# Patient Record
Sex: Female | Born: 1950
Health system: Southern US, Community
[De-identification: ages and names within clinical notes are randomized; demographics above are authoritative.]

## PROBLEM LIST (undated history)

## (undated) DIAGNOSIS — J45909 Unspecified asthma, uncomplicated: Secondary | ICD-10-CM

## (undated) DIAGNOSIS — D869 Sarcoidosis, unspecified: Secondary | ICD-10-CM

## (undated) DIAGNOSIS — T7840XA Allergy, unspecified, initial encounter: Secondary | ICD-10-CM

## (undated) DIAGNOSIS — F419 Anxiety disorder, unspecified: Secondary | ICD-10-CM

## (undated) DIAGNOSIS — J309 Allergic rhinitis, unspecified: Secondary | ICD-10-CM

## (undated) DIAGNOSIS — K219 Gastro-esophageal reflux disease without esophagitis: Secondary | ICD-10-CM

## (undated) DIAGNOSIS — I10 Essential (primary) hypertension: Secondary | ICD-10-CM

## (undated) HISTORY — DX: Unspecified asthma, uncomplicated: J45.909

## (undated) HISTORY — DX: Essential (primary) hypertension: I10

## (undated) HISTORY — PX: DEBRIDEMENT TOE: SUR397

## (undated) HISTORY — PX: TONSILLECTOMY: SUR1361

## (undated) HISTORY — DX: Gastro-esophageal reflux disease without esophagitis: K21.9

## (undated) HISTORY — PX: EXTERNAL FIXATION OF FINGER: SHX6745

## (undated) HISTORY — DX: Allergy, unspecified, initial encounter: T78.40XA

## (undated) HISTORY — DX: Sarcoidosis, unspecified: D86.9

## (undated) HISTORY — DX: Anxiety disorder, unspecified: F41.9

## (undated) HISTORY — DX: Allergic rhinitis, unspecified: J30.9

---

## 1998-09-06 ENCOUNTER — Emergency Department (HOSPITAL_COMMUNITY): Admission: EM | Admit: 1998-09-06 | Discharge: 1998-09-07 | Payer: Self-pay | Admitting: Emergency Medicine

## 1998-09-15 ENCOUNTER — Other Ambulatory Visit: Admission: RE | Admit: 1998-09-15 | Discharge: 1998-09-15 | Payer: Self-pay | Admitting: *Deleted

## 1999-09-14 ENCOUNTER — Other Ambulatory Visit: Admission: RE | Admit: 1999-09-14 | Discharge: 1999-09-14 | Payer: Self-pay | Admitting: *Deleted

## 2000-09-12 ENCOUNTER — Other Ambulatory Visit: Admission: RE | Admit: 2000-09-12 | Discharge: 2000-09-12 | Payer: Self-pay | Admitting: *Deleted

## 2001-09-11 ENCOUNTER — Other Ambulatory Visit: Admission: RE | Admit: 2001-09-11 | Discharge: 2001-09-11 | Payer: Self-pay | Admitting: *Deleted

## 2002-05-06 ENCOUNTER — Encounter: Payer: Self-pay | Admitting: Family Medicine

## 2002-05-06 ENCOUNTER — Encounter: Admission: RE | Admit: 2002-05-06 | Discharge: 2002-05-06 | Payer: Self-pay | Admitting: Family Medicine

## 2002-08-21 ENCOUNTER — Other Ambulatory Visit: Admission: RE | Admit: 2002-08-21 | Discharge: 2002-08-21 | Payer: Self-pay | Admitting: *Deleted

## 2003-08-22 ENCOUNTER — Other Ambulatory Visit: Admission: RE | Admit: 2003-08-22 | Discharge: 2003-08-22 | Payer: Self-pay | Admitting: *Deleted

## 2003-11-25 ENCOUNTER — Ambulatory Visit (HOSPITAL_COMMUNITY): Admission: RE | Admit: 2003-11-25 | Discharge: 2003-11-25 | Payer: Self-pay | Admitting: Internal Medicine

## 2003-11-25 IMAGING — CT CT PARANASAL SINUSES LIMITED
1 series · 16 of 20 positions shown, 20 images · non-contrast
Comparison: none

CLINICAL DATA: Sinusitis.  Facial pain.  Post nasal drip, coughing, dizziness.  
CT MAXILLOFACIAL LIMITED WITHOUT CONTRAST
2.5 mm collimated images were obtained with the patient in the coronal plane.  Bilateral air-fluid levels are seen in the maxillary sinuses consistent with acute sinusitis.  There is considerable fluid accumulation in the ethmoid air cells as well.  Sphenoid sinus is essentially clear.  The frontal sinus is hypoplastic but does not contain significant fluid accumulation.  
IMPRESSION
1.  Acute bilateral maxillary sinusitis.
2.  Moderate ethmoid sinus disease is noted as well.

[Series 3: recon 2: limited sinus prone · axial · 0.33mm/px · z∈[+137,+224]mm · 16 of 20 slices shown, 20 images]
[im 2/20  brain]
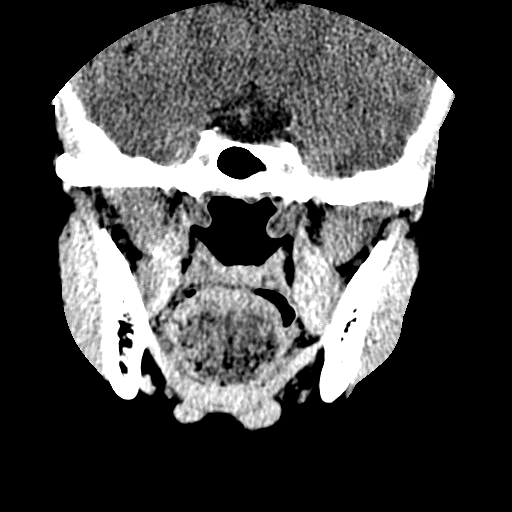
[im 2/20  bone]
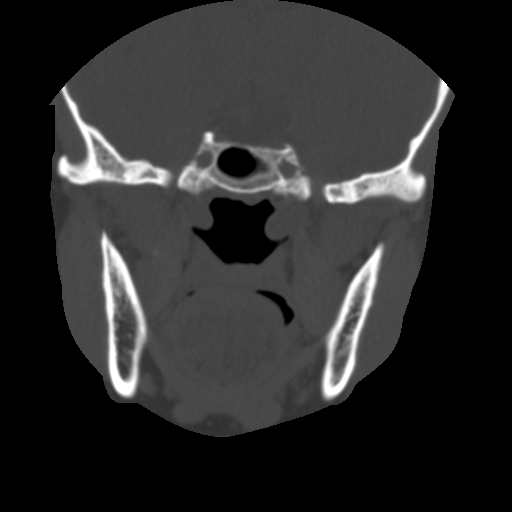
[im 3/20  bone]
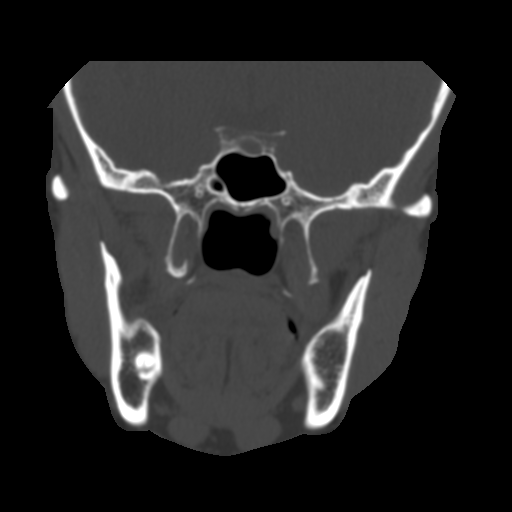
[im 4/20  bone]
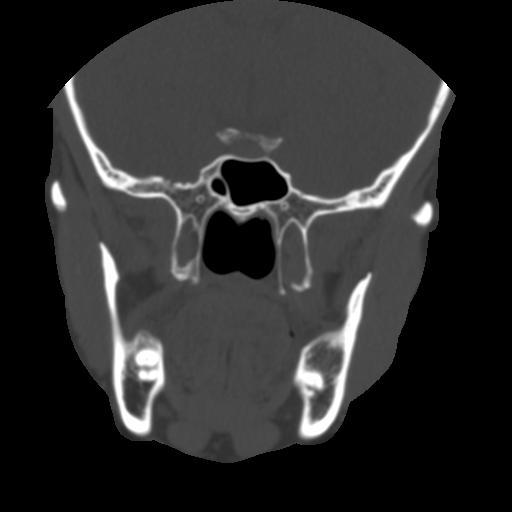
[im 5/20  bone]
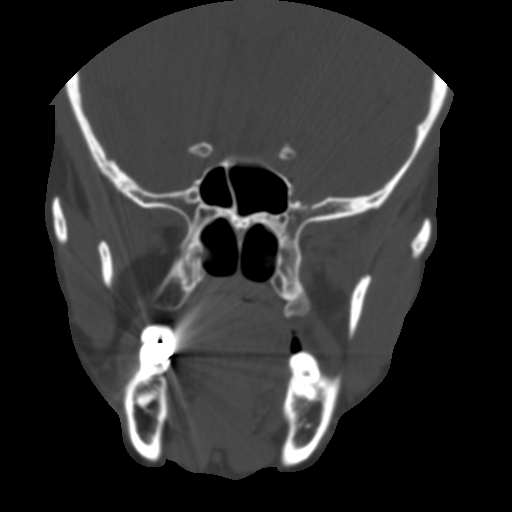
[im 7/20  brain]
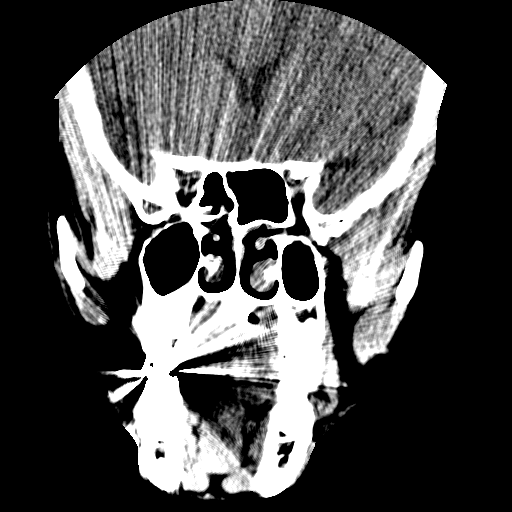
[im 7/20  bone]
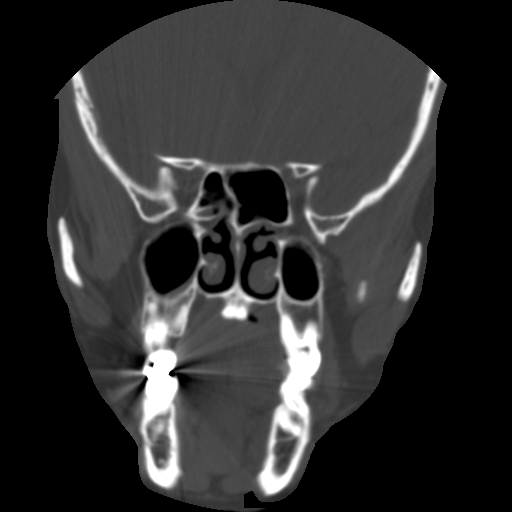
[im 8/20  bone]
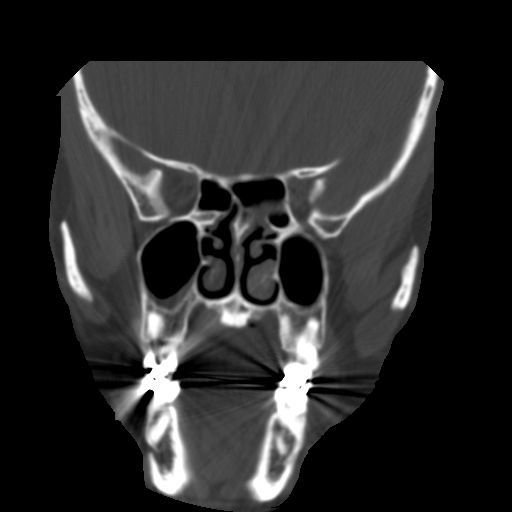
[im 9/20  bone]
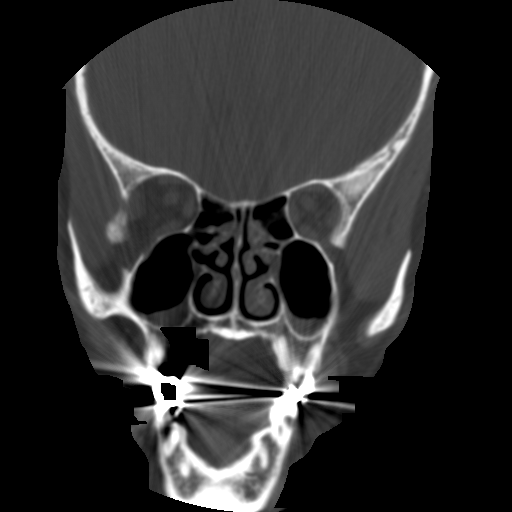
[im 10/20  bone]
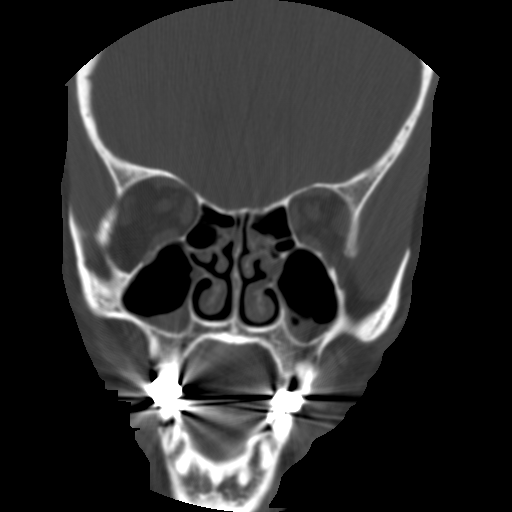
[im 11/20  brain]
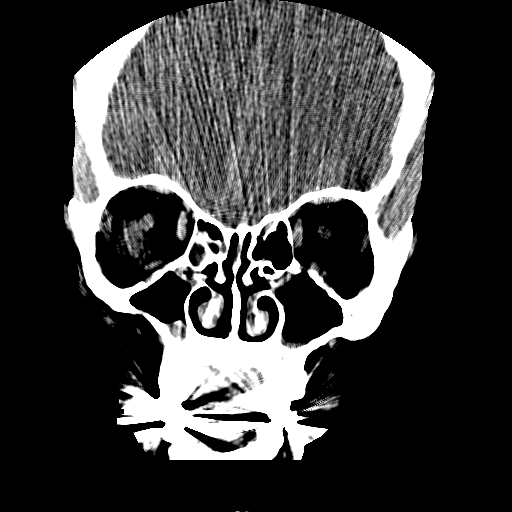
[im 11/20  bone]
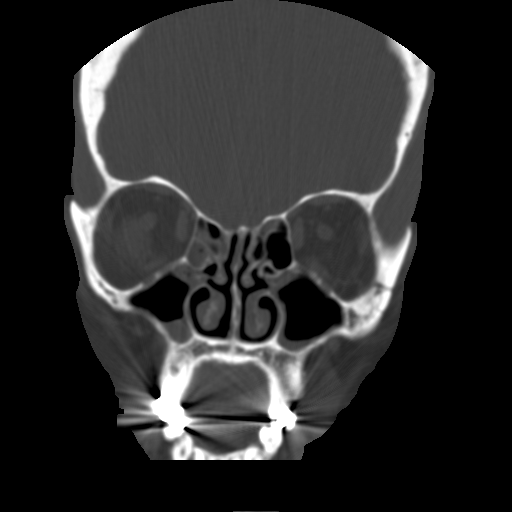
[im 12/20  bone]
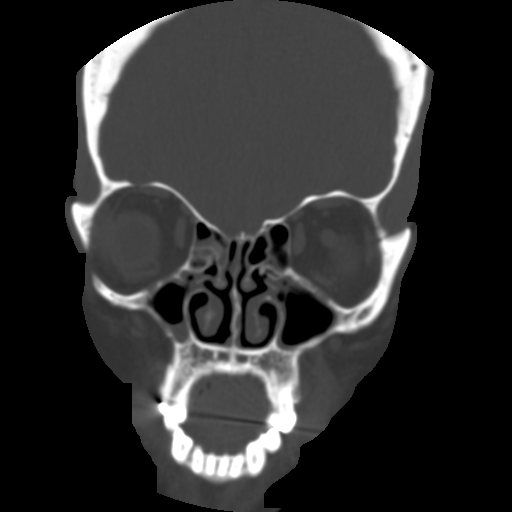
[im 13/20  bone]
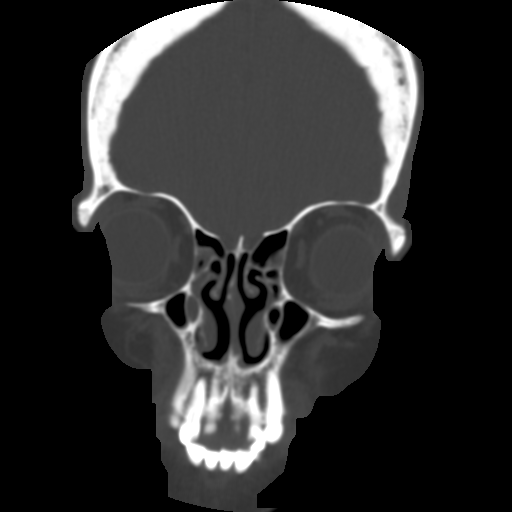
[im 14/20  bone]
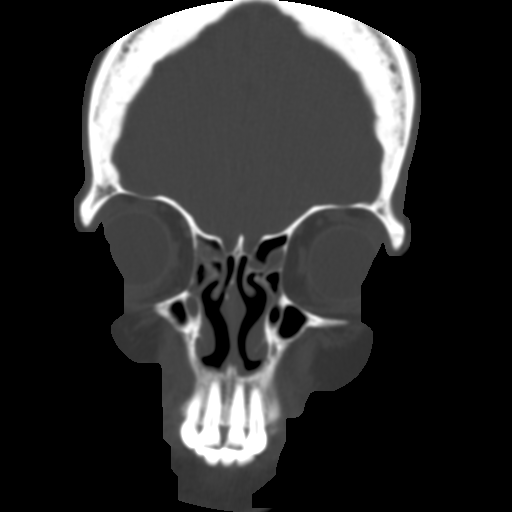
[im 16/20  brain]
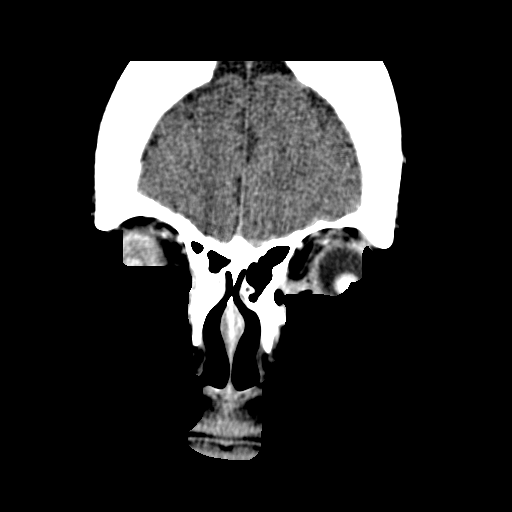
[im 16/20  bone]
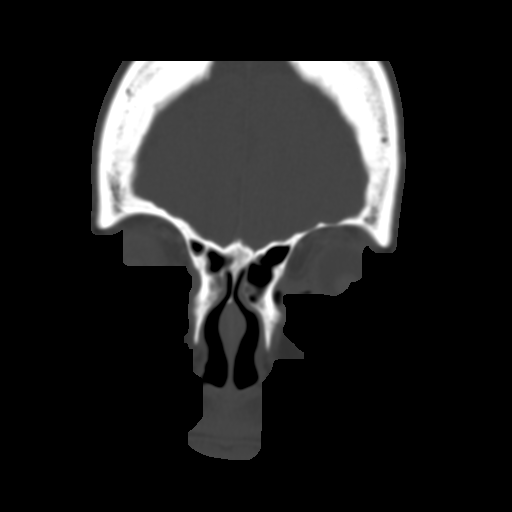
[im 17/20  bone]
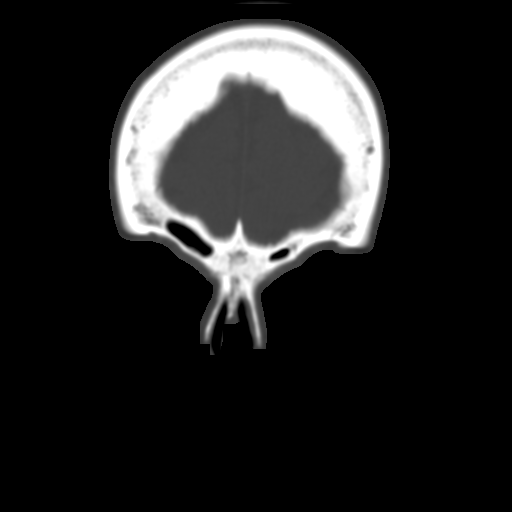
[im 18/20  bone]
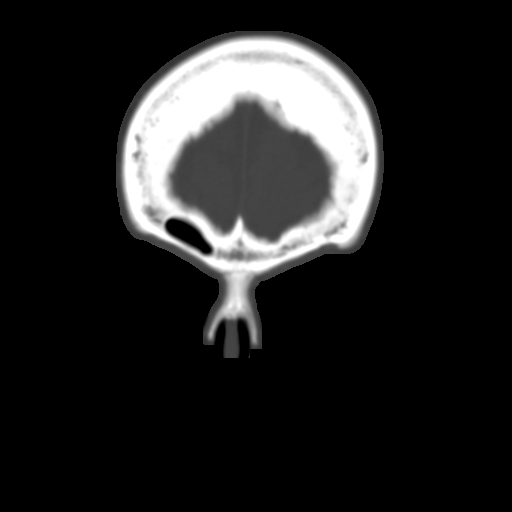
[im 19/20  bone]
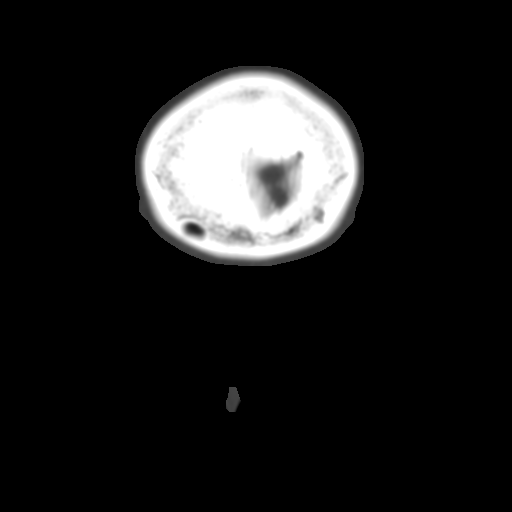

[16 of 20 positions shown; findings below may reference images not displayed]

## 2004-02-13 ENCOUNTER — Ambulatory Visit (HOSPITAL_COMMUNITY): Admission: RE | Admit: 2004-02-13 | Discharge: 2004-02-13 | Payer: Self-pay | Admitting: Internal Medicine

## 2004-03-27 ENCOUNTER — Ambulatory Visit: Payer: Self-pay | Admitting: Internal Medicine

## 2004-04-10 ENCOUNTER — Ambulatory Visit: Payer: Self-pay | Admitting: Internal Medicine

## 2004-07-09 ENCOUNTER — Ambulatory Visit: Payer: Self-pay | Admitting: Internal Medicine

## 2004-08-27 ENCOUNTER — Other Ambulatory Visit: Admission: RE | Admit: 2004-08-27 | Discharge: 2004-08-27 | Payer: Self-pay | Admitting: *Deleted

## 2004-09-21 ENCOUNTER — Encounter: Admission: RE | Admit: 2004-09-21 | Discharge: 2004-12-20 | Payer: Self-pay | Admitting: Neurology

## 2004-10-09 ENCOUNTER — Ambulatory Visit: Payer: Self-pay | Admitting: Internal Medicine

## 2005-01-28 ENCOUNTER — Ambulatory Visit: Payer: Self-pay | Admitting: Internal Medicine

## 2005-02-03 ENCOUNTER — Ambulatory Visit: Payer: Self-pay | Admitting: Internal Medicine

## 2005-09-07 ENCOUNTER — Other Ambulatory Visit: Admission: RE | Admit: 2005-09-07 | Discharge: 2005-09-07 | Payer: Self-pay | Admitting: *Deleted

## 2006-07-18 ENCOUNTER — Ambulatory Visit: Payer: Self-pay | Admitting: Gastroenterology

## 2006-07-28 ENCOUNTER — Ambulatory Visit: Payer: Self-pay | Admitting: Gastroenterology

## 2006-08-25 ENCOUNTER — Other Ambulatory Visit: Admission: RE | Admit: 2006-08-25 | Discharge: 2006-08-25 | Payer: Self-pay | Admitting: *Deleted

## 2007-08-28 ENCOUNTER — Other Ambulatory Visit: Admission: RE | Admit: 2007-08-28 | Discharge: 2007-08-28 | Payer: Self-pay | Admitting: Gynecology

## 2008-06-28 ENCOUNTER — Encounter: Admission: RE | Admit: 2008-06-28 | Discharge: 2008-06-28 | Payer: Self-pay | Admitting: Family Medicine

## 2008-06-28 IMAGING — CR DG FINGER THUMB 2+V*R*
3 series · 3 of 3 positions shown · non-contrast
Comparison: None

CLINICAL DATA: Pain, no acute injury

RIGHT THUMB 2+V

[x finger pa right]
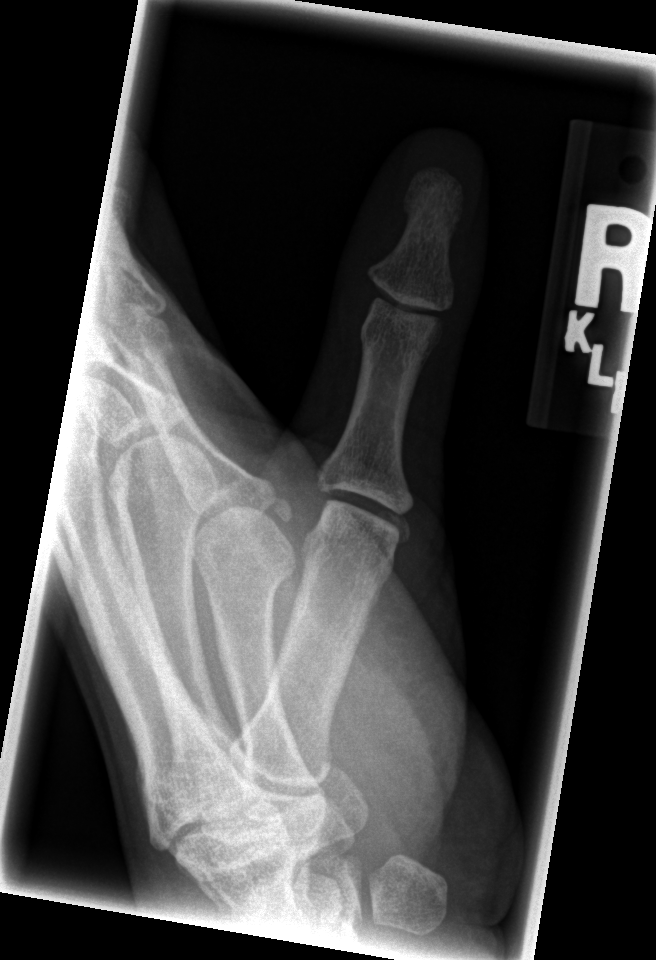

[x finger obl. right]
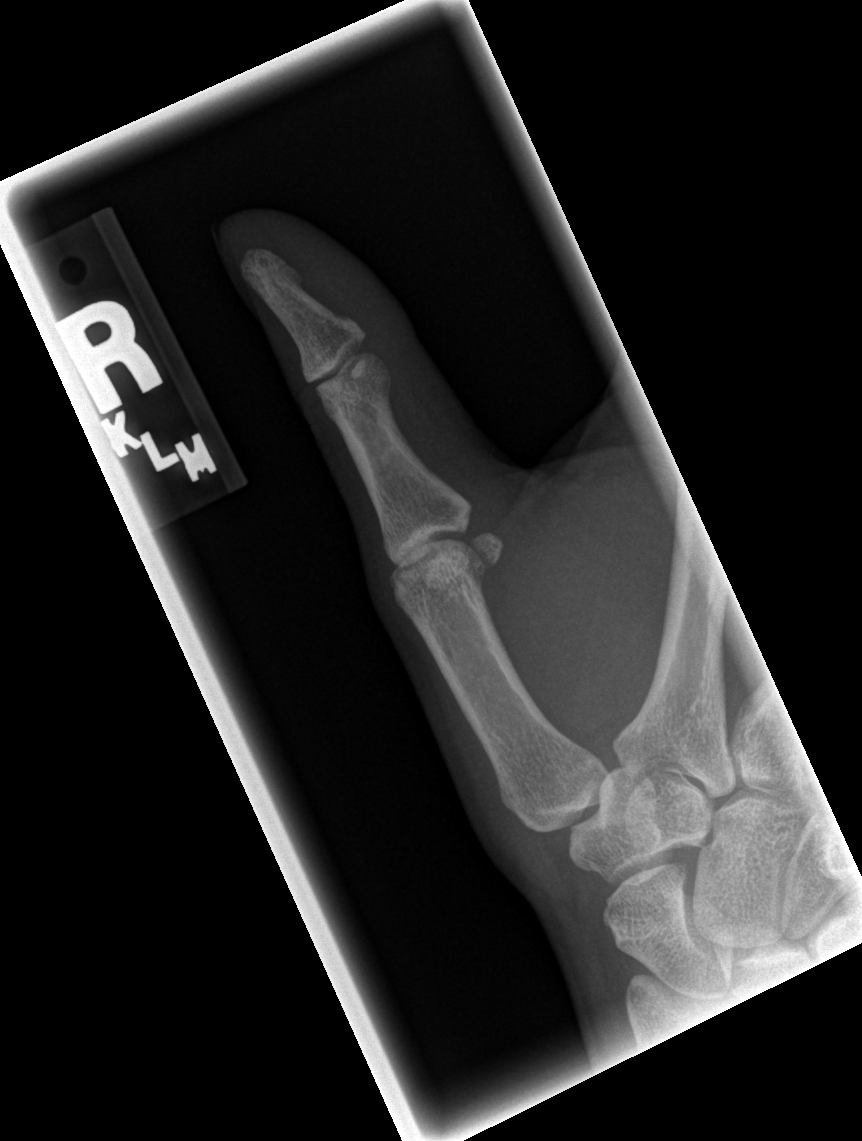

[x finger lateral right]
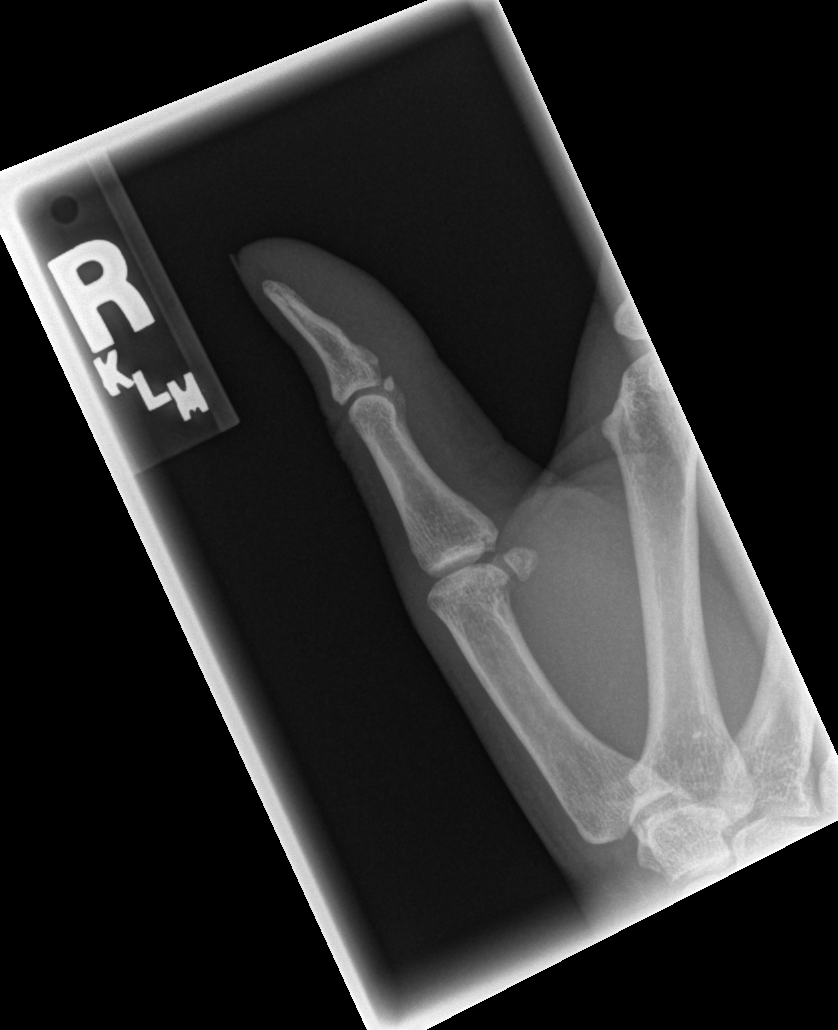

[3 of 3 positions shown; findings below may reference images not displayed]

FINDINGS: Joint spaces are normal.  No erosion is seen.  No
fracture is noted.  Alignment is normal.
IMPRESSION: Negative right thumb.

## 2008-08-08 ENCOUNTER — Ambulatory Visit (HOSPITAL_BASED_OUTPATIENT_CLINIC_OR_DEPARTMENT_OTHER): Admission: RE | Admit: 2008-08-08 | Discharge: 2008-08-08 | Payer: Self-pay | Admitting: Orthopedic Surgery

## 2009-01-24 ENCOUNTER — Ambulatory Visit (HOSPITAL_BASED_OUTPATIENT_CLINIC_OR_DEPARTMENT_OTHER): Admission: RE | Admit: 2009-01-24 | Discharge: 2009-01-24 | Payer: Self-pay | Admitting: Orthopedic Surgery

## 2009-04-29 ENCOUNTER — Ambulatory Visit: Payer: Self-pay | Admitting: Internal Medicine

## 2009-04-29 DIAGNOSIS — J209 Acute bronchitis, unspecified: Secondary | ICD-10-CM | POA: Insufficient documentation

## 2009-04-29 DIAGNOSIS — J310 Chronic rhinitis: Secondary | ICD-10-CM | POA: Insufficient documentation

## 2009-04-29 HISTORY — DX: Acute bronchitis, unspecified: J20.9

## 2009-04-29 IMAGING — CR DG CHEST 2V
2 series · 2 of 2 positions shown · non-contrast
Comparison: Digitized study dated [DATE]

CLINICAL DATA: Cough, shortness of breath, chest pain.  History of
hypertension, asthma.

CHEST - 2 VIEW

[view not recorded (1 of 2)]
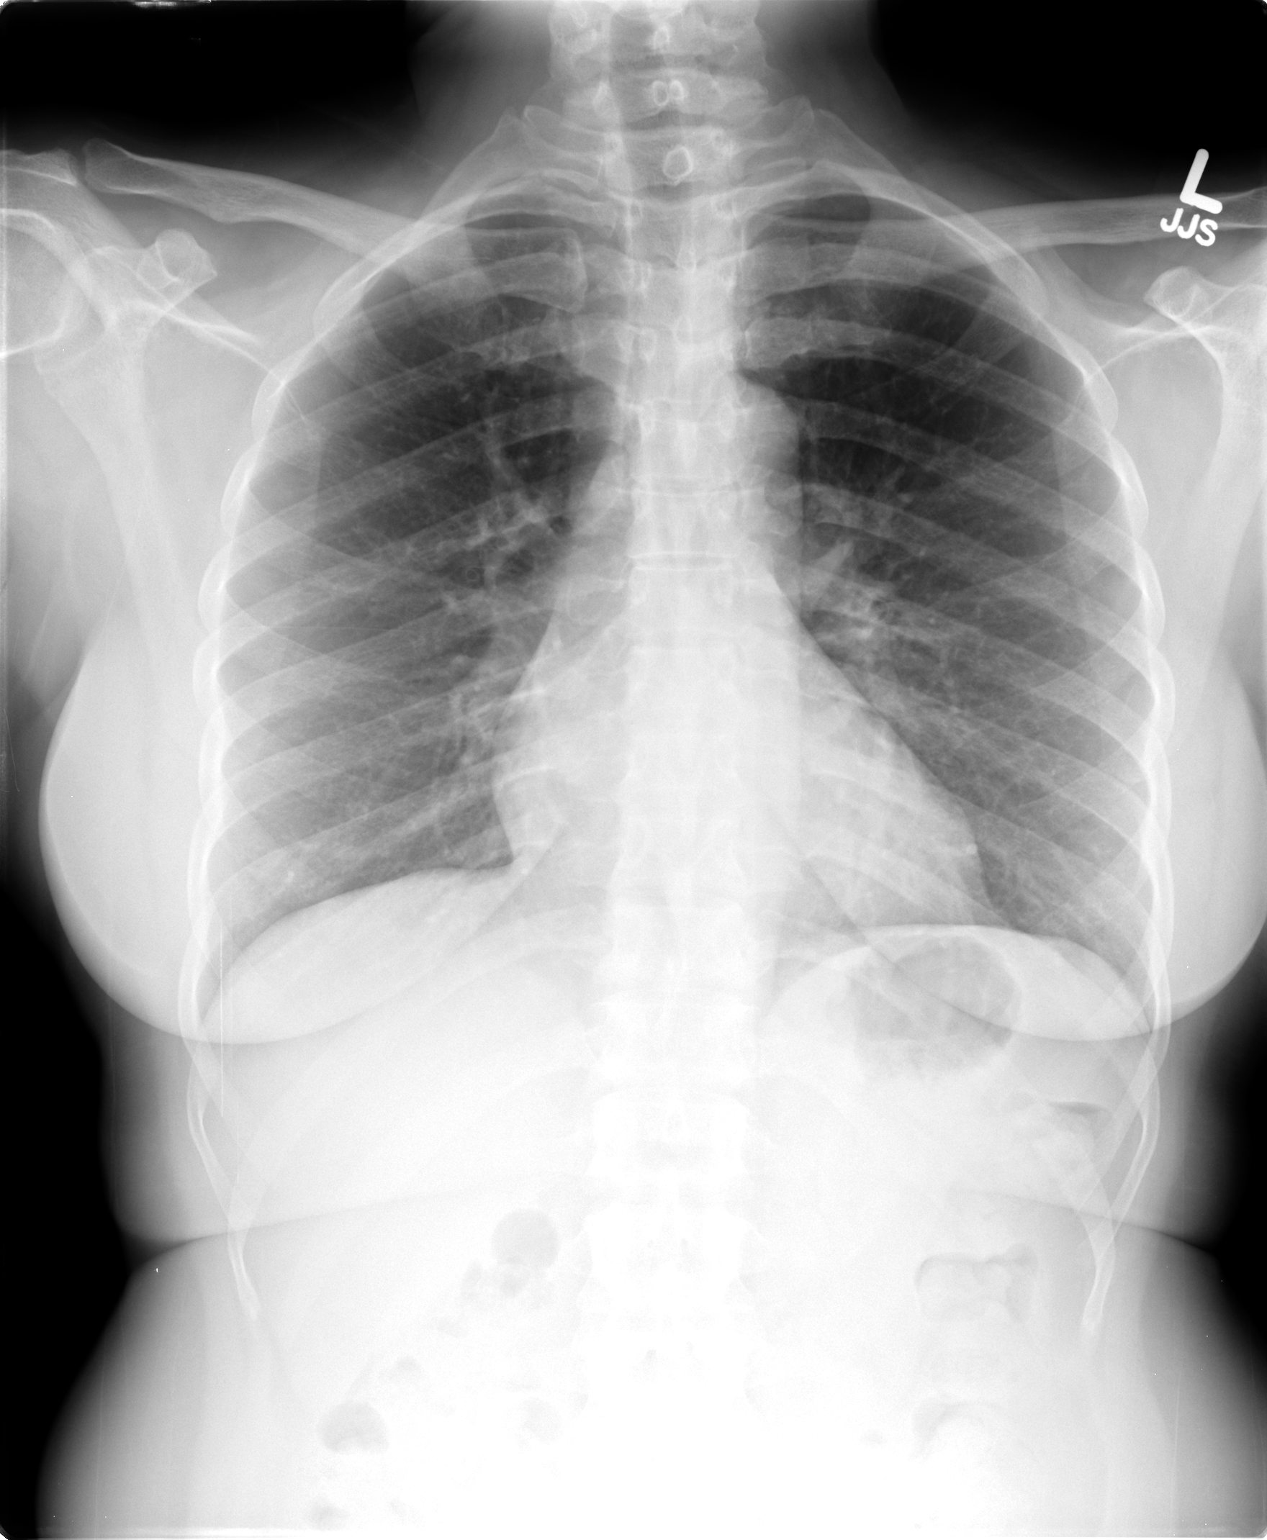

[view not recorded (2 of 2)]
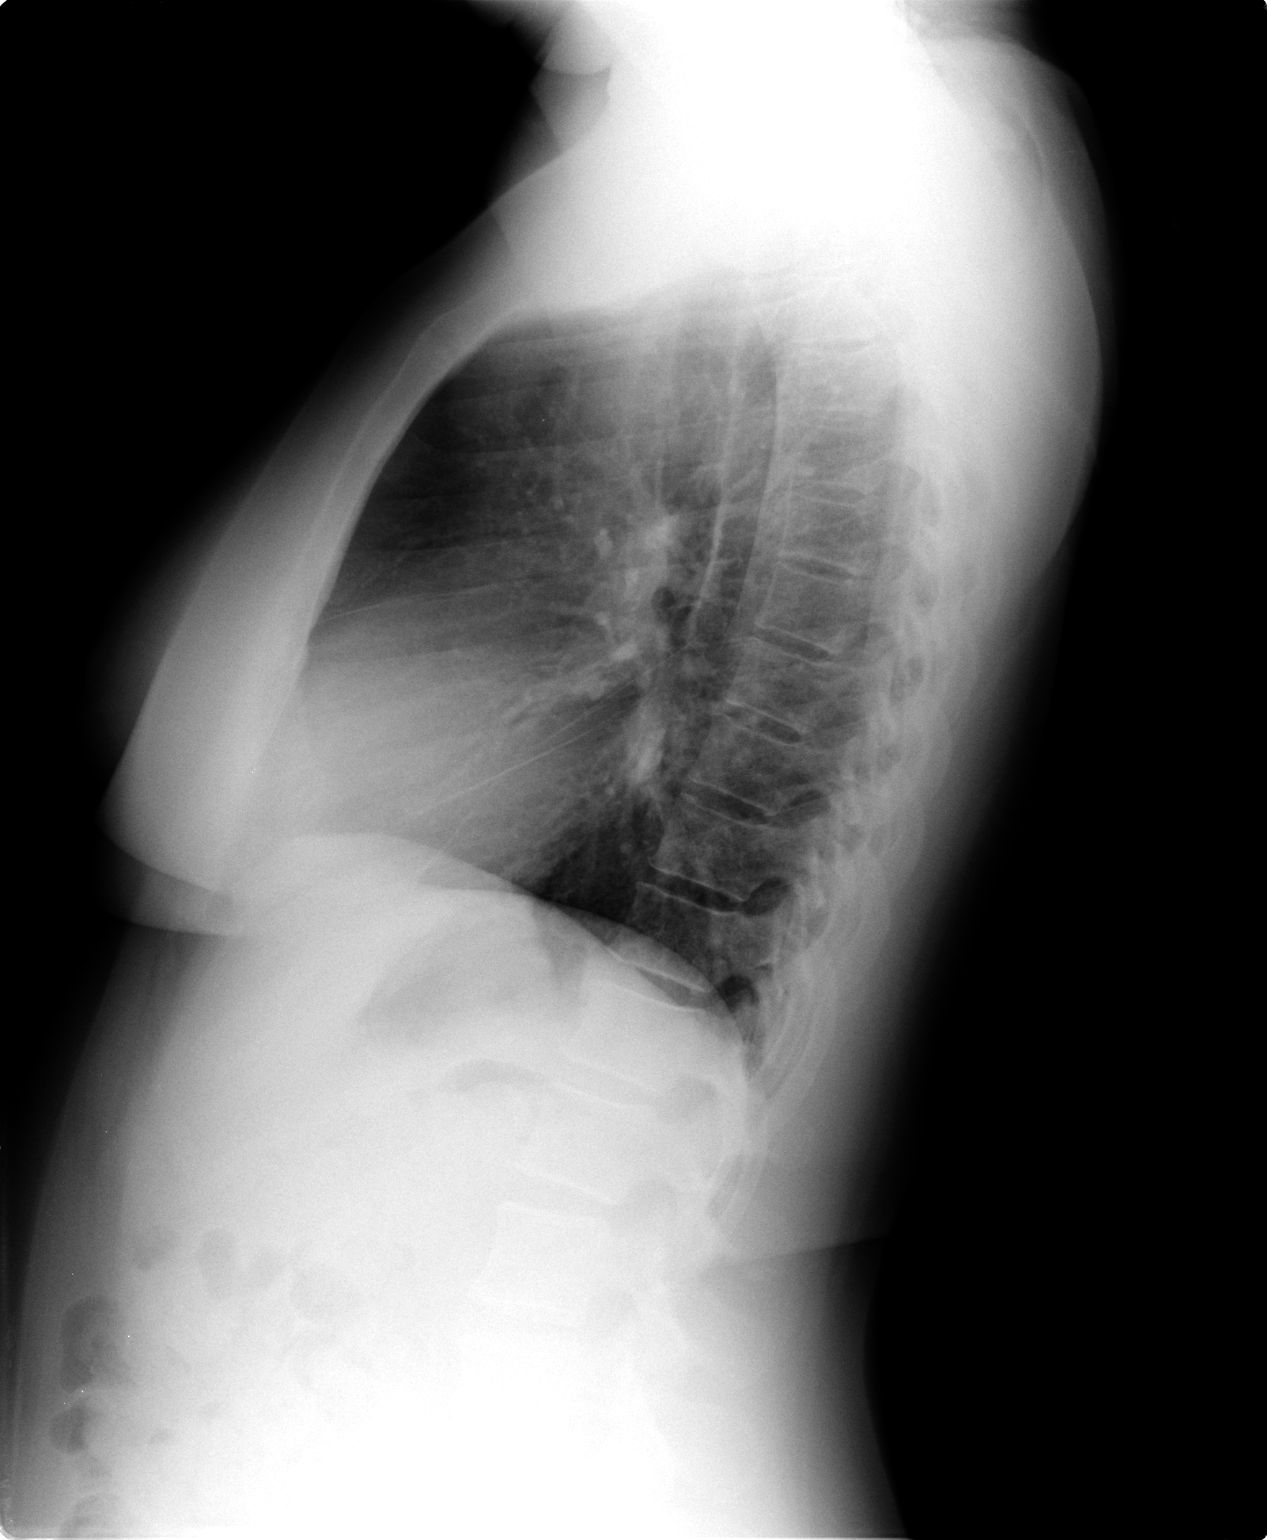

[2 of 2 positions shown; findings below may reference images not displayed]

FINDINGS: Heart size is normal.  There are no focal consolidations
or pleural effusions.  No evidence for pulmonary edema.  Visualized
bony structures have a normal appearance.
IMPRESSION: Normal chest.

## 2009-05-05 ENCOUNTER — Telehealth (INDEPENDENT_AMBULATORY_CARE_PROVIDER_SITE_OTHER): Payer: Self-pay | Admitting: *Deleted

## 2009-05-05 DIAGNOSIS — J309 Allergic rhinitis, unspecified: Secondary | ICD-10-CM | POA: Insufficient documentation

## 2009-05-06 LAB — CONVERTED CEMR LAB: Angiotensin 1 Converting Enzyme: 56 units/L (ref 9–67)

## 2009-05-07 ENCOUNTER — Telehealth (INDEPENDENT_AMBULATORY_CARE_PROVIDER_SITE_OTHER): Payer: Self-pay | Admitting: *Deleted

## 2009-05-20 ENCOUNTER — Ambulatory Visit: Payer: Self-pay | Admitting: Vascular Surgery

## 2009-05-20 ENCOUNTER — Ambulatory Visit (HOSPITAL_COMMUNITY): Admission: RE | Admit: 2009-05-20 | Discharge: 2009-05-20 | Payer: Self-pay | Admitting: Sports Medicine

## 2009-05-20 ENCOUNTER — Encounter (INDEPENDENT_AMBULATORY_CARE_PROVIDER_SITE_OTHER): Payer: Self-pay | Admitting: Sports Medicine

## 2009-05-22 ENCOUNTER — Ambulatory Visit: Payer: Self-pay | Admitting: Internal Medicine

## 2009-06-19 ENCOUNTER — Ambulatory Visit: Payer: Self-pay | Admitting: Internal Medicine

## 2009-07-18 ENCOUNTER — Ambulatory Visit: Payer: Self-pay | Admitting: Internal Medicine

## 2009-07-18 DIAGNOSIS — J45909 Unspecified asthma, uncomplicated: Secondary | ICD-10-CM

## 2009-08-12 ENCOUNTER — Ambulatory Visit: Payer: Self-pay | Admitting: Internal Medicine

## 2009-08-12 IMAGING — CT CT PARANASAL SINUSES LIMITED
1 series · 16 of 24 positions shown, 20 images · non-contrast
Comparison: [DATE].

CLINICAL DATA: 58-year-old female with frontal headache, productive
cough, postnasal drip.

CT PARANASAL SINUS LIMITED WITHOUT CONTRAST
TECHNIQUE: Multidetector CT images of the paranasal sinuses were
obtained in a single plane without contrast.

[Series 3: ltd sinus 3.0 h30s · axial · 0.20mm/px · z∈[+1286,+1384]mm · 16 of 24 slices shown, 20 images]
[im 2/24  brain]
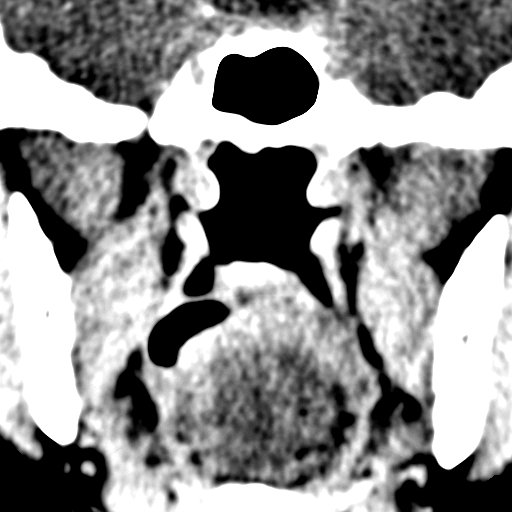
[im 2/24  bone]
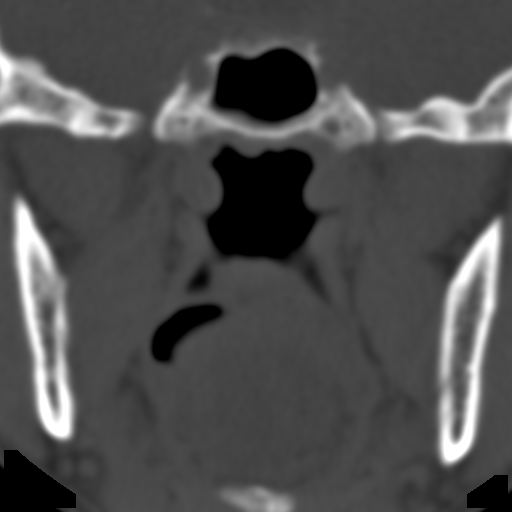
[im 4/24  bone]
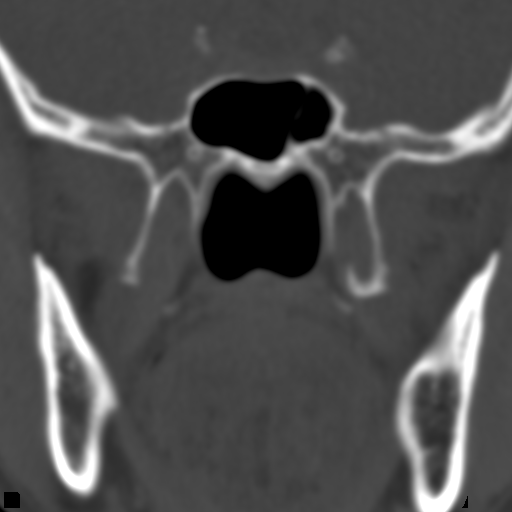
[im 5/24  bone]
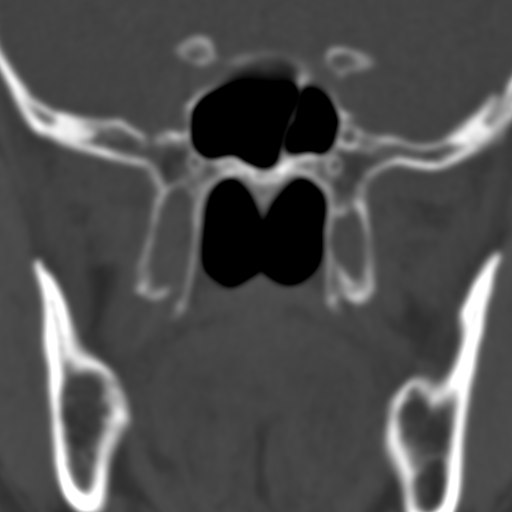
[im 6/24  bone]
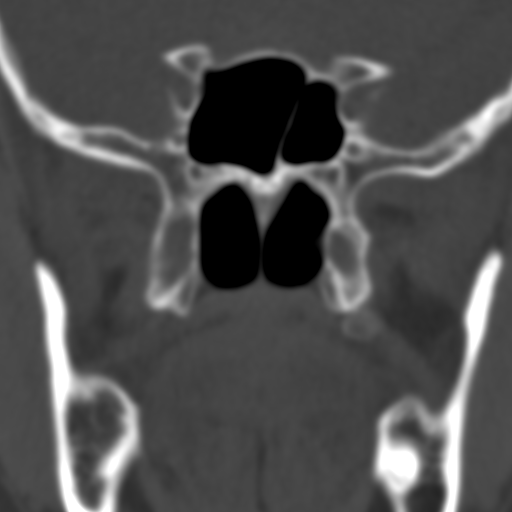
[im 8/24  brain]
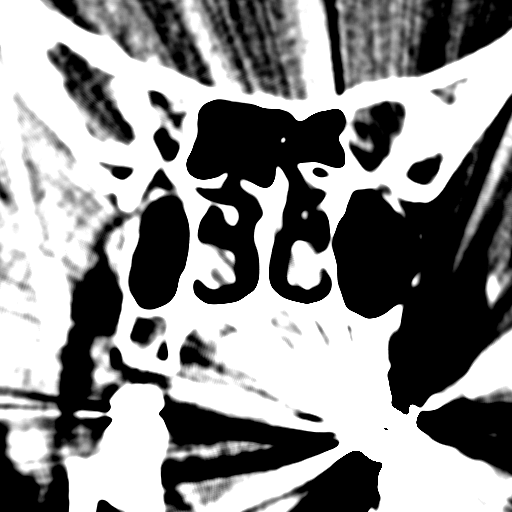
[im 8/24  bone]
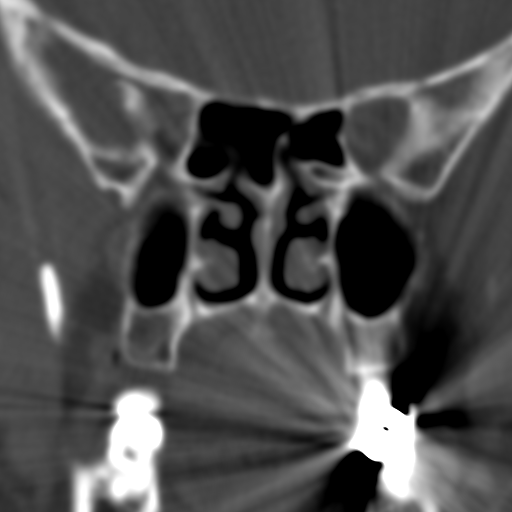
[im 9/24  bone]
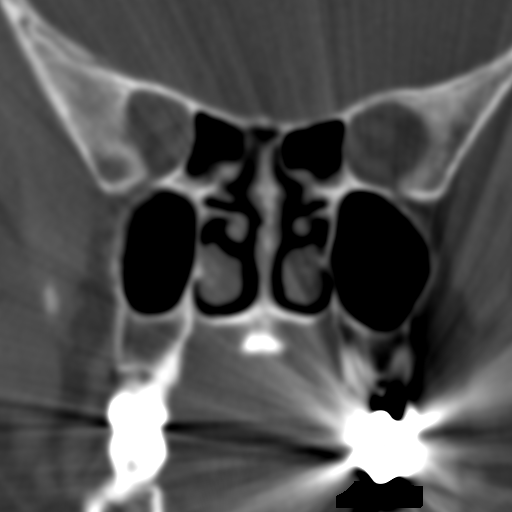
[im 10/24  bone]
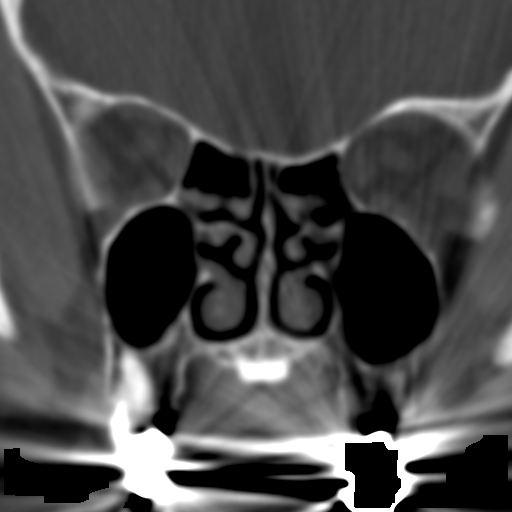
[im 12/24  bone]
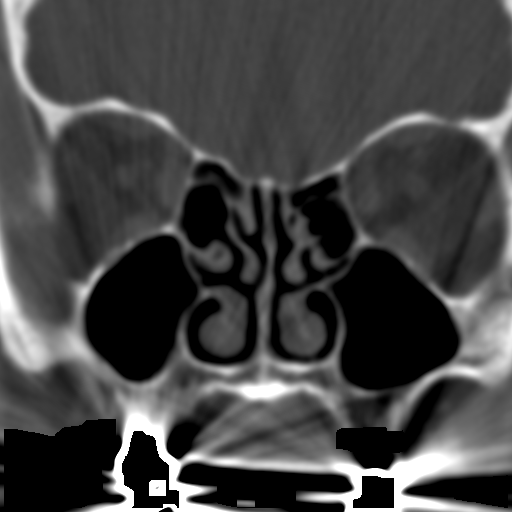
[im 13/24  brain]
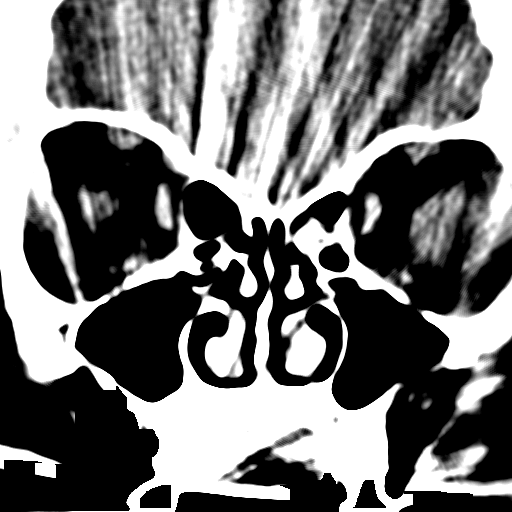
[im 13/24  bone]
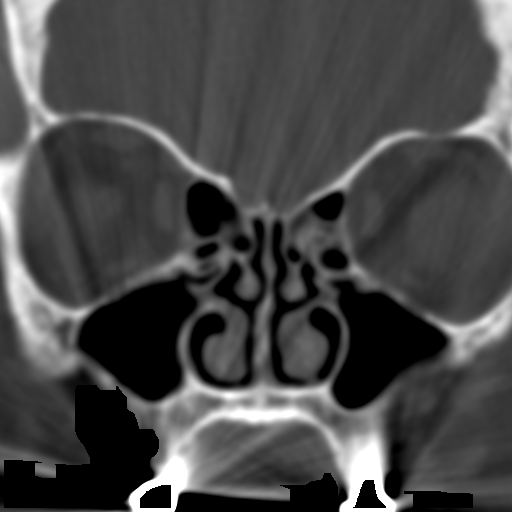
[im 15/24  bone]
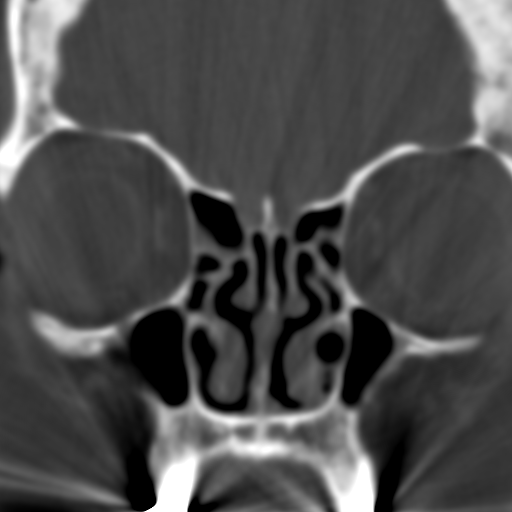
[im 16/24  bone]
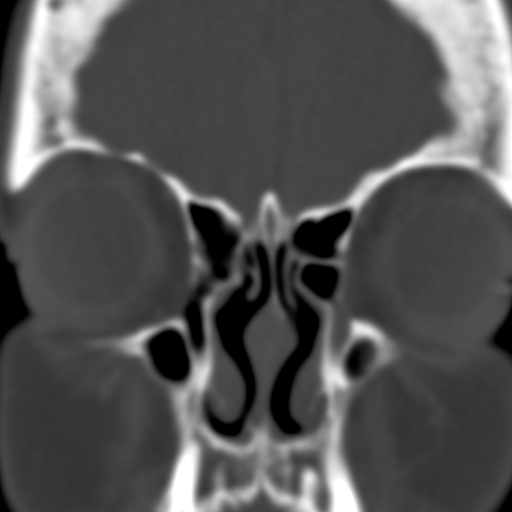
[im 17/24  bone]
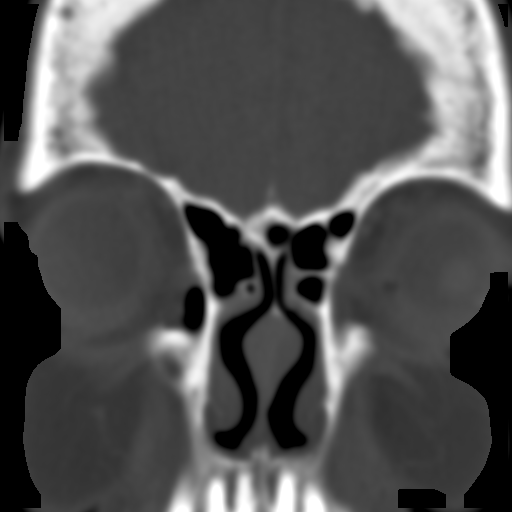
[im 19/24  brain]
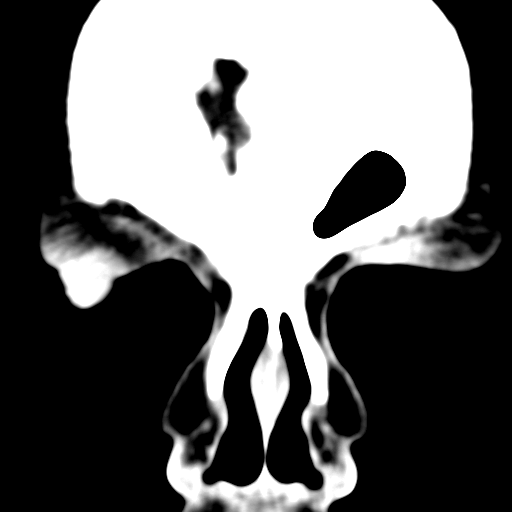
[im 19/24  bone]
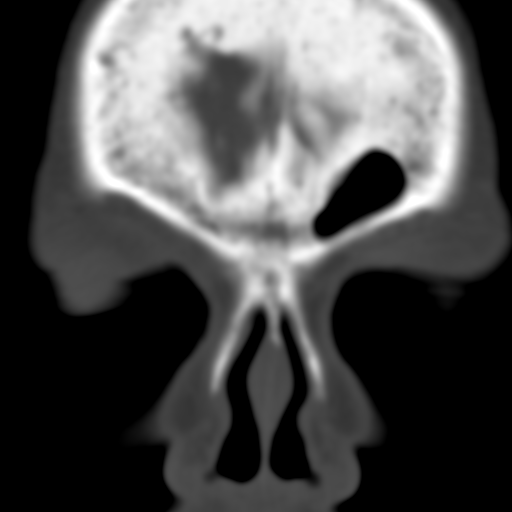
[im 20/24  bone]
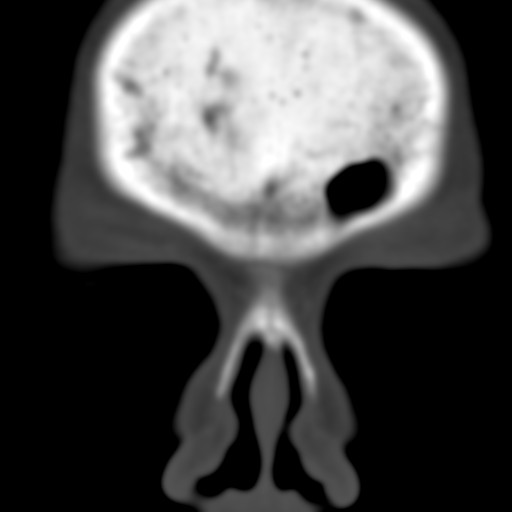
[im 21/24  bone]
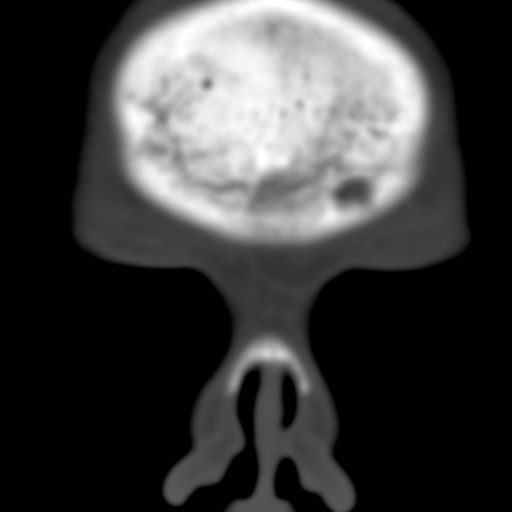
[im 23/24  bone]
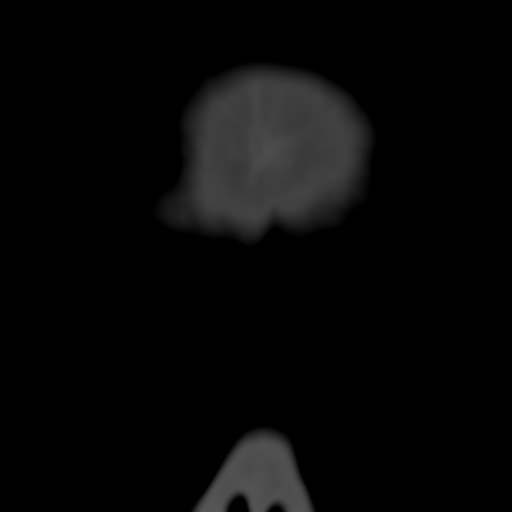

[16 of 24 positions shown; findings below may reference images not displayed]

FINDINGS: Grossly negative visualized noncontrast brain parenchyma.
Orbit and face soft tissues are within normal limits.

The paranasal sinuses are clear.  Ostiomeatal complexes are patent.
Visualized nasal cavity and nasopharynx are within normal limits.
No acute osseous abnormality identified.
IMPRESSION: Normal paranasal sinuses.

## 2009-08-14 ENCOUNTER — Telehealth: Payer: Self-pay | Admitting: Internal Medicine

## 2009-08-15 ENCOUNTER — Telehealth: Payer: Self-pay | Admitting: Internal Medicine

## 2009-08-15 LAB — CONVERTED CEMR LAB
Basophils Absolute: 0 10*3/uL (ref 0.0–0.1)
Basophils Relative: 0.4 % (ref 0.0–3.0)
Eosinophils Absolute: 0.3 10*3/uL (ref 0.0–0.7)
Eosinophils Relative: 4.8 % (ref 0.0–5.0)
HCT: 37.9 % (ref 36.0–46.0)
Hemoglobin: 13.2 g/dL (ref 12.0–15.0)
Lymphocytes Relative: 44.7 % (ref 12.0–46.0)
Lymphs Abs: 3.1 10*3/uL (ref 0.7–4.0)
MCV: 90.1 fL (ref 78.0–100.0)
Monocytes Absolute: 0.5 10*3/uL (ref 0.1–1.0)
Neutro Abs: 3 10*3/uL (ref 1.4–7.7)
Neutrophils Relative %: 43.3 % (ref 43.0–77.0)
RBC: 4.21 M/uL (ref 3.87–5.11)
WBC: 6.8 10*3/uL (ref 4.5–10.5)

## 2009-09-09 ENCOUNTER — Ambulatory Visit: Payer: Self-pay | Admitting: Internal Medicine

## 2009-09-18 ENCOUNTER — Telehealth (INDEPENDENT_AMBULATORY_CARE_PROVIDER_SITE_OTHER): Payer: Self-pay | Admitting: *Deleted

## 2009-09-23 ENCOUNTER — Ambulatory Visit: Payer: Self-pay | Admitting: Internal Medicine

## 2009-10-09 ENCOUNTER — Ambulatory Visit: Payer: Self-pay | Admitting: Internal Medicine

## 2009-10-09 IMAGING — CR DG CHEST 2V
2 series · 2 of 2 positions shown · non-contrast
Comparison: [DATE]

CLINICAL DATA: Cough.

CHEST - 2 VIEW

[view not recorded (1 of 2)]
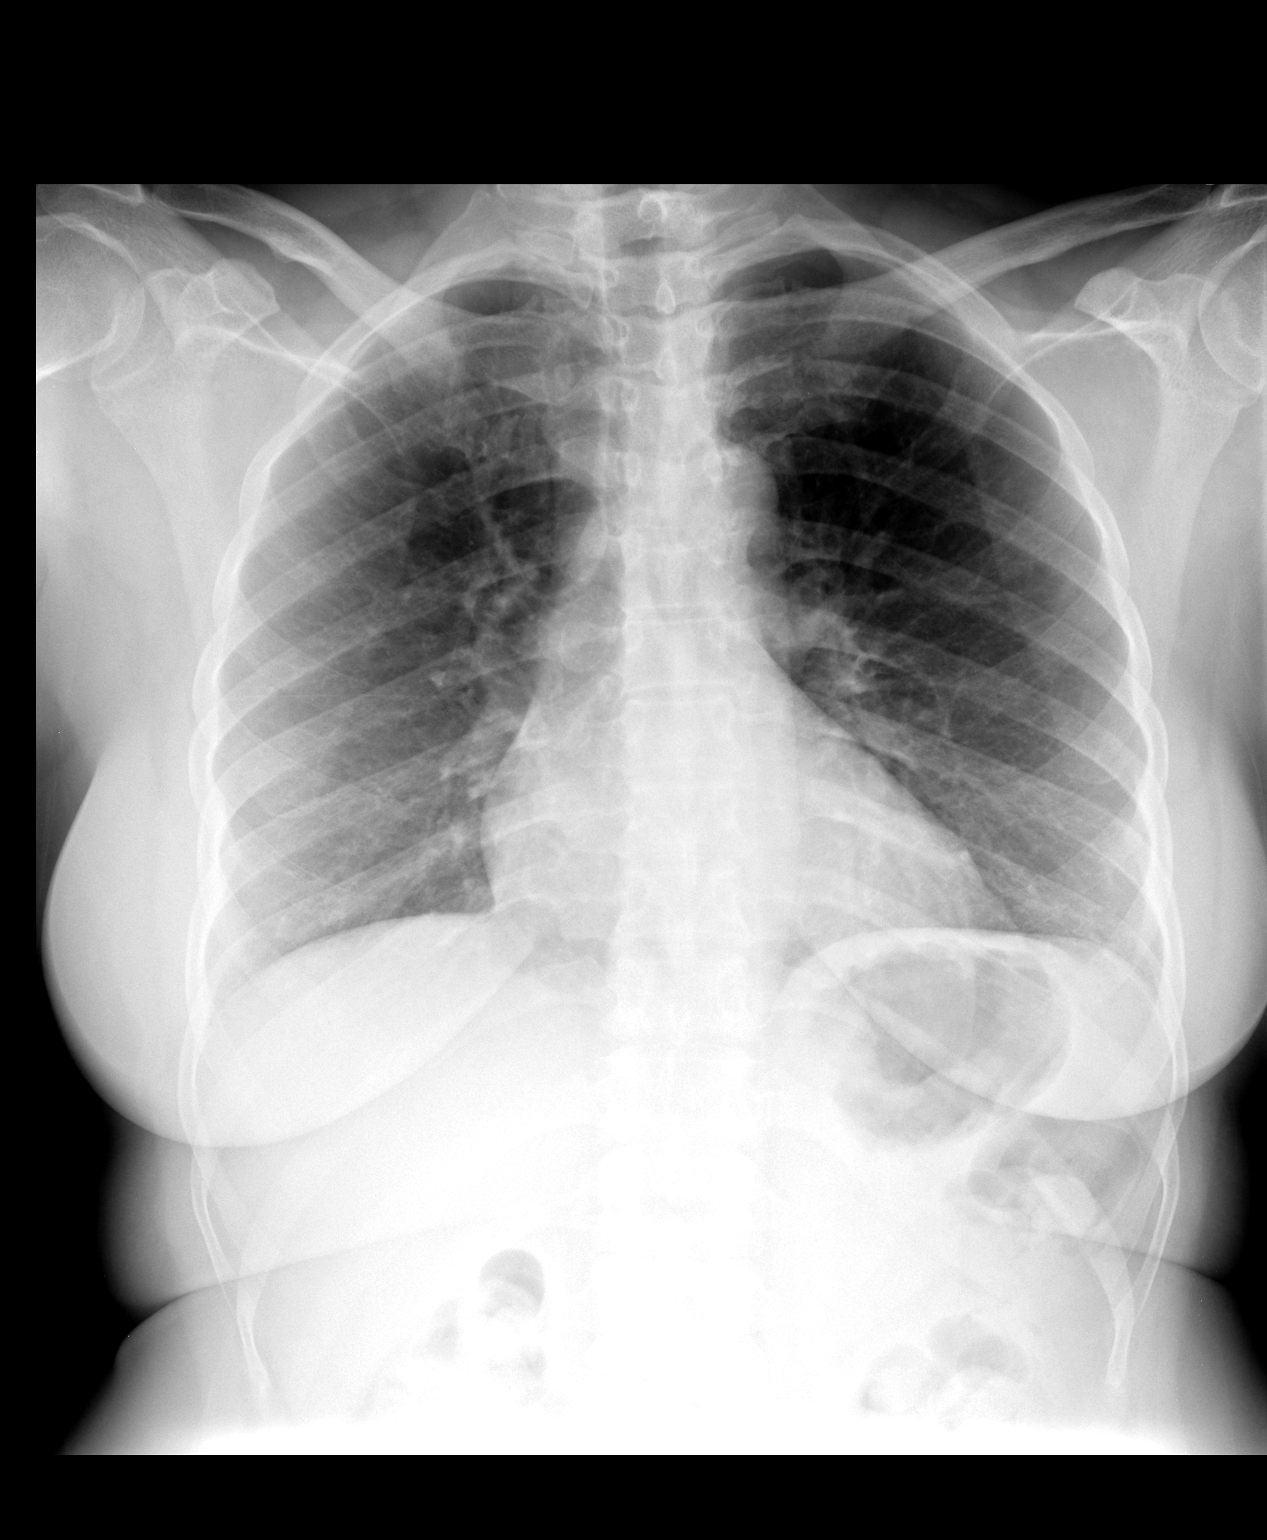

[view not recorded (2 of 2)]
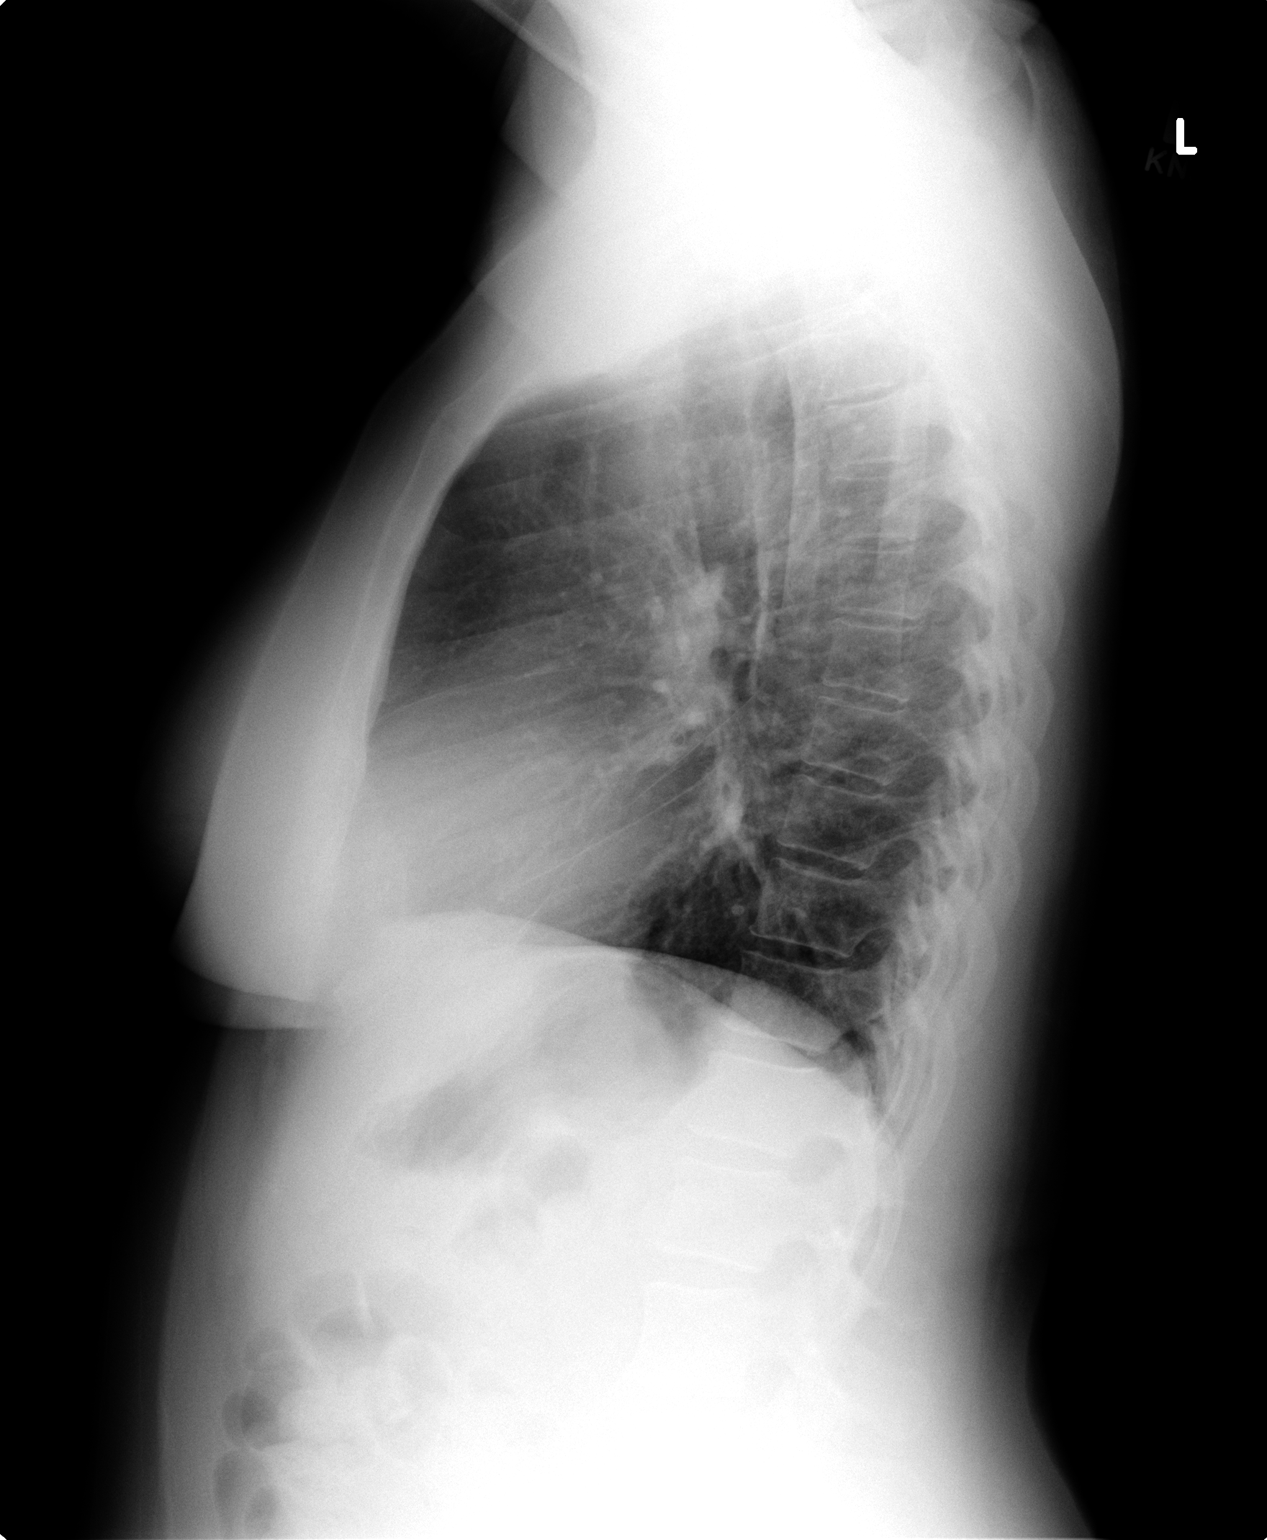

[2 of 2 positions shown; findings below may reference images not displayed]

FINDINGS: Trachea is midline.  Heart size stable.  Lungs are clear.
No pleural fluid.
IMPRESSION: No acute findings.

## 2009-10-10 DIAGNOSIS — R05 Cough: Secondary | ICD-10-CM

## 2009-10-30 ENCOUNTER — Ambulatory Visit: Payer: Self-pay | Admitting: Internal Medicine

## 2009-12-22 ENCOUNTER — Ambulatory Visit: Payer: Self-pay | Admitting: Internal Medicine

## 2009-12-25 ENCOUNTER — Telehealth: Payer: Self-pay | Admitting: Internal Medicine

## 2010-04-13 ENCOUNTER — Ambulatory Visit
Admission: RE | Admit: 2010-04-13 | Discharge: 2010-04-13 | Payer: Self-pay | Source: Home / Self Care | Attending: Internal Medicine | Admitting: Internal Medicine

## 2010-04-14 NOTE — Assessment & Plan Note (Signed)
Summary: COUGH/ SINUS INFECTION///KP   Primary Provider/Referring Provider:  Jeanmarie Hubert  CC:  Acute Visit.  Pt c/o prod cough with clear mucus and HA and SOB when coughing.  Pt states these symptoms have been going on since January but cough has gotten worse x 2months.  Pt also c/o " pains in chest and ears" x 2-3 wks.  .  History of Present Illness: History of Present Illness: April 29, 2009- Rhinitis, bronchitis, GERD 60 yoF never smoker, last seen 2006. Comes now to re-establish. Had flu vax. For 31/2 months has had cough and postnasal drip that started with a cold. PA at the ENT office took her off fexofenadine and gave Nasonex which hurts when sprayed in her nose. Had sinus Xray "not my sinuses".Frontal headache, sorethroat, no fever. Variable cough, sometimes with wheeze or deep, with clear mucus. Cough is keeping her from going out. She has a long hx of recurrent sinus and cough. Her eye doctor told her she has sarcoid in her eyes.  Hx pansinusiis on CT 2005. Lifelong recurrent rhinosinusitis and bronchitis. Skin test positive, was on allergy vaccine around 1999.  May 22, 2009- Rhinits, bronchitis, GERD Cefdinir gave diarrhea for 2 weeks. She is better but still feels something in throat and notes some cough. Denies fever, nodes, sore throat, nodes, wheeze or discolored sputum. Throat sensation started with the rest of her bronchitis about 3 months ago. Nasonex "hurts". Neti pot helps some.  June 19, 2009- Rhinitis, bronchitis, GERD,  Sarcoid/eye Cough is better- still some wheeze but not bad. She can tell that pollen bothers her if outside. Loratadine has helped her nose. Since finsihing cefdinir nothing seems infected.. Occasional frontal headache.  Jul 18, 2009- Rhinitis, bronchitis, GERD Over past 3 weeks has had gradually worsening cough, bitemporal headache, tussive anterior chest pain, clear mucus from nose and chest. Denies fever, sore throat, nodes, rash, purulence, blood  or ankle edema.     Current Medications (verified): 1)  Toprol Xl 50 Mg Xr24h-Tab (Metoprolol Succinate) .... Take 1 1/2 By Mouth Once Daily 2)  Aspirin 81 Mg Tbec (Aspirin) .... Take 1 By Mouth Once Daily 3)  Multivitamins  Tabs (Multiple Vitamin) .... Take 1 By Mouth Once Daily 4)  Fish Oil 1200 Mg Caps (Omega-3 Fatty Acids) .... Take 1 By Mouth Once Daily 5)  Loratadine 10 Mg Tabs (Loratadine) .... Take 1 By Mouth Once Daily 6)  Proair Hfa 108 (90 Base) Mcg/act Aers (Albuterol Sulfate) .... 2 Puffs Four Times A Day As Needed 7)  Astepro 0.15 % Soln (Azelastine Hcl) .Marland Kitchen.. 1-2 Puffs Each Nostril Up To Twice Daily As Needed 8)  Glucosamine Chondroit-Collagen  Caps (Glucos-Chondroit-Collag-Hyal) .... Once Daily 9)  Fexofenadine Hcl 180 Mg Tabs (Fexofenadine Hcl) .... Once Daily  Allergies (verified): 1)  ! * Cefdinir 2)  ! Sulfa  Past History:  Past Medical History: Last updated: 04/29/2009 Allergic Rhinitis Rhinosinusitis bronchitis  Past Surgical History: Last updated: 04/29/2009 None  Family History: Last updated: 04/29/2009 Father- unknown health status Mother- alive, asthma/ bronchitis, HTN, CVA Daughter- allergies  Social History: Last updated: 04/29/2009 Patient never smoked.  Retired Runner, broadcasting/film/video  Risk Factors: Smoking Status: never (04/29/2009)  Review of Systems      See HPI       The patient complains of chest pain, dyspnea on exertion, prolonged cough, and headaches.  The patient denies anorexia, fever, weight loss, weight gain, vision loss, decreased hearing, hoarseness, syncope, peripheral edema, hemoptysis, abdominal pain, and severe  indigestion/heartburn.    Vital Signs:  Patient profile:   60 year old female Height:      62 inches Weight:      140 pounds BMI:     25.70 O2 Sat:      97 % on Room air Pulse rate:   69 / minute BP sitting:   132 / 86  (right arm) Cuff size:   regular  Vitals Entered By: Gweneth Dimitri RN (Jul 18, 2009 10:30 AM)  O2  Flow:  Room air CC: Acute Visit.  Pt c/o prod cough with clear mucus, HA and SOB when coughing.  Pt states these symptoms have been going on since January but cough has gotten worse x 2months.  Pt also c/o " pains in chest and ears" x 2-3 wks.   Comments Medications reviewed with patient Daytime contact number verified with patient. Gweneth Dimitri RN  Jul 18, 2009 10:30 AM    Physical Exam  Additional Exam:  General: A/Ox3; pleasant and cooperative, NAD, SKIN: no rash, lesions NODES: no lymphadenopathy HEENT: [place]/AT, EOM- WNL, Conjuctivae- clear, PERRLA, TM-WNL, Nose- clear, Throat- clear and wnl, Mallampati  II, not red and no pnd NECK: Supple w/ fair ROM, JVD- none, normal carotid impulses w/o bruits Thyroid- normal to palpation CHEST: hard cough, some wheeze HEART: RRR, no m/g/r heard ABDOMEN: Soft and nl;  NUU:VOZD, nl pulses, no edema  NEURO: Grossly intact to observation      Impression & Recommendations:  Problem # 1:  ASTHMA (ICD-493.90) This is asthma or asthmatic bronchitis. I can't tell if the underlying problem may be viral or allergy, in which case it will get better. She asks about Alvesco, which is working well for her sister with similar problem. We will give neb and depo for acute control with weekend coming, and add Alvescoas a steroid maintenance inhaler.  Problem # 2:  ALLERGIC RHINITIS (ICD-477.9)  Exacerbation of rhintis along with the asthma. She would like to try Zyrtec and we discussed antihistamines and decongestants. The following medications were removed from the medication list:    Fexofenadine Hcl 180 Mg Tabs (Fexofenadine hcl) ..... Once daily Her updated medication list for this problem includes:    Zyrtec Allergy 10 Mg Tbdp (Cetirizine hcl) .Marland Kitchen... 1 daily as needed antihistamine    Astepro 0.15 % Soln (Azelastine hcl) .Marland Kitchen... 1-2 puffs each nostril up to twice daily as needed  Medications Added to Medication List This Visit: 1)  Zyrtec Allergy 10 Mg  Tbdp (Cetirizine hcl) .Marland Kitchen.. 1 daily as needed antihistamine 2)  Glucosamine Chondroit-collagen Caps (Glucos-chondroit-collag-hyal) .... Once daily 3)  Fexofenadine Hcl 180 Mg Tabs (Fexofenadine hcl) .... Once daily 4)  Alvesco 80 Mcg/act Aers (Ciclesonide) .... 2 puffs and rinse mouth, twice daily 5)  Promethazine-codeine 6.25-10 Mg/20ml Syrp (Promethazine-codeine) .Marland Kitchen.. 1 teaspoon four times a day as needed cough  Other Orders: Est. Patient Level III (66440) Depo- Medrol 80mg  (J1040) Admin of Therapeutic Inj  intramuscular or subcutaneous (34742) Nebulizer Tx (59563)  Patient Instructions: 1)  Keep appointment in June unless needed sooner. Call as needed 2)  neb xop 1.25 3)  depo 80 4)  sample/script Alvesco 80: 2 puffs and rinse mouth, twice every day. 5)  Script for cough syrup Prescriptions: PROMETHAZINE-CODEINE 6.25-10 MG/5ML SYRP (PROMETHAZINE-CODEINE) 1 teaspoon four times a day as needed cough  #200 ml x 0   Entered by:   Gweneth Dimitri RN   Authorized by:   Waymon Budge MD   Signed  by:   Gweneth Dimitri RN on 07/18/2009   Method used:   Print then Give to Patient   RxID:   [id number] PROMETHAZINE-CODEINE 6.25-10 MG/5ML SYRP (PROMETHAZINE-CODEINE) 1 teaspoon four times a day as needed cough  #200 ml x 0   Entered and Authorized by:   Waymon Budge MD   Signed by:   Waymon Budge MD on 07/18/2009   Method used:   Print then Give to Patient   RxID:   [id number] ALVESCO 80 MCG/ACT AERS (CICLESONIDE) 2 puffs and rinse mouth, twice daily  #1 x prn   Entered and Authorized by:   Waymon Budge MD   Signed by:   Waymon Budge MD on 07/18/2009   Method used:   Print then Give to Patient   RxID:   [id number]  1st promethazine codeine rx did not print.  Gweneth Dimitri RN  Jul 18, 2009 11:16 AM   Immunization History:  Influenza Immunization History:    Influenza:  historical (01/13/2009)    Medication Administration  Injection # 1:    Medication:  Depo- Medrol 80mg     Diagnosis: ASTHMA (ICD-493.90)    Route: IM    Site: LUOQ gluteus    Exp Date: 01/2012    Lot #: [id number]    Mfr: [employer]    Patient tolerated injection without complications    Given by: Gweneth Dimitri RN (Jul 18, 2009 11:18 AM)  Orders Added: 1)  Est. Patient Level III [01027] 2)  Depo- Medrol 80mg  [J1040] 3)  Admin of Therapeutic Inj  intramuscular or subcutaneous [96372] 4)  Nebulizer Tx [25366]

## 2010-04-14 NOTE — Assessment & Plan Note (Signed)
Summary: rov 1 month ///kp   Primary Provider/Referring Provider:  Jeanmarie Hubert  CC:  1 month followup ptc/o cough with clear production and some sob.  History of Present Illness: June 19, 2009- Rhinitis, bronchitis, GERD,  Sarcoid/eye Cough is better- still some wheeze but not bad. She can tell that pollen bothers her if outside. Loratadine has helped her nose. Since finsihing cefdinir nothing seems infected.. Occasional frontal headache.  Jul 18, 2009- Rhinitis, bronchitis, GERD Over past 3 weeks has had gradually worsening cough, bitemporal headache, tussive anterior chest pain, clear mucus from nose and chest. Denies fever, sore throat, nodes, rash, purulence, blood or ankle edema. Has not really felt well in 2-3 months, but not able to pin down.  Aug 12, 2009- Rhinitis, bronchitis, GERD, Sarcoid/ eye Still cough all spring, clear mucus.Cough til retch, but denies GI upset. Tussive soreness substernal. some wheeze. Alvesco and Proair help some. Rhinitis with postnasal drip, frontal headache. No fever or glands.Denies heartburn or reflux, purulrnt or blood. Cough makes her depressed, confining.  September 09, 2009- Rhinitis, bronchitis, GERD, Sarcoid/ eye Eye/ sarcoid ok with no new problems- for f/u next yearwith her opthalm. Still feels cough. hoarsness, soreness in anterior chest. No good days. Alvesco used two times a day and tusionex helps ome. Astepro helps dry some. Proair helps some. menopausal. Reviewed recent labs again- all normal. Occasionally wakes at night cough/ choke. Sleep walking if takes med late. No sleeping pill. Not aware of heart burn. Denies irritant exposures. Moss outside house. Has moisture barrier under house- no longer mold seen.      Asthma History    Initial Asthma Severity Rating:    Age range: 12+ years    Symptoms: daily    Nighttime Awakenings: 0-2/month    Interferes w/ normal activity: no limitations    SABA use (not for EIB): >2 days/week but not  >1X/day    Asthma Severity Assessment: Moderate Persistent   Preventive Screening-Counseling & Management  Alcohol-Tobacco     Smoking Status: never  Current Medications (verified): 1)  Toprol Xl 50 Mg Xr24h-Tab (Metoprolol Succinate) .... Take 1 1/2 By Mouth Once Daily 2)  Aspirin 81 Mg Tbec (Aspirin) .... Take 1 By Mouth Once Daily 3)  Multivitamins  Tabs (Multiple Vitamin) .... Take 1 By Mouth Once Daily 4)  Fish Oil 1200 Mg Caps (Omega-3 Fatty Acids) .... Take 1 By Mouth Once Daily 5)  Zyrtec Allergy 10 Mg Tbdp (Cetirizine Hcl) .Marland Kitchen.. 1 Daily As Needed Antihistamine 6)  Proair Hfa 108 (90 Base) Mcg/act Aers (Albuterol Sulfate) .... 2 Puffs Four Times A Day As Needed 7)  Astepro 0.15 % Soln (Azelastine Hcl) .Marland Kitchen.. 1-2 Puffs Each Nostril Up To Twice Daily As Needed 8)  Glucosamine Chondroit-Collagen  Caps (Glucos-Chondroit-Collag-Hyal) .... Once Daily 9)  Alvesco 80 Mcg/act Aers (Ciclesonide) .... 2 Puffs and Rinse Mouth, Twice Daily 10)  Tussionex Pennkinetic Er 8-10 Mg/85ml Lqcr (Chlorpheniramine-Hydrocodone) .Marland Kitchen.. 1 Teaspoon Two Times A Day As Needed Cough  Allergies: 1)  ! * Cefdinir 2)  ! Sulfa  Past History:  Past Medical History: Last updated: 08/12/2009 Allergic Rhinitis Rhinosinusitis bronchitis Occular sarcoid  Past Surgical History: Last updated: 04/29/2009 None  Family History: Last updated: 04/29/2009 Father- unknown health status Mother- alive, asthma/ bronchitis, HTN, CVA Daughter- allergies  Social History: Last updated: 04/29/2009 Patient never smoked.  Retired Runner, broadcasting/film/video  Risk Factors: Smoking Status: never (09/09/2009)  Review of Systems      See HPI  The patient complains of shortness of breath with activity, non-productive cough, chest pain, and nasal congestion/difficulty breathing through nose.  The patient denies shortness of breath at rest, productive cough, coughing up blood, irregular heartbeats, acid heartburn, indigestion, loss of  appetite, weight change, abdominal pain, difficulty swallowing, sore throat, tooth/dental problems, headaches, and sneezing.    Vital Signs:  Patient profile:   60 year old female Height:      62 inches Weight:      142 pounds BMI:     26.07 O2 Sat:      98 % on Room air Temp:     97.6 degrees F oral Pulse rate:   75 / minute BP sitting:   110 / 70  Vitals Entered By: alida rogers cma  O2 Flow:  Room air CC: 1 month followup ptc/o cough with clear production, some sob   Physical Exam  Additional Exam:  General: A/Ox3; pleasant and cooperative, NAD, SKIN: no rash, lesions NODES: no lymphadenopathy HEENT: [place]/AT, EOM- WNL, Conjuctivae- clear, PERRLA, TM-WNL, Nose- clear, Throat- cobblestoned, Mallampati  II,  no visible drainage. Hoarse, no stridor NECK: Supple w/ fair ROM, JVD- none, normal carotid impulses w/o bruits Thyroid- normal to palpation CHEST:No cough or wheeze, dullness or rub. HEART: RRR, no m/g/r heard ABDOMEN: Soft and nl;  SAY:TKZS, nl pulses, no edema  NEURO: Grossly intact to observation      Impression & Recommendations:  Problem # 1:  ? of GERD (ICD-530.81)  Not demonstrated objectively, but her cobblestoning, normal labs and persistent symptoms suggest ongoing acid reflux disease, perhaps during sleep. We will try a two times a day acid blocker for a month to see what that does.  Her updated medication list for this problem includes:    Omeprazole 20 Mg Cpdr (Omeprazole) .Marland Kitchen... 1 two times a day before meals  Problem # 2:  ASTHMA (ICD-493.90) Bronchospasm is controlled with present meds  Problem # 3:  RHINITIS (ICD-472.0)  continues Zyrtec. Pending allergy skin test. She will be off antihjistamine for that as discussed. If aggressive anti GERD therapy relieves her symptoms, skin testing will be cancelled.  Medications Added to Medication List This Visit: 1)  Omeprazole 20 Mg Cpdr (Omeprazole) .Marland Kitchen.. 1 two times a day before meals  Other  Orders: Est. Patient Level IV (01093)  Patient Instructions: 1)  Keep appointment for allergy skin testing off antihistamaines as planned 2)  Try acid blocker- otc omeprazole 20 mg twice every day before meals

## 2010-04-14 NOTE — Progress Notes (Signed)
Summary: results   Phone Note Call from Patient Call back at Home Phone [phone]   Caller: Patient Call For: Altariq Goodall Reason for Call: Talk to Nurse Summary of Call: pt wants results of ct and labs Initial call taken by: Eugene Gavia,  August 14, 2009 4:16 PM  Follow-up for Phone Call        CBC and CT Sinus have been appended, but still waiting for allergy profile to be signed.  Please advise, thanks! Follow-up by: Vernie Murders,  August 14, 2009 4:19 PM  Additional Follow-up for Phone Call Additional follow up Details #1::        RETURNING CALL .Marland KitchenChantel Bowne  August 15, 2009 12:28 PM    Additional Follow-up for Phone Call Additional follow up Details #2::    OK to report labs and CT sinus as appended Follow-up by: Waymon Budge MD,  August 15, 2009 12:56 PM  Additional Follow-up for Phone Call Additional follow up Details #3:: Details for Additional Follow-up Action Taken: pt advised of results per appends. she was transferred to front o schedule skin test. Carron Curie CMA  August 15, 2009 1:44 PM

## 2010-04-14 NOTE — Assessment & Plan Note (Signed)
Summary: rov 1 month///kp   Primary Provider/Referring Provider:  Jeanmarie Hubert  CC:  Follow up visit-still cough"whistle" sounds and some wheezing at times. .  History of Present Illness: History of Present Illness: April 29, 2009- Rhinitis, bronchitis, GERD 47 yoF never smoker, last seen 2006. Comes now to re-establish. Had flu vax. For 31/2 months has had cough and postnasal drip that started with a cold. PA at the ENT office took her off fexofenadine and gave Nasonex which hurts when sprayed in her nose. Had sinus Xray "not my sinuses".Frontal headache, sorethroat, no fever. Variable cough, sometimes with wheeze or deep, with clear mucus. Cough is keeping her from going out. She has a long hx of recurrent sinus and cough. Her eye doctor told her she has sarcoid in her eyes.  Hx pansinusiis on CT 2005. Lifelong recurrent rhinosinusitis and bronchitis. Skin test positive, was on allergy vaccine around 1999.  May 22, 2009- Rhinits, bronchitis, GERD Cefdinir gave diarrhea for 2 weeks. She is better but still feels something in throat and notes some cough. Denies fever, nodes, sore throat, nodes, wheeze or discolored sputum. Throat sensation started with the rest of her bronchitis about 3 months ago. Nasonex "hurts". Neti pot helps some.  June 19, 2009- Rhinitis, bronchitis, GERD,  Sarcoid/eye Cough is better- still some wheeze but not bad. She can tell that pollen bothers her if outside. Loratadine has helped her nose. Since finsihing cefdinir nothing seems infected.. Occasional frontal headache.   Current Medications (verified): 1)  Toprol Xl 50 Mg Xr24h-Tab (Metoprolol Succinate) .... Take 1 1/2 By Mouth Once Daily 2)  Aspirin 81 Mg Tbec (Aspirin) .... Take 1 By Mouth Once Daily 3)  Multivitamins  Tabs (Multiple Vitamin) .... Take 1 By Mouth Once Daily 4)  Fish Oil 1200 Mg Caps (Omega-3 Fatty Acids) .... Take 1 By Mouth Once Daily 5)  Loratadine 10 Mg Tabs (Loratadine) .... Take 1 By  Mouth Once Daily  Allergies: 1)  ! * Cefdinir 2)  ! Sulfa  Past History:  Past Medical History: Last updated: 04/29/2009 Allergic Rhinitis Rhinosinusitis bronchitis  Past Surgical History: Last updated: 04/29/2009 None  Family History: Last updated: 04/29/2009 Father- unknown health status Mother- alive, asthma/ bronchitis, HTN, CVA Daughter- allergies  Social History: Last updated: 04/29/2009 Patient never smoked.  Retired Runner, broadcasting/film/video  Risk Factors: Smoking Status: never (04/29/2009)  Review of Systems      See HPI       The patient complains of prolonged cough and headaches.  The patient denies anorexia, fever, weight loss, weight gain, vision loss, decreased hearing, hoarseness, chest pain, syncope, dyspnea on exertion, peripheral edema, hemoptysis, abdominal pain, and severe indigestion/heartburn.    Vital Signs:  Patient profile:   60 year old female Height:      62 inches Weight:      140 pounds BMI:     25.70 O2 Sat:      100 % on Room air Pulse rate:   64 / minute BP sitting:   116 / 70  (left arm) Cuff size:   regular  Vitals Entered By: Reynaldo Minium CMA (June 19, 2009 9:52 AM)  O2 Flow:  Room air  Physical Exam  Additional Exam:  General: A/Ox3; pleasant and cooperative, NAD, SKIN: no rash, lesions NODES: no lymphadenopathy HEENT: [place]/AT, EOM- WNL, Conjuctivae- clear, PERRLA, TM-WNL, Nose- clear, Throat- clear and wnl, Mallampati  II, not red and no pnd NECK: Supple w/ fair ROM, JVD- none, normal carotid impulses  w/o bruits Thyroid- normal to palpation CHEST: Clear to P&A, no cough here, few faint crackles but no wheeze, unlabored HEART: RRR, no m/g/r heard ABDOMEN: Soft and nl;  WGN:FAOZ, nl pulses, no edema  NEURO: Grossly intact to observation      Impression & Recommendations:  Problem # 1:  ALLERGIC RHINITIS (ICD-477.9) Seasonal rhinitis. We will refill her Astepro which seems to be helping some. Her updated medication list for this  problem includes:    Loratadine 10 Mg Tabs (Loratadine) .Marland Kitchen... Take 1 by mouth once daily    Astepro 0.15 % Soln (Azelastine hcl) .Marland Kitchen... 1-2 puffs each nostril up to twice daily as needed  Problem # 2:  BRONCHITIS, ACUTE (ICD-466.0)  Asthmatic bronchitis. We have given Proair to try. Her updated medication list for this problem includes:    Proair Hfa 108 (90 Base) Mcg/act Aers (Albuterol sulfate) .Marland Kitchen... 2 puffs four times a day as needed  Medications Added to Medication List This Visit: 1)  Loratadine 10 Mg Tabs (Loratadine) .... Take 1 by mouth once daily 2)  Proair Hfa 108 (90 Base) Mcg/act Aers (Albuterol sulfate) .... 2 puffs four times a day as needed 3)  Astepro 0.15 % Soln (Azelastine hcl) .Marland Kitchen.. 1-2 puffs each nostril up to twice daily as needed  Other Orders: Est. Patient Level III (30865)  Patient Instructions: 1)  Please schedule a follow-up appointment in 2 months. 2)  Script refill for Astepro nasal spray 3)  Sample/ script for Proair rescue inhaler 4)  Ok to use antihistamine like loratadine as needed Prescriptions: ASTEPRO 0.15 % SOLN (AZELASTINE HCL) 1-2 puffs each nostril up to twice daily as needed  #1 x prn   Entered and Authorized by:   Waymon Budge MD   Signed by:   Waymon Budge MD on 06/19/2009   Method used:   Print then Give to Patient   RxID:   [id number] PROAIR HFA 108 (90 BASE) MCG/ACT AERS (ALBUTEROL SULFATE) 2 puffs four times a day as needed  #1 x prn   Entered and Authorized by:   Waymon Budge MD   Signed by:   Waymon Budge MD on 06/19/2009   Method used:   Print then Give to Patient   RxID:   [id number]

## 2010-04-14 NOTE — Assessment & Plan Note (Signed)
Summary: Acute NP office visit - asthma   Primary Provider/Referring Provider:  Jeanmarie Hubert  CC:  increased SOB, wheezing, prod cough with clear mucus, tightness in chest, and HA x42months - states she also notices that when she takes her antihistamine her "laryngitis gets worse".  History of Present Illness: Jul 18, 2009- Rhinitis, bronchitis, GERD Over past 3 weeks has had gradually worsening cough, bitemporal headache, tussive anterior chest pain, clear mucus from nose and chest. Denies fever, sore throat, nodes, rash, purulence, blood or ankle edema. Has not really felt well in 2-3 months, but not able to pin down.  Aug 12, 2009- Rhinitis, bronchitis, GERD, Sarcoid/ eye Still cough all spring, clear mucus.Cough til retch, but denies GI upset. Tussive soreness substernal. some wheeze. Alvesco and Proair help some. Rhinitis with postnasal drip, frontal headache. No fever or glands.Denies heartburn or reflux, purulrnt or blood. Cough makes her depressed, confining.  September 09, 2009- Rhinitis, bronchitis, GERD, Sarcoid/ eye Eye/ sarcoid ok with no new problems- for f/u next yearwith her opthalm. Still feels cough. hoarsness, soreness in anterior chest. No good days. Alvesco used two times a day and tusionex helps ome. Astepro helps dry some. Proair helps some. menopausal. Reviewed recent labs again- all normal. Occasionally wakes at night cough/ choke. Sleep walking if takes med late. No sleeping pill. Not aware of heart burn. Denies irritant exposures. Moss outside house. Has moisture barrier under house- no longer mold seen.  September 23, 2009- Rhinitis, bronchitis, GERD, Sarcopid/ eye Still feeling some throat tickle and postnasal drip at times, but couldn't appriciate any reflux. Tramadol has been helping her cough. Comes today for skin testing as planned. Skin test- Mild grass, tree and endodermals  10/09/09--Presents for an acute office visit. Complains of increased SOB, wheezing, prod cough  with clear mucus, tightness in chest, HA x11months - states she also notices that when she takes her antihistamine her "laryngitis gets worse".  She has been having recurrent cough, wheezing, and dyspnea over last several months. Symptoms wax/wane but never totally resolve. .Nothing has worked. Denies chest pain,   orthopnea, hemoptysis, fever, n/v/d, edema, headache,recent travel.   Medications Prior to Update: 1)  Toprol Xl 50 Mg Xr24h-Tab (Metoprolol Succinate) .... Take 1 1/2 By Mouth Once Daily 2)  Aspirin 81 Mg Tbec (Aspirin) .... Take 1 By Mouth Once Daily 3)  Multivitamins  Tabs (Multiple Vitamin) .... Take 1 By Mouth Once Daily 4)  Fish Oil 1200 Mg Caps (Omega-3 Fatty Acids) .... Take 1 By Mouth Once Daily 5)  Zyrtec Allergy 10 Mg Tbdp (Cetirizine Hcl) .Marland Kitchen.. 1 Daily As Needed Antihistamine 6)  Proair Hfa 108 (90 Base) Mcg/act Aers (Albuterol Sulfate) .... 2 Puffs Four Times A Day As Needed 7)  Astepro 0.15 % Soln (Azelastine Hcl) .Marland Kitchen.. 1-2 Puffs Each Nostril Up To Twice Daily As Needed 8)  Glucosamine Chondroit-Collagen  Caps (Glucos-Chondroit-Collag-Hyal) .... Once Daily 9)  Tussionex Pennkinetic Er 8-10 Mg/13ml Lqcr (Chlorpheniramine-Hydrocodone) .Marland Kitchen.. 1 Teaspoon Two Times A Day As Needed Cough 10)  Tramadol Hcl 50 Mg Tabs (Tramadol Hcl) .Marland Kitchen.. 1 By Mouth Two Times A Day As Needed For Cough  Current Medications (verified): 1)  Toprol Xl 50 Mg Xr24h-Tab (Metoprolol Succinate) .... Take 1 1/2 By Mouth Once Daily 2)  Aspirin 81 Mg Tbec (Aspirin) .... Take 1 By Mouth Once Daily 3)  Multivitamins  Tabs (Multiple Vitamin) .... Take 1 By Mouth Once Daily 4)  Fish Oil 1200 Mg Caps (Omega-3 Fatty Acids) .... Take 1  By Mouth Once Daily 5)  Fexofenadine Hcl 180 Mg Tabs (Fexofenadine Hcl) .... Take 1 Tablet By Mouth Once A Day 6)  Proair Hfa 108 (90 Base) Mcg/act Aers (Albuterol Sulfate) .... 2 Puffs Four Times A Day As Needed 7)  Astepro 0.15 % Soln (Azelastine Hcl) .Marland Kitchen.. 1-2 Puffs Each Nostril Up To  Twice Daily As Needed 8)  Glucosamine Chondroit-Collagen  Caps (Glucos-Chondroit-Collag-Hyal) .... Once Daily 9)  Tussionex Pennkinetic Er 8-10 Mg/59ml Lqcr (Chlorpheniramine-Hydrocodone) .Marland Kitchen.. 1 Teaspoon Two Times A Day As Needed Cough 10)  Tramadol Hcl 50 Mg Tabs (Tramadol Hcl) .Marland Kitchen.. 1 By Mouth Two Times A Day As Needed For Cough  Allergies (verified): 1)  ! * Cefdinir 2)  ! Sulfa  Past History:  Past Medical History: Last updated: 08/12/2009 Allergic Rhinitis Rhinosinusitis bronchitis Occular sarcoid  Past Surgical History: Last updated: 04/29/2009 None  Family History: Last updated: 04/29/2009 Father- unknown health status Mother- alive, asthma/ bronchitis, HTN, CVA Daughter- allergies  Social History: Last updated: 04/29/2009 Patient never smoked.  Retired Runner, broadcasting/film/video  Risk Factors: Smoking Status: never (09/23/2009)  Review of Systems      See HPI  Vital Signs:  Patient profile:   60 year old female Height:      62 inches Weight:      142.25 pounds BMI:     26.11 O2 Sat:      96 % on Room air Temp:     97.2 degrees F oral Pulse rate:   70 / minute BP sitting:   142 / 90  (right arm) Cuff size:   regular  Vitals Entered By: Boone Master CNA/MA (October 09, 2009 2:17 PM)  O2 Flow:  Room air CC: increased SOB, wheezing, prod cough with clear mucus, tightness in chest, HA x53months - states she also notices that when she takes her antihistamine her "laryngitis gets worse" Is Patient Diabetic? No Comments Medications reviewed with patient Daytime contact number verified with patient. Boone Master CNA/MA  October 09, 2009 2:18 PM    Physical Exam  Additional Exam:  General: A/Ox3; pleasant and cooperative, NAD, SKIN: no rash, lesions NODES: no lymphadenopathy HEENT: [place]/AT, EOM- WNL, Conjuctivae- clear, PERRLA, TM-WNL, Nose- clear, Throat- minimal red, Mallampati  III,  no visible drainage. NECK: Supple w/ fair ROM, JVD- none, normal carotid impulses w/o bruits  Thyroid- normal to palpation CHEST:Coarse BS w/ no wheezing  HEART: RRR, no m/g/r heard ABDOMEN: Soft and nl;  OZH:YQMV, nl pulses, no edema  NEURO: Grossly intact to observation      Impression & Recommendations:  Problem # 1:  ASTHMA (ICD-493.90)  Recurrent flare w/ cough  REC:  Add Delsym 2 tsp two times a day  May use Tramadol 1-2 every 4 hr as needed for breakthrough cough--may make you sleepy.  Hold Fish oil.  Restart Omeprazole 20mg  once daily before meal .  Prednisone taper over next week.  Use sugarless candy, ice chips and water to help avoid coughing and throat clearing.  GOAL IS NO COUGHING. Voice rest helps.  I will call with xray results.  .Please contact office for sooner follow up if symptoms do not improve or worsen  follow up 3 weeks   Orders: T-2 View CXR ([id number]) Est. Patient Level IV (78469)  Medications Added to Medication List This Visit: 1)  Fexofenadine Hcl 180 Mg Tabs (Fexofenadine hcl) .... Take 1 tablet by mouth once a day 2)  Tramadol Hcl 50 Mg Tabs (Tramadol hcl) .Marland Kitchen.. 1-2 every 4 hr as needed  3)  Prednisone 10 Mg Tabs (Prednisone) .... 4 tabs for 3 days, then 3 tabs for 3 days, 2 tabs for 3 days, then 1 tab for 3 days, then stop  Complete Medication List: 1)  Toprol Xl 50 Mg Xr24h-tab (Metoprolol succinate) .... Take 1 1/2 by mouth once daily 2)  Aspirin 81 Mg Tbec (Aspirin) .... Take 1 by mouth once daily 3)  Multivitamins Tabs (Multiple vitamin) .... Take 1 by mouth once daily 4)  Fish Oil 1200 Mg Caps (Omega-3 fatty acids) .... Take 1 by mouth once daily 5)  Fexofenadine Hcl 180 Mg Tabs (Fexofenadine hcl) .... Take 1 tablet by mouth once a day 6)  Proair Hfa 108 (90 Base) Mcg/act Aers (Albuterol sulfate) .... 2 puffs four times a day as needed 7)  Astepro 0.15 % Soln (Azelastine hcl) .Marland Kitchen.. 1-2 puffs each nostril up to twice daily as needed 8)  Glucosamine Chondroit-collagen Caps (Glucos-chondroit-collag-hyal) .... Once daily 9)  Tussionex  Pennkinetic Er 8-10 Mg/82ml Lqcr (Chlorpheniramine-hydrocodone) .Marland Kitchen.. 1 teaspoon two times a day as needed cough 10)  Tramadol Hcl 50 Mg Tabs (Tramadol hcl) .Marland Kitchen.. 1-2 every 4 hr as needed 11)  Prednisone 10 Mg Tabs (Prednisone) .... 4 tabs for 3 days, then 3 tabs for 3 days, 2 tabs for 3 days, then 1 tab for 3 days, then stop  Patient Instructions: 1)  Add Delsym 2 tsp two times a day  2)  May use Tramadol 1-2 every 4 hr as needed for breakthrough cough--may make you sleepy.  3)  Hold Fish oil.  4)  Restart Omeprazole 20mg  once daily before meal .  5)  Prednisone taper over next week.  6)  Use sugarless candy, ice chips and water to help avoid coughing and throat clearing.  7)  GOAL IS NO COUGHING. Voice rest helps.  8)  I will call with xray results.  9)  .Please contact office for sooner follow up if symptoms do not improve or worsen  10)  follow up 3 weeks  Prescriptions: TRAMADOL HCL 50 MG TABS (TRAMADOL HCL) 1-2 every 4 hr as needed  #40 x 0   Entered and Authorized by:   Rubye Oaks NP   Signed by:   Rubye Oaks NP on 10/09/2009   Method used:   Electronically to        [facility] (retail)       [address]       [place], [place]  [address]       Ph: [phone]       Fax: [phone]   RxID:   [id number] PREDNISONE 10 MG TABS (PREDNISONE) 4 tabs for 3 days, then 3 tabs for 3 days, 2 tabs for 3 days, then 1 tab for 3 days, then stop  #30 x 0   Entered and Authorized by:   Rubye Oaks NP   Signed by:   Rubye Oaks NP on 10/09/2009   Method used:   [facility]        [facility] ([facility]ail[facility]       [address]       [place], [place]  [address]       Ph: [phone]       Fax: [phone]   RxID:   [id number]

## 2010-04-14 NOTE — Assessment & Plan Note (Signed)
Summary: NP follow up - asthma   Primary Provider/Referring Provider:  Jeanmarie Hubert  CC:  3 week follow up asthma and states symptoms have improved but still having some prod cough with clear mucus and PND.  History of Present Illness: Jul 18, 2009- Rhinitis, bronchitis, GERD Over past 3 weeks has had gradually worsening cough, bitemporal headache, tussive anterior chest pain, clear mucus from nose and chest. Denies fever, sore throat, nodes, rash, purulence, blood or ankle edema. Has not really felt well in 2-3 months, but not able to pin down.  Aug 12, 2009- Rhinitis, bronchitis, GERD, Sarcoid/ eye Still cough all spring, clear mucus.Cough til retch, but denies GI upset. Tussive soreness substernal. some wheeze. Alvesco and Proair help some. Rhinitis with postnasal drip, frontal headache. No fever or glands.Denies heartburn or reflux, purulrnt or blood. Cough makes her depressed, confining.  September 09, 2009- Rhinitis, bronchitis, GERD, Sarcoid/ eye Eye/ sarcoid ok with no new problems- for f/u next yearwith her opthalm. Still feels cough. hoarsness, soreness in anterior chest. No good days. Alvesco used two times a day and tusionex helps ome. Astepro helps dry some. Proair helps some. menopausal. Reviewed recent labs again- all normal. Occasionally wakes at night cough/ choke. Sleep walking if takes med late. No sleeping pill. Not aware of heart burn. Denies irritant exposures. Moss outside house. Has moisture barrier under house- no longer mold seen.  September 23, 2009- Rhinitis, bronchitis, GERD, Sarcopid/ eye Still feeling some throat tickle and postnasal drip at times, but couldn't appriciate any reflux. Tramadol has been helping her cough. Comes today for skin testing as planned. Skin test- Mild grass, tree and endodermals  10/09/09--Presents for an acute office visit. Complains of increased SOB, wheezing, prod cough with clear mucus, tightness in chest, HA x70months - states she also  notices that when she takes her antihistamine her "laryngitis gets worse".  She has been having recurrent cough, wheezing, and dyspnea over last several months. Symptoms wax/wane but never totally resolve. .Nothing has worked. Denies chest pain,   orthopnea, hemoptysis, fever, n/v/d, edema, headache,recent travel.   October 30, 2009--Presents for a 3 week follow up asthma, states symptoms have improved but still having some prod cough with clear mucus and PND but only occasionally. She feels much better w/ decreased cough. She was tx last visit for recurrent bronchitis and cough with steroids and cough suppression regimen w/ delsym/tramadol. Hoarseness is better but not totally resolved. Denies chest pain, dyspnea, orthopnea, hemoptysis, fever, n/v/d, edema, headache.   Medications Prior to Update: 1)  Toprol Xl 50 Mg Xr24h-Tab (Metoprolol Succinate) .... Take 1 1/2 By Mouth Once Daily 2)  Aspirin 81 Mg Tbec (Aspirin) .... Take 1 By Mouth Once Daily 3)  Multivitamins  Tabs (Multiple Vitamin) .... Take 1 By Mouth Once Daily 4)  Fish Oil 1200 Mg Caps (Omega-3 Fatty Acids) .... Take 1 By Mouth Once Daily 5)  Fexofenadine Hcl 180 Mg Tabs (Fexofenadine Hcl) .... Take 1 Tablet By Mouth Once A Day 6)  Proair Hfa 108 (90 Base) Mcg/act Aers (Albuterol Sulfate) .... 2 Puffs Four Times A Day As Needed 7)  Astepro 0.15 % Soln (Azelastine Hcl) .Marland Kitchen.. 1-2 Puffs Each Nostril Up To Twice Daily As Needed 8)  Glucosamine Chondroit-Collagen  Caps (Glucos-Chondroit-Collag-Hyal) .... Once Daily 9)  Tussionex Pennkinetic Er 8-10 Mg/71ml Lqcr (Chlorpheniramine-Hydrocodone) .Marland Kitchen.. 1 Teaspoon Two Times A Day As Needed Cough 10)  Tramadol Hcl 50 Mg Tabs (Tramadol Hcl) .Marland Kitchen.. 1-2 Every 4 Hr As Needed 11)  Prednisone 10 Mg Tabs (Prednisone) .... 4 Tabs For 3 Days, Then 3 Tabs For 3 Days, 2 Tabs For 3 Days, Then 1 Tab For 3 Days, Then Stop  Current Medications (verified): 1)  Toprol Xl 50 Mg Xr24h-Tab (Metoprolol Succinate) .... Take  1 1/2 By Mouth Once Daily 2)  Aspirin 81 Mg Tbec (Aspirin) .... Take 1 By Mouth Once Daily 3)  Multivitamins  Tabs (Multiple Vitamin) .... Take 1 By Mouth Once Daily 4)  Fish Oil 1200 Mg Caps (Omega-3 Fatty Acids) .... ***hold*** Take 1 By Mouth Once Daily 5)  Fexofenadine Hcl 180 Mg Tabs (Fexofenadine Hcl) .... Take 1 Tablet By Mouth Once A Day 6)  Proair Hfa 108 (90 Base) Mcg/act Aers (Albuterol Sulfate) .... 2 Puffs Four Times A Day As Needed 7)  Astepro 0.15 % Soln (Azelastine Hcl) .Marland Kitchen.. 1-2 Puffs Each Nostril Up To Twice Daily As Needed 8)  Glucosamine Chondroit-Collagen  Caps (Glucos-Chondroit-Collag-Hyal) .... Once Daily 9)  Tussionex Pennkinetic Er 8-10 Mg/27ml Lqcr (Chlorpheniramine-Hydrocodone) .Marland Kitchen.. 1 Teaspoon Two Times A Day As Needed Cough 10)  Tramadol Hcl 50 Mg Tabs (Tramadol Hcl) .Marland Kitchen.. 1-2 Every 4 Hr As Needed 11)  Omeprazole 20 Mg Cpdr (Omeprazole) .... Take 1 Capsule By Mouth Once A Day Before Breakfast 12)  Vitamin E 400 Unit Caps (Vitamin E) .... Take 1 Capsule By Mouth Once A Day 13)  Delsym 30 Mg/9ml Lqcr (Dextromethorphan Polistirex) .... Per Bottle As Needed  Allergies (verified): 1)  ! * Cefdinir 2)  ! Sulfa  Past History:  Past Medical History: Last updated: 08/12/2009 Allergic Rhinitis Rhinosinusitis bronchitis Occular sarcoid  Family History: Last updated: 04/29/2009 Father- unknown health status Mother- alive, asthma/ bronchitis, HTN, CVA Daughter- allergies  Social History: Last updated: 04/29/2009 Patient never smoked.  Retired Runner, broadcasting/film/video  Risk Factors: Smoking Status: never (09/23/2009)  Review of Systems      See HPI  Vital Signs:  Patient profile:   60 year old female Height:      62 inches Weight:      142 pounds BMI:     26.07 O2 Sat:      96 % on Room air Temp:     97.9 degrees F oral Pulse rate:   69 / minute BP sitting:   118 / 78  (left arm) Cuff size:   regular  Vitals Entered By: Boone Master CNA/MA (October 30, 2009  10:07 AM)  O2 Flow:  Room air CC: 3 week follow up asthma, states symptoms have improved but still having some prod cough with clear mucus and PND Is Patient Diabetic? No Comments Medications reviewed with patient Daytime contact number verified with patient. Boone Master CNA/MA  October 30, 2009 10:07 AM    Physical Exam  Additional Exam:  General: A/Ox3; pleasant and cooperative, NAD, SKIN: no rash, lesions NODES: no lymphadenopathy HEENT: [place]/AT, EOM- WNL, Conjuctivae- clear, PERRLA, TM-WNL, Nose- clear, Throat- Mallampati  III,  no visible drainage. NECK: Supple w/ fair ROM, JVD- none, normal carotid impulses w/o bruits Thyroid- normal to palpation CHEST CTA w/ no wheezing HEART: RRR, no m/g/r heard ABDOMEN: Soft and nl;  ZOX:WRUE, nl pulses, no edema  NEURO: Grossly intact to observation      Impression & Recommendations:  Problem # 1:  COUGH (ICD-786.2) Upper airway cough syndrome, improved rEC : May restart Fish Oil 11/13/09-place in freezer.  Restart Astepro 2 puffs at bedtime  Saline nasal rinses as needed for nasal congestion/drainage.  Use ProAir HFA for rescue  use/emergency inhaler for wheezing/shortness of breath Use  Delsym 2 tsp two times a day as needed cough May use Tramadol 1-2 every 4 hr as needed for breakthrough cough--may make you sleepy.  Continue on  Omeprazole 20mg  once daily before meal .   Use sugarless candy, ice chips and water to help avoid coughing and throat clearing.  GOAL IS NO COUGHING. Voice rest helps.  .Please contact office for sooner follow up if symptoms do not improve or worsen  follow up Dr. Maple Hudson as scheduled.   Orders: Est. Patient Level II (84696)  Medications Added to Medication List This Visit: 1)  Fish Oil 1200 Mg Caps (Omega-3 fatty acids) .... ***hold*** take 1 by mouth once daily 2)  Omeprazole 20 Mg Cpdr (Omeprazole) .... Take 1 capsule by mouth once a day before breakfast 3)  Vitamin E 400 Unit Caps  (Vitamin e) .... Take 1 capsule by mouth once a day 4)  Delsym 30 Mg/30ml Lqcr (Dextromethorphan polistirex) .... Per bottle as needed  Complete Medication List: 1)  Toprol Xl 50 Mg Xr24h-tab (Metoprolol succinate) .... Take 1 1/2 by mouth once daily 2)  Aspirin 81 Mg Tbec (Aspirin) .... Take 1 by mouth once daily 3)  Multivitamins Tabs (Multiple vitamin) .... Take 1 by mouth once daily 4)  Fish Oil 1200 Mg Caps (Omega-3 fatty acids) .... ***hold*** take 1 by mouth once daily 5)  Fexofenadine Hcl 180 Mg Tabs (Fexofenadine hcl) .... Take 1 tablet by mouth once a day 6)  Proair Hfa 108 (90 Base) Mcg/act Aers (Albuterol sulfate) .... 2 puffs four times a day as needed 7)  Astepro 0.15 % Soln (Azelastine hcl) .Marland Kitchen.. 1-2 puffs each nostril up to twice daily as needed 8)  Glucosamine Chondroit-collagen Caps (Glucos-chondroit-collag-hyal) .... Once daily 9)  Tussionex Pennkinetic Er 8-10 Mg/66ml Lqcr (Chlorpheniramine-hydrocodone) .Marland Kitchen.. 1 teaspoon two times a day as needed cough 10)  Tramadol Hcl 50 Mg Tabs (Tramadol hcl) .Marland Kitchen.. 1-2 every 4 hr as needed 11)  Omeprazole 20 Mg Cpdr (Omeprazole) .... Take 1 capsule by mouth once a day before breakfast 12)  Vitamin E 400 Unit Caps (Vitamin e) .... Take 1 capsule by mouth once a day 13)  Delsym 30 Mg/86ml Lqcr (Dextromethorphan polistirex) .... Per bottle as needed  Patient Instructions: 1)  May restart Fish Oil 11/13/09-place in freezer.  2)  Restart Astepro 2 puffs at bedtime  3)  Saline nasal rinses as needed for nasal congestion/drainage.  4)  Use ProAir HFA for rescue use/emergency inhaler for wheezing/shortness of breath 5)  Use  Delsym 2 tsp two times a day as needed cough 6)  May use Tramadol 1-2 every 4 hr as needed for breakthrough cough--may make you sleepy.  7)  Continue on  Omeprazole 20mg  once daily before meal .  8)   Use sugarless candy, ice chips and water to help avoid coughing and throat clearing.  9)  GOAL IS NO COUGHING. Voice  rest helps.  10)  .Please contact office for sooner follow up if symptoms do not improve or worsen  11)  follow up Dr. Maple Hudson as scheduled.

## 2010-04-14 NOTE — Assessment & Plan Note (Signed)
Summary: allergy testing/kcw   Vital Signs:  Patient profile:   60 year old female Height:      62 inches Weight:      143 pounds O2 Sat:      98 % on Room air Pulse rate:   63 / minute BP sitting:   140 / 88  (right arm)  Vitals Entered By: Renold Genta RCP, LPN (September 23, 2009 1:56 PM)  O2 Flow:  Room air CC: Allergy Testing Comments Medications reviewed with patient Renold Genta RCP, LPN  September 23, 2009 1:57 PM    Primary Provider/Referring Provider:  Jeanmarie Hubert  CC:  Allergy Testing.  History of Present Illness: Jul 18, 2009- Rhinitis, bronchitis, GERD Over past 3 weeks has had gradually worsening cough, bitemporal headache, tussive anterior chest pain, clear mucus from nose and chest. Denies fever, sore throat, nodes, rash, purulence, blood or ankle edema. Has not really felt well in 2-3 months, but not able to pin down.  Aug 12, 2009- Rhinitis, bronchitis, GERD, Sarcoid/ eye Still cough all spring, clear mucus.Cough til retch, but denies GI upset. Tussive soreness substernal. some wheeze. Alvesco and Proair help some. Rhinitis with postnasal drip, frontal headache. No fever or glands.Denies heartburn or reflux, purulrnt or blood. Cough makes her depressed, confining.  September 09, 2009- Rhinitis, bronchitis, GERD, Sarcoid/ eye Eye/ sarcoid ok with no new problems- for f/u next yearwith her opthalm. Still feels cough. hoarsness, soreness in anterior chest. No good days. Alvesco used two times a day and tusionex helps ome. Astepro helps dry some. Proair helps some. menopausal. Reviewed recent labs again- all normal. Occasionally wakes at night cough/ choke. Sleep walking if takes med late. No sleeping pill. Not aware of heart burn. Denies irritant exposures. Moss outside house. Has moisture barrier under house- no longer mold seen.  September 23, 2009- Rhinitis, bronchitis, GERD, Sarcopid/ eye Still feeling some throat tickle and postnasal drip at times, but couldn't appriciate  any reflux. Tramadol has been helping her cough. Comes today for skin testing as planned. Skin test- Mild grass, tree and endodermals   Asthma History    Asthma Control Assessment:    Age range: 12+ years    Symptoms: 0-2 days/week    Nighttime Awakenings: 0-2/month    Interferes w/ normal activity: no limitations    SABA use (not for EIB): 0-2 days/week    Asthma Control Assessment: Well Controlled   Preventive Screening-Counseling & Management  Alcohol-Tobacco     Smoking Status: never  Current Medications (verified): 1)  Toprol Xl 50 Mg Xr24h-Tab (Metoprolol Succinate) .... Take 1 1/2 By Mouth Once Daily 2)  Aspirin 81 Mg Tbec (Aspirin) .... Take 1 By Mouth Once Daily 3)  Multivitamins  Tabs (Multiple Vitamin) .... Take 1 By Mouth Once Daily 4)  Fish Oil 1200 Mg Caps (Omega-3 Fatty Acids) .... Take 1 By Mouth Once Daily 5)  Zyrtec Allergy 10 Mg Tbdp (Cetirizine Hcl) .Marland Kitchen.. 1 Daily As Needed Antihistamine 6)  Proair Hfa 108 (90 Base) Mcg/act Aers (Albuterol Sulfate) .... 2 Puffs Four Times A Day As Needed 7)  Astepro 0.15 % Soln (Azelastine Hcl) .Marland Kitchen.. 1-2 Puffs Each Nostril Up To Twice Daily As Needed 8)  Glucosamine Chondroit-Collagen  Caps (Glucos-Chondroit-Collag-Hyal) .... Once Daily 9)  Alvesco 80 Mcg/act Aers (Ciclesonide) .... 2 Puffs and Rinse Mouth, Twice Daily 10)  Tussionex Pennkinetic Er 8-10 Mg/43ml Lqcr (Chlorpheniramine-Hydrocodone) .Marland Kitchen.. 1 Teaspoon Two Times A Day As Needed Cough 11)  Omeprazole 20 Mg Cpdr (  Omeprazole) .Marland Kitchen.. 1 Two Times A Day Before Meals 12)  Tramadol Hcl 50 Mg Tabs (Tramadol Hcl) .Marland Kitchen.. 1 By Mouth Two Times A Day As Needed For Cough  Allergies (verified): 1)  ! * Cefdinir 2)  ! Sulfa  Past History:  Past Medical History: Last updated: 08/12/2009 Allergic Rhinitis Rhinosinusitis bronchitis Occular sarcoid  Past Surgical History: Last updated: 04/29/2009 None  Family History: Last updated: 04/29/2009 Father- unknown health  status Mother- alive, asthma/ bronchitis, HTN, CVA Daughter- allergies  Social History: Last updated: 04/29/2009 Patient never smoked.  Retired Runner, broadcasting/film/video  Risk Factors: Smoking Status: never (09/23/2009)  Review of Systems      See HPI       The patient complains of non-productive cough.  The patient denies shortness of breath with activity, shortness of breath at rest, productive cough, coughing up blood, chest pain, irregular heartbeats, acid heartburn, indigestion, loss of appetite, weight change, abdominal pain, difficulty swallowing, sore throat, tooth/dental problems, headaches, nasal congestion/difficulty breathing through nose, and sneezing.    Physical Exam  Additional Exam:  General: A/Ox3; pleasant and cooperative, NAD, SKIN: no rash, lesions NODES: no lymphadenopathy HEENT: [place]/AT, EOM- WNL, Conjuctivae- clear, PERRLA, TM-WNL, Nose- clear, Throat- minimal red, Mallampati  III,  no visible drainage. NECK: Supple w/ fair ROM, JVD- none, normal carotid impulses w/o bruits Thyroid- normal to palpation CHEST:No cough or wheeze, dullness or rub. HEART: RRR, no m/g/r heard ABDOMEN: Soft and nl;  ZOX:WRUE, nl pulses, no edema  NEURO: Grossly intact to observation      Impression & Recommendations:  Problem # 1:  ASTHMA (ICD-493.90) Nonspecific cough is thought most likely now to be multifactorial, with a cough-equivalent asthma component. She is now controlling much better with some use of tramadol. It isn't clear that either Alvesco or acid blocker helped, so those are being dropped off. She will gradually try to taper off tramadol and see how she does.  Problem # 2:  ALLERGIC RHINITIS (ICD-477.9)  Skin tests are positive, but not strong. If other measures don't help, we can come back to this, but for now she will stay with conservative measures. Her updated medication list for this problem includes:    Zyrtec Allergy 10 Mg Tbdp (Cetirizine hcl) .Marland Kitchen... 1 daily as needed  antihistamine    Astepro 0.15 % Soln (Azelastine hcl) .Marland Kitchen... 1-2 puffs each nostril up to twice daily as needed  Other Orders: Est. Patient Level II (45409) Allergy Puncture Test (81191) Allergy I.D Test (47829)  Patient Instructions: 1)  Please schedule a follow-up appointment in 3 months. 2)  OK to change antihistamine back to Allegra/ fexofenadine 3)  Stop Alvesco cortisone spray 4)  Ok to stop the acid blocker if you don't feel it. 5)  OK to use the other inhalers as you need.

## 2010-04-14 NOTE — Miscellaneous (Signed)
Summary: Skin Test/[facility]  Skin Test/[facility]   Imported By: Sherian Rein 09/26/2009 15:13:53  _____________________________________________________________________  External Attachment:    Type:   Image     Comment:   External Document

## 2010-04-14 NOTE — Progress Notes (Signed)
Summary: med question  Phone Note Call from Patient Call back at Home Phone [phone]   Caller: Patient Call For: young Summary of Call: pt has ALT next week. wants to know what she can take for her cough in the meantime (because of med restrictions). call home # first then cell [phone] Initial call taken by: Tivis Ringer, CNA,  September 18, 2009 9:53 AM  Follow-up for Phone Call        Pt c/o productive cough with clear mucus. Pt states the cough syrup Tussionex and Prometh/Codiene usually stops her cough 6 to 7 hours but due to testing will need an alternative. Please advise. Zackery Barefoot CMA  September 18, 2009 10:18 AM   Allergies:  1)  ! * Cefdinir 2)  ! Sulfa  Additional Follow-up for Phone Call Additional follow up Details #1::        Per CDY-give Tramadol 50mg  #20 take 1 by mouth two times a day as needed cough no refills.Reynaldo Minium CMA  September 18, 2009 10:41 AM   Patient is aware of RX and wants this to go to [facility] on [address]. Additional Follow-up by: Michel Bickers CMA,  September 18, 2009 11:05 AM    New/Updated Medications: TRAMADOL HCL 50 MG TABS (TRAMADOL HCL) 1 by mouth two times a day as needed for cough Prescriptions: TRAMADOL HCL 50 MG TABS (TRAMADOL HCL) 1 by mouth two times a day as needed for cough  #20 x 0   Entered by:   Michel Bickers CMA   Authorized by:   Waymon Budge MD   Signed by:   Michel Bickers CMA on 09/18/2009   Method used:   Electronically to        [facility] (retail)       [address]       [place], [place]  [address]       Ph: [phone]       Fax: [phone]   RxID:   [id number]

## 2010-04-14 NOTE — Progress Notes (Signed)
Summary: side effects/ meds  Phone Note Call from Patient Call back at Home Phone [phone]   Caller: Patient Call For: YOUNG Summary of Call: pt was given sample of daliresp monday at [facility]. she complains of diarrhea (started after taking this and has continued, although not severe). has a slight headache that began yesterday as well. she says this med has, however helped improve her coughing and she is breathing better. call pt at home # above or cell [phone] Initial call taken by: Tivis Ringer, CNA,  December 25, 2009 11:01 AM  Follow-up for Phone Call        called and spoke with pt.  pt was given samples of Daliresp at [facility] with CY on 12/22/2009.  Pt states while the med has helped improve her cough greatly ( she is no longer coughing to the point of gagging/vomiting and hasn't had to use cough syrup) she does c/o questionable side effects.  Pt states she has had an upset stomach and diarrhea since Tuesday and a "slight headache on the sides of her head" Pt wonders if these are s/e of the med.  Will forward message to CY to address.  Aundra Millet Reynolds LPN  December 25, 2009 11:35 AM   Additional Follow-up for Phone Call Additional follow up Details #1::        I don't believe these are side effects of Daliresp, which sould be similar to Advair or Symbicort. More likely this is an early viral syndrome since we are seeing a lot of that in town now. Encourage fluids, et. per DR. Young.    Additional Follow-up for Phone Call Additional follow up Details #2::    pt advised. Carron Curie CMA  December 25, 2009 12:01 PM

## 2010-04-14 NOTE — Assessment & Plan Note (Signed)
Summary: rov 4 months///kp   Primary Provider/Referring Provider:  Jeanmarie Hubert  CC:  4 month follow up visit-asthma; cough and bronchitis.Marland Kitchen  History of Present Illness: September 23, 2009- Rhinitis, bronchitis, GERD, Sarcoid/ eye Still feeling some throat tickle and postnasal drip at times, but couldn't appriciate any reflux. Tramadol has been helping her cough. Comes today for skin testing as planned. Skin test- Mild grass, tree and endodermals  10/09/09--Presents for an acute office visit. Complains of increased SOB, wheezing, prod cough with clear mucus, tightness in chest, HA x6 months - states she also notices that when she takes her antihistamine her "laryngitis gets worse".  She has been having recurrent cough, wheezing, and dyspnea over last several months. Symptoms wax/wane but never totally resolve. .Nothing has worked. Denies chest pain,   orthopnea, hemoptysis, fever, n/v/d, edema, headache,recent travel.   October 30, 2009--Presents for a 3 week follow up asthma, states symptoms have improved but still having some prod cough with clear mucus and PND but only occasionally. She feels much better w/ decreased cough. She was tx last visit for recurrent bronchitis and cough with steroids and cough suppression regimen w/ delsym/tramadol. Hoarseness is better but not totally resolved. Denies chest pain, dyspnea, orthopnea, hemoptysis, fever, n/v/d, edema, headache.   December 22, 2009- Rhinitis, bronchitis, GERD, Sarcopid/ eye cc:4 month follow up visit-asthma; cough, bronchitis. Saw NP last 2 times, treated for upper airway cough syndrome with voice rest, cough suppresion. She was doing better then got sick with sore throat on trip back. Had fever and worse cough. She is getting her voice back slowly after 3 weeks, but is coughing till she retches, bringing up mucus. Out of tramadol. Has been using Alvesco two times a day, using Astepro and proair as needed. She has had more cough and respiratory  complaints since last winter  Asthma History    Asthma Control Assessment:    Age range: 12+ years    Symptoms: 0-2 days/week    Nighttime Awakenings: 0-2/month    Interferes w/ normal activity: no limitations    SABA use (not for EIB): 0-2 days/week    Asthma Control Assessment: Well Controlled   Preventive Screening-Counseling & Management  Alcohol-Tobacco     Smoking Status: never  Current Medications (verified): 1)  Toprol Xl 50 Mg Xr24h-Tab (Metoprolol Succinate) .... Take 1 1/2 By Mouth Once Daily 2)  Aspirin 81 Mg Tbec (Aspirin) .... Take 1 By Mouth Once Daily 3)  Multivitamins  Tabs (Multiple Vitamin) .... Take 1 By Mouth Once Daily 4)  Fexofenadine Hcl 180 Mg Tabs (Fexofenadine Hcl) .... Take 1 Tablet By Mouth Once A Day 5)  Proair Hfa 108 (90 Base) Mcg/act Aers (Albuterol Sulfate) .... 2 Puffs Four Times A Day As Needed 6)  Astepro 0.15 % Soln (Azelastine Hcl) .Marland Kitchen.. 1-2 Puffs Each Nostril Up To Twice Daily As Needed 7)  Glucosamine Chondroit-Collagen  Caps (Glucos-Chondroit-Collag-Hyal) .... Once Daily 8)  Omeprazole 20 Mg Cpdr (Omeprazole) .... Take 1 Capsule By Mouth Once A Day Before Breakfast 9)  Delsym 30 Mg/43ml Lqcr (Dextromethorphan Polistirex) .... Per Bottle As Needed 10)  Vitamin C 500 Mg Tabs (Ascorbic Acid) .... Take 1 By Mouth Once Daily  Allergies (verified): 1)  ! * Cefdinir 2)  ! Sulfa  Past History:  Past Medical History: Last updated: 08/12/2009 Allergic Rhinitis Rhinosinusitis bronchitis Occular sarcoid  Past Surgical History: Last updated: 04/29/2009 None  Family History: Last updated: 04/29/2009 Father- unknown health status Mother- alive, asthma/ bronchitis, HTN,  CVA Daughter- allergies  Social History: Last updated: 04/29/2009 Patient never smoked.  Retired Runner, broadcasting/film/video  Risk Factors: Smoking Status: never (12/22/2009)  Review of Systems      See HPI       The patient complains of non-productive cough.  The patient  denies shortness of breath with activity, shortness of breath at rest, productive cough, coughing up blood, chest pain, irregular heartbeats, acid heartburn, indigestion, loss of appetite, weight change, abdominal pain, difficulty swallowing, sore throat, tooth/dental problems, headaches, nasal congestion/difficulty breathing through nose, and sneezing.    Vital Signs:  Patient profile:   60 year old female Height:      62 inches Weight:      139.38 pounds BMI:     25.59 O2 Sat:      99 % on Room air Pulse rate:   78 / minute BP sitting:   110 / 78  (right arm) Cuff size:   regular  Vitals Entered By: Reynaldo Minium CMA (December 22, 2009 10:07 AM)  O2 Flow:  Room air CC: 4 month follow up visit-asthma; cough, bronchitis.   Physical Exam  Additional Exam:  General: A/Ox3; pleasant and cooperative, NAD, relaxed and conversational SKIN: no rash, lesions NODES: no lymphadenopathy HEENT: [place]/AT, EOM- WNL, Conjuctivae- clear, PERRLA, TM-WNL, Nose- clear, Throat- Mallampati  III,  no visible drainage. Tongue coated. NECK: Supple w/ fair ROM, JVD- none, normal carotid impulses w/o bruits Thyroid- normal to palpation, hoarse CHEST CTA w/ no wheezing          no cough here today. HEART: RRR, no m/g/r heard ABDOMEN: Soft and nl;  HFW:YOVZ, nl pulses, no edema  NEURO: Grossly intact to observation      Impression & Recommendations:  Problem # 1:  COUGH (ICD-786.2) She looks comfortable today, making it harder for me to be sure what we are treating. Multifactorial cough syndrome. Episodic worsening has related to viral infection. We discussed Daliresp risk benefit and will try it to reduce incidence of bronchitis type symptoms. Will change Alvesco to somethng cheaper.   Problem # 2:  ALLERGIC RHINITIS (ICD-477.9) She is not feeling much head congestion or drainage now.  Her updated medication list for this problem includes:    Fexofenadine Hcl 180 Mg Tabs (Fexofenadine hcl) .Marland Kitchen... Take 1  tablet by mouth once a day    Astepro 0.15 % Soln (Azelastine hcl) .Marland Kitchen... 1-2 puffs each nostril up to twice daily as needed  Medications Added to Medication List This Visit: 1)  Vitamin C 500 Mg Tabs (Ascorbic acid) .... Take 1 by mouth once daily 2)  Qvar 40 Mcg/act Aers (Beclomethasone dipropionate) .... 2 puffs and rinse mouth, once daily 3)  Daliresp 500 Mcg Tabs (Roflumilast) .Marland Kitchen.. 1 daily with food 4)  Tramadol Hcl 50 Mg Tabs (Tramadol hcl) .Marland Kitchen.. 1 three times a day as needed cough 5)  Tussionex Pennkinetic Er 10-8 Mg/30ml Lqcr (Hydrocod polst-chlorphen polst) .Marland Kitchen.. 1 teaspoon two times a day as needed cough  Other Orders: Est. Patient Level IV (85885)  Patient Instructions: 1)  Please schedule a follow-up appointment in 1 month 2)  Flu vax 3)  Sample/ script Daliresp - 1 daily with food to reduce episodes of bronchitis 4)  Change Alvesco to sample/ script Qvar 80 - 2 puffs and rinse mouth, once daily as an airway antiinflammatory. 5)  script Tussionex for cough 6)  script Tramaddol for cough Prescriptions: TUSSIONEX PENNKINETIC ER 10-8 MG/5ML LQCR (HYDROCOD POLST-CHLORPHEN POLST) 1 teaspoon two times a day  as needed cough  #200 ml x 0   Entered and Authorized by:   Waymon Budge MD   Signed by:   Waymon Budge MD on 12/22/2009   Method used:   Print then Give to Patient   RxID:   [id number] TRAMADOL HCL 50 MG TABS (TRAMADOL HCL) 1 three times a day as needed cough  #30 x 3   Entered and Authorized by:   Waymon Budge MD   Signed by:   Waymon Budge MD on 12/22/2009   Method used:   Print then Give to Patient   RxID:   [id number] DALIRESP 500 MCG TABS (ROFLUMILAST) 1 daily with food  #30 x 0   Entered and Authorized by:   Waymon Budge MD   Signed by:   Waymon Budge MD on 12/22/2009   Method used:   Print then Give to Patient   RxID:   [id number] QVAR 40 MCG/ACT AERS (BECLOMETHASONE DIPROPIONATE) 2 puffs and rinse mouth, once daily  #1 x prn    Entered and Authorized by:   Waymon Budge MD   Signed by:   Waymon Budge MD on 12/22/2009   Method used:   Print then Give to Patient   RxID:   [id number]     Appended Document: rov 4 months///kp-FLU SHOT     Clinical Lists Changes  Orders: Added new Service order of Admin 1st Vaccine (38756) - Signed Added new Service order of Flu Vaccine 12yrs + [id number]) - Signed Observations: Added new observation of FLU VAX VIS: 10/07/09 version (12/22/2009 16:54) Added new observation of FLU VAXLOT: [id number] (12/22/2009 16:54) Added new observation of FLU VAXMFR: [employer] (12/22/2009 16:54) Added new observation of FLU VAX EXP: 09/12/2010 (12/22/2009 16:54) Added new observation of FLU VAX DSE: 0.49ml (12/22/2009 16:54) Added new observation of FLU VAX: Fluvax 3+ (12/22/2009 16:54) Flu Vaccine Consent Questions     Do you have a history of severe allergic reactions to this vaccine? no    Any prior history of allergic reactions to egg and/or gelatin? no    Do you have a sensitivity to the preservative Thimersol? no    Do you have a past history of Guillan-Barre Syndrome? no    Do you currently have an acute febrile illness? no    Have you ever had a severe reaction to latex? no    Vaccine information given and explained to patient? yes    Are you currently pregnant? no    Lot Number:[id number]   Exp Date:09/12/2010   Site Given  Left Deltoid IM   .lbflu Reynaldo Minium CMA  December 22, 2009 4:55 PM

## 2010-04-14 NOTE — Assessment & Plan Note (Signed)
Summary: 2 months/asthma flare/apc   Primary Provider/Referring Provider:  Jeanmarie Hubert  CC:  2 month follow up visit-asthma flare up.Marland Kitchen  History of Present Illness: April 29, 2009- Rhinitis, bronchitis, GERD 78 yoF never smoker, last seen 2006. Comes now to re-establish. Had flu vax. For 31/2 months has had cough and postnasal drip that started with a cold. PA at the ENT office took her off fexofenadine and gave Nasonex which hurts when sprayed in her nose. Had sinus Xray "not my sinuses".Frontal headache, sorethroat, no fever. Variable cough, sometimes with wheeze or deep, with clear mucus. Cough is keeping her from going out. She has a long hx of recurrent sinus and cough. Her eye doctor told her she has sarcoid in her eyes.  Hx pansinusiis on CT 2005. Lifelong recurrent rhinosinusitis and bronchitis. Skin test positive, was on allergy vaccine around 1999.  May 22, 2009- Rhinits, bronchitis, GERD Cefdinir gave diarrhea for 2 weeks. She is better but still feels something in throat and notes some cough. Denies fever, nodes, sore throat, nodes, wheeze or discolored sputum. Throat sensation started with the rest of her bronchitis about 3 months ago. Nasonex "hurts". Neti pot helps some.  June 19, 2009- Rhinitis, bronchitis, GERD,  Sarcoid/eye Cough is better- still some wheeze but not bad. She can tell that pollen bothers her if outside. Loratadine has helped her nose. Since finsihing cefdinir nothing seems infected.. Occasional frontal headache.  Jul 18, 2009- Rhinitis, bronchitis, GERD Over past 3 weeks has had gradually worsening cough, bitemporal headache, tussive anterior chest pain, clear mucus from nose and chest. Denies fever, sore throat, nodes, rash, purulence, blood or ankle edema. Has not really felt well in 2-3 months, but not able to pin down.  Aug 12, 2009- Rhinitis, bronchitis, GERD, Sarcoid/ eye Still cough all spring, clear mucus.Cough til retch, but denies GI upset.  Tussive soreness substernal. some wheeze. Alvesco and Proair help some. Rhinitis with postnasal drip, frontal headache. No fever or glands.Denies heartburn or reflux, purulrnt or blood. Cough makes her depressed, confining.   Asthma History    Asthma Control Assessment:    Age range: 12+ years    Symptoms: >2 days/week    Nighttime Awakenings: 1-3/week    Interferes w/ normal activity: no limitations    SABA use (not for EIB): >2 days/week    Asthma Control Assessment: Not Well Controlled   Preventive Screening-Counseling & Management  Alcohol-Tobacco     Smoking Status: never  Current Medications (verified): 1)  Toprol Xl 50 Mg Xr24h-Tab (Metoprolol Succinate) .... Take 1 1/2 By Mouth Once Daily 2)  Aspirin 81 Mg Tbec (Aspirin) .... Take 1 By Mouth Once Daily 3)  Multivitamins  Tabs (Multiple Vitamin) .... Take 1 By Mouth Once Daily 4)  Fish Oil 1200 Mg Caps (Omega-3 Fatty Acids) .... Take 1 By Mouth Once Daily 5)  Zyrtec Allergy 10 Mg Tbdp (Cetirizine Hcl) .Marland Kitchen.. 1 Daily As Needed Antihistamine 6)  Proair Hfa 108 (90 Base) Mcg/act Aers (Albuterol Sulfate) .... 2 Puffs Four Times A Day As Needed 7)  Astepro 0.15 % Soln (Azelastine Hcl) .Marland Kitchen.. 1-2 Puffs Each Nostril Up To Twice Daily As Needed 8)  Glucosamine Chondroit-Collagen  Caps (Glucos-Chondroit-Collag-Hyal) .... Once Daily 9)  Alvesco 80 Mcg/act Aers (Ciclesonide) .... 2 Puffs and Rinse Mouth, Twice Daily  Allergies (verified): 1)  ! * Cefdinir 2)  ! Sulfa  Past History:  Past Surgical History: Last updated: 04/29/2009 None  Family History: Last updated: 04/29/2009 Father- unknown  health status Mother- alive, asthma/ bronchitis, HTN, CVA Daughter- allergies  Social History: Last updated: 04/29/2009 Patient never smoked.  Retired Runner, broadcasting/film/video  Risk Factors: Smoking Status: never (08/12/2009)  Past Medical History: Allergic Rhinitis Rhinosinusitis bronchitis Occular sarcoid  Review of Systems      See HPI        The patient complains of productive cough, non-productive cough, chest pain, and headaches.  The patient denies shortness of breath with activity, shortness of breath at rest, coughing up blood, irregular heartbeats, acid heartburn, indigestion, loss of appetite, weight change, abdominal pain, difficulty swallowing, sore throat, tooth/dental problems, nasal congestion/difficulty breathing through nose, and sneezing.    Vital Signs:  Patient profile:   60 year old female Height:      62 inches Weight:      138 pounds BMI:     25.33 O2 Sat:      98 % on Room air Pulse rate:   74 / minute BP sitting:   118 / 68  (left arm) Cuff size:   regular  Vitals Entered By: Reynaldo Minium CMA (Aug 12, 2009 10:20 AM)  O2 Flow:  Room air  Physical Exam  Additional Exam:  General: A/Ox3; pleasant and cooperative, NAD, SKIN: no rash, lesions NODES: no lymphadenopathy HEENT: [place]/AT, EOM- WNL, Conjuctivae- clear, PERRLA, TM-WNL, Nose- clear, Throat- clear and wnl, Mallampati  II, some cobblestone, no visible drainage. Hoarse, no sridor NECK: Supple w/ fair ROM, JVD- none, normal carotid impulses w/o bruits Thyroid- normal to palpation CHEST:No cough or wheeze, dullness or rub. HEART: RRR, no m/g/r heard ABDOMEN: Soft and nl;  JWJ:XBJY, nl pulses, no edema  NEURO: Grossly intact to observation      Impression & Recommendations:  Problem # 1:  ALLERGIC RHINITIS (ICD-477.9) This may be mostly a "top-down" rhinosinusitis. We will check CBC, allergy profile and limited CT sinus before throwing more antibiotics at her. Her physical exam is not striking. Her updated medication list for this problem includes:    Zyrtec Allergy 10 Mg Tbdp (Cetirizine hcl) .Marland Kitchen... 1 daily as needed antihistamine    Astepro 0.15 % Soln (Azelastine hcl) .Marland Kitchen... 1-2 puffs each nostril up to twice daily as needed  Problem # 2:  ASTHMA (ICD-493.90)  Chest seems clear now. We didn't see active sarcoid in February. Weill give  tussionex and wait on lab results. The following medications were removed from the medication list:    Promethazine-codeine 6.25-10 Mg/51ml Syrp (Promethazine-codeine) .Marland Kitchen... 1 teaspoon four times a day as needed cough Her updated medication list for this problem includes:    Proair Hfa 108 (90 Base) Mcg/act Aers (Albuterol sulfate) .Marland Kitchen... 2 puffs four times a day as needed    Alvesco 80 Mcg/act Aers (Ciclesonide) .Marland Kitchen... 2 puffs and rinse mouth, twice daily    Tussionex Pennkinetic Er 8-10 Mg/49ml Lqcr (Chlorpheniramine-hydrocodone) .Marland Kitchen... 1 teaspoon two times a day as needed cough  Orders: Est. Patient Level IV (78295)  Medications Added to Medication List This Visit: 1)  Tussionex Pennkinetic Er 8-10 Mg/33ml Lqcr (Chlorpheniramine-hydrocodone) .Marland Kitchen.. 1 teaspoon two times a day as needed cough  Other Orders: TLB-CBC Platelet - w/Differential (85025-[id number]) T-Allergy Profile [place], [place], [place], [place], [place] [id number]) Radiology Referral (Radiology)  Patient Instructions: 1)  Please schedule a follow-up appointment in 1 month. 2)  Script printed for tussionex 3)  A Limited Sinus CT has been recommended.  Your imaging study may require preauthorization.  4)  Lab Prescriptions: TUSSIONEX PENNKINETIC ER 8-10 MG/5ML LQCR (CHLORPHENIRAMINE-HYDROCODONE) 1 teaspoon  two times a day as needed cough  #200 ml x 0   Entered and Authorized by:   Waymon Budge MD   Signed by:   Waymon Budge MD on 08/12/2009   Method used:   Print then Give to Patient   RxID:   [id number]

## 2010-04-14 NOTE — Progress Notes (Signed)
Summary: diarrhea  Phone Note Call from Patient Call back at Home Phone [phone]   Caller: Patient Call For: young Reason for Call: Talk to Nurse Summary of Call: having trouble with Cefednir - giving pt diarrhea.  Finished med Monday, but is still having diarrhea.  Please advise. Initial call taken by: Eugene Gavia,  May 07, 2009 2:56 PM  Follow-up for Phone Call        Spoke with pt and advised her to try imodium for diarrhea (she had not tried anything to help with this).  Pt agreed to try imodium and will call back if needed. Follow-up by: Vernie Murders,  May 07, 2009 3:00 PM

## 2010-04-14 NOTE — Assessment & Plan Note (Signed)
Summary: congested/Jessica Woodard   Primary Provider/Referring Provider:  Jeanmarie Hubert  CC:  cough and congestion;wheezing sounds at times, sore chest, and "labored breathing".  History of Present Illness: April 29, 2009- Rhinitis, bronchitisw, GERD 60 yoF never smoker, last seen 2006. Comes now to re-establish. Had flu vax. For 31/2 months has had cough and postnasal drip that started with a cold. PA at the ENT office took her off fexofenadine and gave Nasonex which hurts when sprayed in her nose. Had sinus Xray "not my sinuses".Frontal headache, sorethroat, no fever. Variable cough, sometimes with wheeze or deep, with clear mucus. Cough is keeping her from going out. She has a long hx of recurrent sinus and cough. Her eye doctor told her she has sarcoid in her eyes.  Hx pansinusiis on CT 2005. Lifelong recurrent rhinosinusitis and bronchitis. Skin test positive, was on allergy vaccine around 1999.   Preventive Screening-Counseling & Management  Alcohol-Tobacco     Smoking Status: never  Current Medications (verified): 1)  Toprol Xl 50 Mg Xr24h-Tab (Metoprolol Succinate) .... Take 1 1/2 By Mouth Once Daily 2)  Aspirin 81 Mg Tbec (Aspirin) .... Take 1 By Mouth Once Daily 3)  Nasonex 50 Mcg/act Susp (Mometasone Furoate) .Marland Kitchen.. 1-2 Sprays in Each Nostril Daily 4)  Multivitamins  Tabs (Multiple Vitamin) .... Take 1 By Mouth Once Daily 5)  Fish Oil 1200 Mg Caps (Omega-3 Fatty Acids) .... 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  The patient denies anorexia,  fever, weight loss, weight gain, vision loss, decreased hearing, hoarseness, chest pain, syncope, dyspnea on exertion, peripheral edema, hemoptysis, abdominal pain, and severe indigestion/heartburn.    Vital Signs:  Patient profile:   60 year old female Height:      62 inches Weight:      138.13 pounds BMI:     25.36 O2 Sat:      99 % on Room air Pulse rate:   64 / minute BP sitting:   128 / 84  (left arm) Cuff size:   regular  Vitals Entered By: Reynaldo Minium CMA (April 29, 2009 4:24 PM)  O2 Flow:  Room air  Physical Exam  Additional Exam:  General: A/Ox3; pleasant and cooperative, NAD, SKIN: no rash, lesions NODES: no lymphadenopathy HEENT: [place]/AT, EOM- WNL, Conjuctivae- clear, PERRLA, TM-WNL, Nose- clear, Throat- clear and wnl, [place]  II, yellow postnasal drip NECK: Supple w/ fair ROM, JVD- none, normal carotid impulses w/o bruits Thyroid- normal to palpation CHEST: Clear to P&A, no cough here HEART: RRR, no m/g/r heard ABDOMEN: Soft and nl;  ZOX:WRUE, nl pulses, no edema  NEURO: Grossly intact to observation      Impression & Recommendations:  Problem # 1:  BRONCHITIS, ACUTE (ICD-466.0)  She has longstanding hx of recurrent bronchitis usually appearing as secondary to rhinitis and drainage. We will check for sarcoid based on her opthalmologist's comment. whether some of this should be called asthma is unclear. Reflux never apparent.  Her updated medication list for this problem includes:    Cefdinir 300 Mg Caps (Cefdinir) .Marland Kitchen... 2 daily  Promethazine-codeine 6.25-10 Mg/64ml Syrp (Promethazine-codeine) .Marland Kitchen... 1 teaspoon four times a day as needed cough  Problem # 2:  RHINITIS (ICD-472.0) An atopic component and previous pansiusitis have been evident in the past. Nasonex is burning to her nose, and sinus xray (not CT) was neg at her ENT office by PA. We will have her try Neti pot.  Medications Added to Medication List This Visit: 1)  Toprol Xl 50 Mg Xr24h-tab  (Metoprolol succinate) .... Take 1 1/2 by mouth once daily 2)  Aspirin 81 Mg Tbec (Aspirin) .... Take 1 by mouth once daily 3)  Nasonex 50 Mcg/act Susp (Mometasone furoate) .Marland Kitchen.. 1-2 sprays in each nostril daily 4)  Multivitamins Tabs (Multiple vitamin) .... Take 1 by mouth once daily 5)  Fish Oil 1200 Mg Caps (Omega-3 fatty acids) .... Take 1 by mouth once daily 6)  Cefdinir 300 Mg Caps (Cefdinir) .... 2 daily 7)  Promethazine-codeine 6.25-10 Mg/59ml Syrp (Promethazine-codeine) .Marland Kitchen.. 1 teaspoon four times a day as needed cough  Other Orders: Est. Patient Level III (65784) T-Angiotensin i-Converting Enzyme ([id number]) T-2 View CXR (71020TC)  Patient Instructions: 1)  Please schedule a follow-up appointment in 3 weeks. 2)  script for cefdinir antibiotic 3)  Script for cough syrup 4)  Lab 5)  A chest x-ray has been recommended.  Your imaging study may require preauthorization.  6)  Try Neti pot (otc at drug store) to rinse your nasal passages. Prescriptions: PROMETHAZINE-CODEINE 6.25-10 MG/5ML SYRP (PROMETHAZINE-CODEINE) 1 teaspoon four times a day as needed cough  #200 ml x 0   Entered and Authorized by:   Waymon Budge MD   Signed by:   Waymon Budge MD on 04/29/2009   Method used:   Print then Give to Patient   RxID:   [id number] CEFDINIR 300 MG CAPS (CEFDINIR) 2 daily  #14 x 0   Entered and Authorized by:   Waymon Budge MD   Signed by:   Waymon Budge MD on 04/29/2009   Method used:   Print then Give to Patient   RxID:   [id number]

## 2010-04-14 NOTE — Assessment & Plan Note (Signed)
Summary: rov 3 wks ///kp   Primary Provider/Referring Provider:  Jeanmarie Hubert  CC:  3 week follow up visit.  History of Present Illness: History of Present Illness: April 29, 2009- Rhinitis, bronchitis, GERD 38 yoF never smoker, last seen 2006. Comes now to re-establish. Had flu vax. For 31/2 months has had cough and postnasal drip that started with a cold. PA at the ENT office took her off fexofenadine and gave Nasonex which hurts when sprayed in her nose. Had sinus Xray "not my sinuses".Frontal headache, sorethroat, no fever. Variable cough, sometimes with wheeze or deep, with clear mucus. Cough is keeping her from going out. She has a long hx of recurrent sinus and cough. Her eye doctor told her she has sarcoid in her eyes.  Hx pansinusiis on CT 2005. Lifelong recurrent rhinosinusitis and bronchitis. Skin test positive, was on allergy vaccine around 1999.  May 22, 2009- Rhinits, bronchitis, GERD Cefdinir gave diarrhea for 2 weeks. She is better but still feels something in throat and notes some cough. Denies fever, nodes, sore throat, nodes, wheeze or discolored sputum. Throat sensation started with the rest of her bronchitis about 3 months ago. Nasonex "hurts". Neti pot helps some.    Current Medications (verified): 1)  Toprol Xl 50 Mg Xr24h-Tab (Metoprolol Succinate) .... Take 1 1/2 By Mouth Once Daily 2)  Aspirin 81 Mg Tbec (Aspirin) .... Take 1 By Mouth Once Daily 3)  Nasonex 50 Mcg/act Susp (Mometasone Furoate) .Marland Kitchen.. 1-2 Sprays in Each Nostril Daily 4)  Multivitamins  Tabs (Multiple Vitamin) .... Take 1 By Mouth Once Daily 5)  Fish Oil 1200 Mg Caps (Omega-3 Fatty Acids) .... Take 1 By Mouth Once Daily  Allergies (verified): 1)  ! * Cefdinir  Past History:  Past Medical History: Last updated: 04/29/2009 Allergic Rhinitis Rhinosinusitis bronchitis  Past Surgical History: Last updated: 04/29/2009 None  Family History: Last updated: 04/29/2009 Father- unknown health  status Mother- alive, asthma/ bronchitis, HTN, CVA Daughter- allergies  Social History: Last updated: 04/29/2009 Patient never smoked.  Retired Runner, broadcasting/film/video  Risk Factors: Smoking Status: never (04/29/2009)  Review of Systems      See HPI  The patient denies anorexia, fever, weight loss, weight gain, vision loss, decreased hearing, hoarseness, chest pain, syncope, dyspnea on exertion, peripheral edema, prolonged cough, headaches, hemoptysis, abdominal pain, and severe indigestion/heartburn.    Vital Signs:  Patient profile:   60 year old female Height:      62 inches Weight:      139.13 pounds BMI:     25.54 O2 Sat:      97 % on Room air Pulse rate:   65 / minute BP sitting:   128 / 82  (left arm) Cuff size:   regular  Vitals Entered By: Reynaldo Minium CMA (May 22, 2009 9:55 AM)  O2 Flow:  Room air  Physical Exam  Additional Exam:  General: A/Ox3; pleasant and cooperative, NAD, SKIN: no rash, lesions NODES: no lymphadenopathy HEENT: [place]/AT, EOM- WNL, Conjuctivae- clear, PERRLA, TM-WNL, Nose- clear, Throat- clear and wnl, Mallampati  II, not red and no pnd NECK: Supple w/ fair ROM, JVD- none, normal carotid impulses w/o bruits Thyroid- normal to palpation CHEST: Clear to P&A, no cough here HEART: RRR, no m/g/r heard ABDOMEN: Soft and nl;  LOV:FIEP, nl pulses, no edema  NEURO: Grossly intact to observation      Impression & Recommendations:  Problem # 1:  ALLERGIC RHINITIS (ICD-477.9)  We discussed alternatives. Shje used to take fexofenadine until  it stopped working.Dry cough remains bothersome and we may want to try a steroid inhaler if it persists. The following medications were removed from the medication list:    Nasonex 50 Mcg/act Susp (Mometasone furoate) .Marland Kitchen... 1-2 sprays in each nostril daily  Problem # 2:  BRONCHITIS, ACUTE (ICD-466.0) This has been a recurrent problem at times in her life but controlled for now. The following medications were removed from  the medication list:    Cefdinir 300 Mg Caps (Cefdinir) .Marland Kitchen... 2 daily    Promethazine-codeine 6.25-10 Mg/66ml Syrp (Promethazine-codeine) .Marland Kitchen... 1 teaspoon four times a day as needed cough  Other Orders: Est. Patient Level III (27062)  Patient Instructions: 1)  Please schedule a follow-up appointment in 1 month. 2)  Try sample Astepro nasal antihistamine spray: 1-2 puffs each nostril up to two times a day as needed  3)  You can stop using Nasonex for now. 4)  Try otc antihistamine- allegra/fexofenadine or claritin/ loratadine 5)  You might check out otc nasal spray Nasalcrom/ cromolyn

## 2010-04-14 NOTE — Progress Notes (Signed)
Summary: skin test  Phone Note Call from Patient Call back at Home Phone [phone]   Caller: Patient Call For: Ezio Wieck Summary of Call: If you open another Wed afternoon for skin testing, please call pt.  She would like to get in sooner. Initial call taken by: Eugene Gavia,  August 15, 2009 1:48 PM  Follow-up for Phone Call        [facility].Reynaldo Minium CMA  August 20, 2009 2:07 PM   Spoke with pt-scheduled alt appt for 09-23-09 at 2 pm; pt aware of allergy testing protocol.Orginal appt cancelled.Reynaldo Minium CMA  August 20, 2009 2:53 PM

## 2010-04-14 NOTE — Progress Notes (Signed)
Summary: results  Phone Note Call from Patient Call back at Home Phone [phone] Call back at [phone] cell   Caller: Patient Call For: young Reason for Call: Talk to Nurse, Lab or Test Results Summary of Call: calling for results of test. Initial call taken by: Eugene Gavia,  May 05, 2009 10:14 AM  Follow-up for Phone Call        cxr and labs printed. please advise. Carron Curie CMA  May 05, 2009 10:27 AM   Additional Follow-up for Phone Call Additional follow up Details #1::        Ready Additional Follow-up by: Waymon Budge MD,  May 05, 2009 9:08 PM    Additional Follow-up for Phone Call Additional follow up Details #2::    msg left for pt tcb Vernie Murders  May 06, 2009 4:11 PM  called and spoke with pt.  pt aware of cxr and lab results.  Aundra Millet Reynolds LPN  May 06, 2009 4:27 PM

## 2010-04-20 ENCOUNTER — Encounter: Payer: Self-pay | Admitting: Internal Medicine

## 2010-04-20 ENCOUNTER — Encounter (INDEPENDENT_AMBULATORY_CARE_PROVIDER_SITE_OTHER): Payer: BC Managed Care – PPO

## 2010-04-20 DIAGNOSIS — R059 Cough, unspecified: Secondary | ICD-10-CM

## 2010-04-20 DIAGNOSIS — J45909 Unspecified asthma, uncomplicated: Secondary | ICD-10-CM

## 2010-04-20 DIAGNOSIS — R05 Cough: Secondary | ICD-10-CM

## 2010-04-22 NOTE — Assessment & Plan Note (Signed)
Summary: ROV FOR COUGH//SH   Primary Provider/Referring Provider:  Jeanmarie Hubert  CC:  Acute visit-cough-prod-clear, SOB, and drainage.Marland Kitchen  History of Present Illness: October 30, 2009--Presents for a 3 week follow up asthma, states symptoms have improved but still having some prod cough with clear mucus and PND but only occasionally. She feels much better w/ decreased cough. She was tx last visit for recurrent bronchitis and cough with steroids and cough suppression regimen w/ delsym/tramadol. Hoarseness is better but not totally resolved. Denies chest pain, dyspnea, orthopnea, hemoptysis, fever, n/v/d, edema, headache.   December 22, 2009- Rhinitis, bronchitis, GERD, Sarcopid/ eye cc:4 month follow up visit-asthma; cough, bronchitis. Saw NP last 2 times, treated for upper airway cough syndrome with voice rest, cough suppresion. She was doing better then got sick with sore throat on trip back. Had fever and worse cough. She is getting her voice back slowly after 3 weeks, but is coughing till she retches, bringing up mucus. Out of tramadol. Has been using Alvesco two times a day, using Astepro and proair as needed. She has had more cough and respiratory complaints since last winter  April 13, 2010-  Rhinitis, bronchitis, GERD, Sarcoid/ eye..........................female companion here Nurse-CC: Acute visit-cough-prod-clear,SOB,drainage. She says she feels the same malaise she has had over a year, pointing to drainage making her cough. She mostly feels this as a pepper-like postnasal drip. Chest gets sore from coughing and sometimes hears wheeze or croupy cough from chest.  Tussionex and Tramadol help Delsym only lasts a few hours now. We reviewed the serial normal cxr, allergy tests, sinus CT done over the past year. Denies relation to weather, position, eating, location.     Preventive Screening-Counseling & Management  Alcohol-Tobacco     Smoking Status: never  Current Medications  (verified): 1)  Toprol Xl 50 Mg Xr24h-Tab (Metoprolol Succinate) .... Take 1 1/2 By Mouth Once Daily 2)  Aspirin 81 Mg Tbec (Aspirin) .... Take 1 By Mouth Once Daily 3)  Multivitamins  Tabs (Multiple Vitamin) .... Take 1 By Mouth Once Daily 4)  Fexofenadine Hcl 180 Mg Tabs (Fexofenadine Hcl) .... Take 1 Tablet By Mouth Once A Day 5)  Proair Hfa 108 (90 Base) Mcg/act Aers (Albuterol Sulfate) .... 2 Puffs Four Times A Day As Needed 6)  Astepro 0.15 % Soln (Azelastine Hcl) .Marland Kitchen.. 1-2 Puffs Each Nostril Up To Twice Daily As Needed 7)  Glucosamine Chondroit-Collagen  Caps (Glucos-Chondroit-Collag-Hyal) .... Once Daily 8)  Delsym 30 Mg/82ml Lqcr (Dextromethorphan Polistirex) .... Per Bottle As Needed 9)  Vitamin C 500 Mg Tabs (Ascorbic Acid) .... Take 1 By Mouth Once Daily 10)  Qvar 40 Mcg/act Aers (Beclomethasone Dipropionate) .... 2 Puffs and Rinse Mouth, Once Daily 11)  Tramadol Hcl 50 Mg Tabs (Tramadol Hcl) .Marland Kitchen.. 1 Three Times A Day As Needed Cough  Allergies (verified): 1)  ! * Cefdinir 2)  ! Sulfa  Social History: Patient never smoked.  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  The patient denies anorexia, fever, weight loss, weight gain, vision loss, decreased hearing, hoarseness, chest pain, syncope, dyspnea on exertion, peripheral edema, headaches, hemoptysis, abdominal pain, severe indigestion/heartburn, muscle weakness, difficulty walking, unusual weight change, abnormal bleeding, enlarged lymph nodes, and angioedema.    Vital Signs:  Patient profile:   60 year old female Height:      62 inches Weight:  138.50 pounds BMI:     25.42 O2 Sat:      98 % on Room air Pulse rate:   78 / minute BP sitting:   126 / 78  (left arm) Cuff size:   regular  Vitals Entered By: Reynaldo Minium CMA (April 13, 2010 1:36 PM)  O2 Flow:  Room air CC: Acute  visit-cough-prod-clear,SOB,drainage.   Physical Exam  Additional Exam:  General: A/Ox3; pleasant and cooperative, NAD, relaxed and conversational SKIN: no rash, lesions NODES: no lymphadenopathy HEENT: [place]/AT, EOM- WNL, Conjuctivae- clear, PERRLA, TM-WNL, Nose- clear, Throat- Mallampati  III,  no visible drainage. Tongue coated. NECK: Supple w/ fair ROM, JVD- none, normal carotid impulses w/o bruits Thyroid- normal to palpation, hoarse CHEST CTA w/ no wheezing          no cough here today. HEART: RRR, no m/g/r heard ABDOMEN: Soft and nl;  HKV:QQVZ, nl pulses, no edema  NEURO: Grossly intact to observation      Impression & Recommendations:  Problem # 1:  COUGH (ICD-786.2)  Nonspecific chronic cough. She feels a postnasal drip I can't see and doesn't feel any reflux.  She is concerned she might have COPD after hx marriage to a smoker.  We will get PFT, looking also for reactive airways disease.  Consider barium swallow, retrun to ENT for a reevaluation. We will try anticholinergic ipratropium nasal spray. She can drop others she hasn't found effective.   Medications Added to Medication List This Visit: 1)  Ipratropium Bromide 0.06 % Soln (Ipratropium bromide) .Marland Kitchen.. 1-2 puffs each nostril three times a day as needed  Other Orders: Est. Patient Level III (56387)  Patient Instructions: 1)  Please schedule a follow-up appointment in 1 month. 2)  Schedule PFT 3)  Refill Tramadol 4)  Script for ipratropium nasal spray Prescriptions: TRAMADOL HCL 50 MG TABS (TRAMADOL HCL) 1 three times a day as needed cough  #30 x 3   Entered and Authorized by:   Waymon Budge MD   Signed by:   Waymon Budge MD on 04/13/2010   Method used:   Print then Give to Patient   RxID:   [id number] IPRATROPIUM BROMIDE 0.06 % SOLN (IPRATROPIUM BROMIDE) 1-2 puffs each nostril three times a day as needed  #1 x prn    Entered and Authorized by:   Waymon Budge MD   Signed by:   Waymon Budge MD  on 04/13/2010   Method used:   Print then Give to Patient   RxID:   [id number]

## 2010-04-30 NOTE — Assessment & Plan Note (Signed)
Summary: pft   Allergies: 1)  ! * Cefdinir 2)  ! Sulfa   Other Orders: Carbon Monoxide diffusing w/capacity (84696) Lung Volumes/Gas dilution or washout (29528) Spirometry (Pre & Post) [phone])

## 2010-05-14 ENCOUNTER — Encounter: Payer: Self-pay | Admitting: Internal Medicine

## 2010-05-14 ENCOUNTER — Encounter (INDEPENDENT_AMBULATORY_CARE_PROVIDER_SITE_OTHER): Payer: Self-pay | Admitting: *Deleted

## 2010-05-14 ENCOUNTER — Ambulatory Visit (INDEPENDENT_AMBULATORY_CARE_PROVIDER_SITE_OTHER): Payer: BC Managed Care – PPO | Admitting: Internal Medicine

## 2010-05-14 ENCOUNTER — Other Ambulatory Visit: Payer: Self-pay | Admitting: Internal Medicine

## 2010-05-14 ENCOUNTER — Other Ambulatory Visit: Payer: BC Managed Care – PPO

## 2010-05-14 DIAGNOSIS — J45909 Unspecified asthma, uncomplicated: Secondary | ICD-10-CM

## 2010-05-14 DIAGNOSIS — R059 Cough, unspecified: Secondary | ICD-10-CM

## 2010-05-14 DIAGNOSIS — D869 Sarcoidosis, unspecified: Secondary | ICD-10-CM

## 2010-05-14 DIAGNOSIS — R05 Cough: Secondary | ICD-10-CM

## 2010-05-14 DIAGNOSIS — K219 Gastro-esophageal reflux disease without esophagitis: Secondary | ICD-10-CM

## 2010-05-14 LAB — CBC WITH DIFFERENTIAL/PLATELET
Eosinophils Relative: 7.6 % — ABNORMAL HIGH (ref 0.0–5.0)
HCT: 39.8 % (ref 36.0–46.0)
Lymphs Abs: 2.7 10*3/uL (ref 0.7–4.0)
MCHC: 34.5 g/dL (ref 30.0–36.0)
MCV: 92.3 fl (ref 78.0–100.0)
Monocytes Absolute: 0.5 10*3/uL (ref 0.1–1.0)
Platelets: 213 10*3/uL (ref 150.0–400.0)
WBC: 6.5 10*3/uL (ref 4.5–10.5)

## 2010-05-21 LAB — CONVERTED CEMR LAB: Anti Nuclear Antibody(ANA): POSITIVE — AB

## 2010-05-21 NOTE — Assessment & Plan Note (Signed)
Summary: rov 1 month ///kp   Primary Provider/Referring Provider:  Jeanmarie Hubert  CC:  1 month follow up visit-still coughing; Review PFT results..  History of Present Illness:  December 22, 2009- Rhinitis, bronchitis, GERD, Sarcopid/ eye cc:4 month follow up visit-asthma; cough, bronchitis. Saw NP last 2 times, treated for upper airway cough syndrome with voice rest, cough suppresion. She was doing better then got sick with sore throat on trip back. Had fever and worse cough. She is getting her voice back slowly after 3 weeks, but is coughing till she retches, bringing up mucus. Out of tramadol. Has been using Alvesco two times a day, using Astepro and proair as needed. She has had more cough and respiratory complaints since last winter  April 13, 2010-  Rhinitis, bronchitis, GERD, Sarcoid/ eye..........................female companion here Nurse-CC: Acute visit-cough-prod-clear,SOB,drainage. She says she feels the same malaise she has had over a year, pointing to drainage making her cough. She mostly feels this as a pepper-like postnasal drip. Chest gets sore from coughing and sometimes hears wheeze or croupy cough from chest.  Tussionex and Tramadol help Delsym only lasts a few hours now. We reviewed the serial normal cxr, allergy tests, sinus CT done over the past year. Denies relation to weather, position, eating, location.   May 14, 2010-  Rhinitis, bronchitis, GERD, Sarcoid/ eye Nurse-CC: 1 month follow up visit-still coughing; Review PFT results. Not as much cough, on meds. Ipratropium nasal spray helps some, used once daily. Throat ticklecough bothers her the most. Scant clear mucus from nose and chest. Denies any sense of reflux/ heartburn and saw no difference with trial of PPI. Everything helps some, but has to be ongoing. She asks if cough could be from Toprol. PFT-04/20/10- minimal small airway reversible obstruction, DLCO 69%.   Preventive Screening-Counseling &  Management  Alcohol-Tobacco     Smoking Status: never  Current Medications (verified): 1)  Toprol Xl 50 Mg Xr24h-Tab (Metoprolol Succinate) .... Take 1 1/2 By Mouth Once Daily 2)  Aspirin 81 Mg Tbec (Aspirin) .... Take 1 By Mouth Once Daily 3)  Multivitamins  Tabs (Multiple Vitamin) .... Take 1 By Mouth Once Daily 4)  Fexofenadine Hcl 180 Mg Tabs (Fexofenadine Hcl) .... Take 1 Tablet By Mouth Once A Day 5)  Proair Hfa 108 (90 Base) Mcg/act Aers (Albuterol Sulfate) .... 2 Puffs Four Times A Day As Needed 6)  Astepro 0.15 % Soln (Azelastine Hcl) .Marland Kitchen.. 1-2 Puffs Each Nostril Up To Twice Daily As Needed 7)  Delsym 30 Mg/69ml Lqcr (Dextromethorphan Polistirex) .... Per Bottle As Needed 8)  Vitamin C 500 Mg Tabs (Ascorbic Acid) .... Take 1 By Mouth Once Daily 9)  Qvar 40 Mcg/act Aers (Beclomethasone Dipropionate) .... 2 Puffs and Rinse Mouth, Once Daily 10)  Tramadol Hcl 50 Mg Tabs (Tramadol Hcl) .Marland Kitchen.. 1 Three Times A Day As Needed Cough 11)  Ipratropium Bromide 0.06 % Soln (Ipratropium Bromide) .Marland Kitchen.. 1-2 Puffs Each Nostril Three Times A Day As Needed  Allergies (verified): 1)  ! * Cefdinir 2)  ! Sulfa  Past History:  Past Surgical History: Last updated: 04/29/2009 None  Family History: Last updated: 04/29/2009 Father- unknown health status Mother- alive, asthma/ bronchitis, HTN, CVA Daughter- allergies  Social History: Last updated: 04/13/2010 Patient never smoked.  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  The patient denies shortness of breath at rest, productive cough, coughing up blood, chest pain, irregular heartbeats, acid heartburn,  indigestion, loss of appetite, weight change, abdominal pain, difficulty swallowing, sore throat, tooth/dental problems, headaches, nasal congestion/difficulty breathing through nose, and sneezing.    Vital Signs:  Patient profile:   60 year old female Height:      62 inches Weight:      137.25 pounds BMI:     25.19 O2 Sat:      98 % on Room air Pulse rate:   68 / minute BP sitting:   110 / 74  (left arm) Cuff size:   regular  Vitals Entered By: Reynaldo Minium CMA (May 14, 2010 9:05 AM)  O2 Flow:  Room air CC: 1 month follow up visit-still coughing; Review PFT results.   Physical Exam  Additional Exam:  General: A/Ox3; pleasant and cooperative, NAD, relaxed and conversational SKIN: no rash, lesions NODES: no lymphadenopathy HEENT: [place]/AT, EOM- WNL, Conjuctivae- clear, PERRLA, TM-WNL, Nose- clear, Throat- Mallampati  III,  no visible drainage.  NECK: Supple w/ fair ROM, JVD- none, normal carotid impulses w/o bruits Thyroid- normal to palpation, hoarse CHEST CTA w/ no wheezing          no cough here today. HEART: RRR, no m/g/r heard ABDOMEN: Soft and nl;  ZOX:WRUE, nl pulses, no edema  NEURO: Grossly intact to observation      Impression & Recommendations:  Problem # 1:  COUGH (ICD-786.2)  We will check possibility that toprol causes cough by replacing for 1 month w/ samples of Exforge 5-160.  Check ANA and ACE.  Orders: Est. Patient Level IV (45409) T-Antinuclear Antib (ANA) [phone]) T-Angiotensin i-Converting Enzyme ([id number])  Problem # 2:  ASTHMA (ICD-493.90) PFT shows some reactivity in small airways. she is not obese, has no hx pneumonia or DVT. I can't explain low DLCO. Not anemic. We don't see overt sarcoid changes in lungs, althugh occular sarcoid had been dx'd elsewhere. . Will check ANA.  We will Qvar to 80  Medications Added to Medication List This Visit: 1)  Qvar 80 Mcg/act Aers (Beclomethasone dipropionate) .... 2 puffs and rinse mouth well,  twice daily 2)  Exforge 5-160 Mg Tabs (Amlodipine besylate-valsartan) .Marland Kitchen.. 1 daily for bp try instead of toprol  Other Orders: TLB-CBC Platelet - w/Differential ([id number])  Patient Instructions: 1)  Please schedule a follow-up appointment in 1 month. 2)  Try samples of Exforge 5-160 3)       1 daily 4)  instead of Toprol. See if cough improves. If you run out, go back on toprol. 5)  Lab 6)  Sample/ script- change Qva rsteroid inhaler to  Qvar 80 7)        2 puffs and rinse moth well, twice daily. 8)        Prescriptions: QVAR 80 MCG/ACT AERS (BECLOMETHASONE DIPROPIONATE) 2 puffs and rinse mouth well, twice daily  #1 x prn   Entered and Authorized by:   Waymon Budge MD   Signed by:   Waymon Budge MD on 05/14/2010   Method used:   Print then Give to Patient   RxID:   [id number] EXFORGE 5-160 MG TABS (AMLODIPINE BESYLATE-VALSARTAN) 1 daily for BP try instead of toprol  #28 x 0   Entered and Authorized by:  Waymon Budge MD   Signed by:   Waymon Budge MD on 05/14/2010   Method used:   Historical   RxID:   [id number]

## 2010-06-10 ENCOUNTER — Telehealth: Payer: Self-pay | Admitting: Internal Medicine

## 2010-06-10 MED ORDER — AMLODIPINE BESYLATE-VALSARTAN 5-160 MG PO TABS: 1.0000 | ORAL_TABLET | Freq: Every day | ORAL | Status: AC

## 2010-06-10 NOTE — Telephone Encounter (Signed)
Called spoke with patient who states the exforge she received samples of at last ov seems to be working and the cough seems to be improving.  Requesting CDY's recs as to continue the exforge 5-160mg  or to go back on the metoprolol.  Please advise, thanks.  [facility] [place].

## 2010-06-10 NOTE — Telephone Encounter (Signed)
Per CDY-we can give Rx enough of the Exforge we sampled for her to take till her next PCP appt-that will be who needs to manage her BP and give RX's. Jessica Woodard

## 2010-06-10 NOTE — Telephone Encounter (Signed)
Called spoke with patient, informed her of CDY's recs as stated above.  Pt verbalized her understanding and states that she has an appt w/ her PCP in June.  2 refills of exforge 5-160mg  sent to pt's verified pharmacy.

## 2010-06-17 LAB — BASIC METABOLIC PANEL
CO2: 29 mEq/L (ref 19–32)
Glucose, Bld: 114 mg/dL — ABNORMAL HIGH (ref 70–99)
Potassium: 4.3 mEq/L (ref 3.5–5.1)
Sodium: 141 mEq/L (ref 135–145)

## 2010-06-23 LAB — BASIC METABOLIC PANEL
GFR calc Af Amer: >60 mL/min (ref >60)
GFR calc non Af Amer: >60 mL/min (ref >60)
Potassium: 4.9 mEq/L (ref 3.5–5.1)
Sodium: 142 mEq/L (ref 135–145)

## 2010-06-23 LAB — POCT HEMOGLOBIN-HEMACUE: Hemoglobin: 14.3 g/dL (ref 12.0–15.0)

## 2010-06-30 ENCOUNTER — Encounter: Payer: Self-pay | Admitting: Internal Medicine

## 2010-07-02 ENCOUNTER — Encounter: Payer: Self-pay | Admitting: Internal Medicine

## 2010-07-02 ENCOUNTER — Ambulatory Visit (INDEPENDENT_AMBULATORY_CARE_PROVIDER_SITE_OTHER): Payer: BC Managed Care – PPO | Admitting: Internal Medicine

## 2010-07-02 VITALS — BP 112/80 | HR 60 | Ht 62.0 in | Wt 140.0 lb

## 2010-07-02 DIAGNOSIS — J45909 Unspecified asthma, uncomplicated: Secondary | ICD-10-CM

## 2010-07-02 DIAGNOSIS — D869 Sarcoidosis, unspecified: Secondary | ICD-10-CM

## 2010-07-02 DIAGNOSIS — R05 Cough: Secondary | ICD-10-CM

## 2010-07-02 NOTE — Progress Notes (Signed)
  Subjective:    Patient ID: Jessica Woodard, female    DOB: 10-Mar-1951, 60 y.o.   MRN: [id number]  HPI 60 yo never smoker with chronic cough best explained as asthma. Hx of occular sarcoid. Not shown to have GERD. Delsym and tramadol do help cough but still needs to take something. Tramadol once daily lasts all day. ANA 1:40, ACE 54 (8-52), EOS 7.6%. PFT FEB 6 had shown normal flows except reversible obstruction in small airways.  Uses Qvar 80 2 puffs twice daily and Proair about once per week.  Eyes were rechecked last month finding no active sarcoid there now.    Review of Systems See HPI Constitutional:   No weight loss, night sweats,  Fevers, chills, fatigue, lassitude. HEENT: No-  Difficulty swallowing,  Tooth/dental problems,  Sore throat,                No sneezing, itching, ear ache, nasal congestion, post nasal drip,   CV:  No chest pain,  Orthopnea, PND, swelling in lower extremities, anasarca, dizziness, palpitations  GI  No heartburn, indigestion, abdominal pain, nausea, vomiting, diarrhea, change in bowel habits, loss of appetite  Resp: No shortness of breath with exertion or at rest.  No excess mucus,,  No coughing up of blood.  No change in color of mucus.  No wheezing.  Skin: no rash or lesions.  GU: no dysuria, change in color of urine, no urgency or frequency.  No flank pain.  MS:  No joint pain or swelling.  No decreased range of motion.  No back pain.  Psych:  No change in mood or affect. No depression or anxiety.  No memory loss.      Objective:   Physical Exam General- Alert, Oriented, Affect-appropriate, Distress- none acute. No spontaneous cough while here  Skin- rash-none, lesions- none, excoriation- none  Lymphadenopathy- none  Head- atraumatic  Eyes- Gross vision intact, PERRLA, conjunctivae clear secretions  Ears- Hearing normal canals, Tm L ,   R ,  Nose- Clear,  No-Septal dev, mucus, polyps, erosion, perforation   Throat- Mallampati II ,  mucosa clear , drainage- none, tonsils- atrophic  Neck- flexible , trachea midline, no stridor , thyroid nl, carotid no bruit  Chest - symmetrical excursion , unlabored     Heart/CV- RRR , no murmur , no gallop  , no rub, nl s1 s2                     - JVD- none , edema- none, stasis changes- none, varices- none     Lung- clear to P&A-      Sounds seem coarser left base---, wheeze- none, cough- none , dullness-none, rub- none     Chest wall-   Abd- tender-no, distended-no, bowel sounds-present, HSM- no  Br/ Gen/ Rectal- Not done, not indicated  Extrem- cyanosis- none, clubbing, none, atrophy- none, strength- nl  Neuro- grossly intact to observation        Assessment & Plan:

## 2010-07-02 NOTE — Patient Instructions (Signed)
Sample Spiriva- one daily. If this makes a real difference, then we can get you a prescription and you can begin backing off on the cough syrup and Tramadol.

## 2010-07-02 NOTE — Assessment & Plan Note (Signed)
Probably multifactorial. We will let her try Spiriva . If this is helpful she can back off on direct cough suppressants.  I doubt pulmonary sarcoid at her age but will watch CXR and ACE next visit.

## 2010-07-05 ENCOUNTER — Encounter: Payer: Self-pay | Admitting: Internal Medicine

## 2010-07-05 NOTE — Assessment & Plan Note (Addendum)
Some response to bronchodilators has been best clue that this is cough-equivalent asthma.  Consider Mecholyl.

## 2010-07-28 ENCOUNTER — Telehealth: Payer: Self-pay | Admitting: Internal Medicine

## 2010-07-28 MED ORDER — TIOTROPIUM BROMIDE MONOHYDRATE 18 MCG IN CAPS: 18.0000 ug | ORAL_CAPSULE | Freq: Every day | RESPIRATORY_TRACT | Status: DC

## 2010-07-28 NOTE — Op Note (Signed)
NAMEMESHIA, Jessica Woodard                ACCOUNT NO.:  [id number]   MEDICAL RECORD NO.:  [id number]          PATIENT TYPE:  AMB   LOCATION:  DSC                          FACILITY:  [facility]   PHYSICIAN:  Katy Fitch. Sypher, M.D. DATE OF BIRTH:  [birth year]   DATE OF PROCEDURE:  08/08/2008  DATE OF DISCHARGE:                               OPERATIVE REPORT   PREOPERATIVE DIAGNOSIS:  Chronic stenosing tenosynovitis right thumb at  A1 pulley.   POSTOPERATIVE DIAGNOSIS:  Chronic stenosing tenosynovitis right thumb at  A1 pulley.   OPERATION:  Release of right thumb A1 pulley.   SURGEON:  Katy Fitch. Sypher, MD   ASSISTANT:  Annye Rusk, PA-C   ANESTHESIA:  Lidocaine 1% metacarpal head level block right thumb  supplemented by IV sedation.   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 Blair Heys for evaluation and management  of a painful locking right thumb.  Clinical examination revealed  significant stenosing tenosynovitis at the A1 pulley.   Jessica Woodard was evaluated at our office.  We offered injection versus  release of the A1 pulley.  She was quite apprehensive about the concept  of injection therapy and ultimately due to her degree of apprehension I  recommended proceeding directly to release the A1 pulley under sedation.   After informed consent, she is brought to the operating room at this  time.   PROCEDURE:  Jessica Woodard is brought to the operating room and placed in  a supine position upon the operating table.   Following sedation the right arm was prepped with Betadine soap and  solution and sterilely draped.  Lidocaine 2% was infiltrated into the  path of the intended incision at metacarpal head level and into the  flexor sheath.   After few moments anesthesia satisfactory.   The right arm was then exsanguinated with Esmarch bandage and the  arterial  tourniquet inflated to 220 mmHg.  The procedure commenced with  a short transverse incision directly over the palpably thickened A1  pulley.  Subcutaneous tissues were carefully divided taking care to  gently retract the radial proper digital nerve.  The pulley was noted to  be opaque, thickened to a wall diameter of more than 2.5 mm.  This was  incised with scalpel and scissors.  The flexor tendon was exposed and  delivered.  The tendon had a large nodule of fibrous tissue infiltrating  into the fascicles of tendon collagen.   There was some fibrillation distal to the A1 pulley.   After release of the A1 pulley full passive motion of the IP joint was  recovered.   The wound was repaired with horizontal mattress sutures of 5-0 nylon.  Jessica Woodard wound was then dressed with Lyda Perone, sterile gauze and a  Coban dressing.   For aftercare, she was advised to begin immediate range of motion  excises.  We will see her back for followup the office in 7-8 days for  suture removal and advancement to an exercise program.  Katy Fitch Sypher, M.D.  Electronically Signed     RVS/MEDQ  D:  08/08/2008  T:  08/08/2008  Job:  [id number]   cc:   Bryan Lemma. Manus Gunning, M.D.

## 2010-07-28 NOTE — Telephone Encounter (Signed)
Spoke with patient-states she feels she is getting some relief from Spiriva and would like to continue using-per OV notes from [facility] on 07-02-10 okay to send Rx for Spiriva. Pt will use until seen at 08-2010 appt then decide to continue using or not. Rx sent and pt aware.

## 2010-08-12 ENCOUNTER — Telehealth: Payer: Self-pay | Admitting: Internal Medicine

## 2010-08-12 MED ORDER — TRAMADOL HCL 50 MG PO TABS: 50.0000 mg | ORAL_TABLET | Freq: Three times a day (TID) | ORAL | Status: DC

## 2010-08-12 NOTE — Telephone Encounter (Signed)
Per [facility]---ok to refill tramadol for pt. thanks

## 2010-08-12 NOTE — Telephone Encounter (Signed)
Called spoke with patient, advised [facility] okayed her refills on the tramadol.  Verified the pharmacy.    No refills have been given in epic previously, so will give the same quantity given in the past per EMR.  # 30 with 1 additional refill sent to verified pharmacy.

## 2010-08-12 NOTE — Telephone Encounter (Signed)
Spoke with patient and she states that she does not feel like Spiriva is helping and that she continues to cough. Pt is requesting a refill on tramadol. Pt has an appt on 09-08-10. Please advise if ok to refill tramadol. Carron Curie, CMA

## 2010-08-20 ENCOUNTER — Other Ambulatory Visit: Payer: Self-pay | Admitting: *Deleted

## 2010-08-20 MED ORDER — CICLESONIDE 80 MCG/ACT IN AERS: 2.0000 | INHALATION_SPRAY | Freq: Two times a day (BID) | RESPIRATORY_TRACT | Status: DC

## 2010-08-20 NOTE — Telephone Encounter (Signed)
LMOM informing pt rx sent to pharmacy.   

## 2010-08-20 NOTE — Telephone Encounter (Signed)
Refill request received for Alvesco 80 mcg inhaler. Pt's chart shows Qvar 80 mcg inhaler. Spoke with the patient and she says she has been using the Alvesco but I do not see this in the chart. Pls advise on which inhaler the patient should be using.

## 2010-08-20 NOTE — Telephone Encounter (Signed)
Per [facility]-okay to use Alvesco 80 2 puffs and rinse bid  With prn refills; if not available then use Qvar 80

## 2010-09-02 ENCOUNTER — Encounter (HOSPITAL_BASED_OUTPATIENT_CLINIC_OR_DEPARTMENT_OTHER)
Admission: RE | Admit: 2010-09-02 | Discharge: 2010-09-02 | Disposition: A | Discharge status: Home / Self Care | Payer: BC Managed Care – PPO | Source: Ambulatory Visit | Attending: Orthopedic Surgery | Admitting: Orthopedic Surgery

## 2010-09-02 LAB — BASIC METABOLIC PANEL
Chloride: 105 mEq/L (ref 96–112)
GFR calc Af Amer: >60 mL/min (ref >60)
Potassium: 4.3 mEq/L (ref 3.5–5.1)

## 2010-09-03 ENCOUNTER — Ambulatory Visit (HOSPITAL_BASED_OUTPATIENT_CLINIC_OR_DEPARTMENT_OTHER)
Admission: RE | Admit: 2010-09-03 | Discharge: 2010-09-03 | Disposition: A | Discharge status: Home / Self Care | Payer: BC Managed Care – PPO | Source: Ambulatory Visit | Attending: Orthopedic Surgery | Admitting: Orthopedic Surgery

## 2010-09-03 DIAGNOSIS — K219 Gastro-esophageal reflux disease without esophagitis: Secondary | ICD-10-CM | POA: Insufficient documentation

## 2010-09-03 DIAGNOSIS — Z01812 Encounter for preprocedural laboratory examination: Secondary | ICD-10-CM | POA: Insufficient documentation

## 2010-09-03 DIAGNOSIS — I10 Essential (primary) hypertension: Secondary | ICD-10-CM | POA: Insufficient documentation

## 2010-09-03 DIAGNOSIS — M65839 Other synovitis and tenosynovitis, unspecified forearm: Secondary | ICD-10-CM | POA: Insufficient documentation

## 2010-09-03 DIAGNOSIS — Z01810 Encounter for preprocedural cardiovascular examination: Secondary | ICD-10-CM | POA: Insufficient documentation

## 2010-09-03 DIAGNOSIS — J45909 Unspecified asthma, uncomplicated: Secondary | ICD-10-CM | POA: Insufficient documentation

## 2010-09-03 DIAGNOSIS — M249 Joint derangement, unspecified: Secondary | ICD-10-CM | POA: Insufficient documentation

## 2010-09-03 LAB — POCT HEMOGLOBIN-HEMACUE: Hemoglobin: 14 g/dL (ref 12.0–15.0)

## 2010-09-07 ENCOUNTER — Encounter: Payer: Self-pay | Admitting: Internal Medicine

## 2010-09-08 ENCOUNTER — Encounter: Payer: Self-pay | Admitting: Internal Medicine

## 2010-09-08 ENCOUNTER — Ambulatory Visit (INDEPENDENT_AMBULATORY_CARE_PROVIDER_SITE_OTHER): Payer: BC Managed Care – PPO | Admitting: Internal Medicine

## 2010-09-08 VITALS — BP 126/80 | HR 67 | Ht 62.0 in | Wt 138.6 lb

## 2010-09-08 DIAGNOSIS — J45909 Unspecified asthma, uncomplicated: Secondary | ICD-10-CM

## 2010-09-08 DIAGNOSIS — R059 Cough, unspecified: Secondary | ICD-10-CM

## 2010-09-08 DIAGNOSIS — R05 Cough: Secondary | ICD-10-CM

## 2010-09-08 DIAGNOSIS — D869 Sarcoidosis, unspecified: Secondary | ICD-10-CM

## 2010-09-08 NOTE — Assessment & Plan Note (Addendum)
Chronic mild bronchitis or asthmatic bronchitis. I am never impressed that her cough as seen while here, amounts to much, but it bothers her. PFT suggests a mild asthma with small airway reversibility, and she thinks Spiriva helps some.

## 2010-09-08 NOTE — Patient Instructions (Signed)
Sample Spiriva 1 daily  Continue Alvesco and Spiriva if they seem to be helping  Ok to continue tramadol for cough  Order- referral appointment [facility]  - dx Cough, Sarcoid, Asthma

## 2010-09-08 NOTE — Progress Notes (Signed)
Subjective:    Patient ID: Jessica Woodard, female    DOB: [birth year], 60 y.o.   MRN: [id number]  Cough Her past medical history is significant for asthma.  Asthma She complains of cough. Her past medical history is significant for asthma.   07/02/10- 49 yo never smoker with chronic cough best explained as asthma. Hx of occular sarcoid. Not shown to have GERD. Delsym and tramadol do help cough but still needs to take something. Tramadol once daily lasts all day. ANA 1:40, ACE 54 (8-52), EOS 7.6%. PFT FEB 6 had shown normal flows except reversible obstruction in small airways.  Uses Qvar 80 2 puffs twice daily and Proair about once per week.  Eyes were rechecked last month finding no active sarcoid there now.   09/08/10  60 yo never smoker with chronic cough best explained as asthma. Hx of occular sarcoid. Not shown to have GERD or systemic sarcoid. Had GOT for trigger finger surgery with no respiratory complication. Still has some cough. Spiriva is expensive but may help some. We can extend another sample.  Her insurance nurse called her and suggested referral to [facility] for another opinion about her cough. She wanted to wait on updating CXR and labs here, pending visit to [facility].  Review of Systems  Respiratory: Positive for cough.    See HPI Constitutional:   No- weight loss, night sweats, fevers, chills, fatigue, lassitude. HEENT: No-  Difficulty swallowing, tooth/dental problems, sore throat,                No -sneezing, itching, ear ache, nasal congestion, post nasal drip,   CV:  No- chest pain, orthopnea, PND, swelling in lower extremities, anasarca, dizziness, palpitations  GI  No- heartburn, indigestion, abdominal pain, nausea, vomiting, diarrhea, change in bowel habits, loss of appetite  Resp: No- shortness of breath with exertion or at rest.  No excess mucus,,  No coughing up of blood.  No change in color of mucus.  No wheezing.  Skin: no rash or lesions.  GU: no- dysuria, change  in color of urine, no urgency or frequency.  No flank pain.  MS:  No- joint pain or swelling.  No decreased range of motion.  No back pain.  Psych:  No- change in mood or affect. No depression or anxiety.  No memory loss.      Objective:   Physical Exam  General- Alert, Oriented, Affect-appropriate, Distress- none acute. No spontaneous cough while here  Skin- rash-none, lesions- none, excoriation- none  Lymphadenopathy- none  Head- atraumatic  Eyes- Gross vision intact, PERRLA, conjunctivae clear secretions  Ears- Hearing normal canals, Tm- normal  Nose- Clear,  No-Septal dev, mucus, polyps, erosion, perforation   Throat- Mallampati II , mucosa clear , drainage- none, tonsils- atrophic  Neck- flexible , trachea midline, no stridor , thyroid nl, carotid no bruit  Chest - symmetrical excursion , unlabored     Heart/CV- RRR , no murmur , no gallop  , no rub, nl s1 s2                     - JVD- none , edema- none, stasis changes- none, varices- none     Lung- clear to P&A-      Sounds seem coarser left base---, wheeze- none, cough- none , dullness-none, rub- none     Chest wall-   Abd- tender-no, distended-no, bowel sounds-present, HSM- no  Br/ Gen/ Rectal- Not done, not indicated  Extrem- cyanosis- none, clubbing,  none, atrophy- none, strength- nl   Right hand is bandaged after surgery  Neuro- grossly intact to observation        Assessment & Plan:

## 2010-09-08 NOTE — Op Note (Signed)
  NAMENYAISHA, Jessica Woodard                ACCOUNT NO.:  [id number]  MEDICAL RECORD NO.:  [id number]  LOCATION:                                 FACILITY:  PHYSICIAN:  Katy Fitch. Eloni Darius, M.D.      DATE OF BIRTH:  DATE OF PROCEDURE:  09/03/2010 DATE OF DISCHARGE:                              OPERATIVE REPORT   PREOPERATIVE DIAGNOSIS:  Locking, right long finger; stenosing tenosynovitis, A1 pulley.  POSTOPERATIVE DIAGNOSIS:  Locking, right long finger; stenosing tenosynovitis, A1 pulley.  OPERATION:  Release of right long finger A1 pulley.  OPERATIONS:  Katy Fitch. Marry Kusch, MD  ASSISTANT:  Marveen Reeks Dasnoit, PA-C  ANESTHESIA:  Lidocaine 2% supplemented by IV sedation.  SUPERVISING ANESTHESIOLOGIST:  Germaine Pomfret, MD  INDICATIONS:  Jessica Woodard is a 60 year old woman, self-referred for a locking right long finger.  I have treated Jessica Woodard in the past for stenosing tenosynovitis of her thumb and first dorsal compartment stenosing tenosynovitis.  We had attempted to treat her with injection in the past.  She is morbidly fearful of injections and actually, we had to abort an attempted prior injection due to her response.  She has had success release of her thumb A1 pulley and now presents requesting release of right long finger A1 pulley for chronic stenosing tenosynovitis symptoms.  PROCEDURE:  Jessica Woodard was interviewed by Dr. Jairo Ben of Anesthesia and advised to undergo monitored anesthesia care for release of her right long finger A1 pulley.  She is brought to room 1 of the [facility] and placed in supine position on the operating table.  IV sedation was provided.  Her allergy to SULFA was noted.  Under Dr. Edison Pace supervision, IV sedation was provided allowing Betadine prep of her long finger followed by infiltration of 2% lidocaine into the path of the intended incision. The flexor sheath was also infiltrated with 2% lidocaine  without epinephrine.  After few moments, excellent anesthesia was achieved.  The right arm was prepped with Betadine soap and solution, sterilely draped. A pneumatic tourniquet was applied at the proximal right brachium. Following exsanguination of right arm with Esmarch bandage, the arterial tourniquet was inflated to 220 mmHg.  A routine surgical time-out was accomplished followed by a short oblique incision in the distal palmar crease overlying the A1 pulley of the right long finger.  The tissues were carefully divided isolating the pulley.  The pulley was split with scalpel and scissors.  A small A0 pulley was noted proximally.  Tendons were delivered and found to be normal except for swelling.  The wound was then repaired with interrupted suture of 5-0 nylon.  A compressive dressing was applied with Xeroflo, sterile gauze, and Ace wrap.  There were no apparent complications.  For aftercare, Jessica Woodard is provided prescription for Vicodin 5 mg 1 p.o. q4-6 hours p.r.n. pain, 20 tablets without refill.     Katy Fitch Ajla Mcgeachy, M.D.     RVS/MEDQ  D:  09/03/2010  T:  09/04/2010  Job:  [id number]  Electronically Signed by Josephine Igo M.D. on 09/08/2010 09:27:23 AM

## 2010-09-08 NOTE — Assessment & Plan Note (Addendum)
She wanted to defer recheck of ACE and CXR pending evaluation at [facility].  We discussed possibility that slight ACE elevation and reduced DLCO might indicate subtle pulmonary parenchymal disease only detectable with CT.

## 2010-09-13 NOTE — Assessment & Plan Note (Signed)
She is almost never seen to cough much on office visits. We have not done barium swallow/ aspiration/ reflux eval in a long time, but unremarkable in past.

## 2010-10-09 ENCOUNTER — Other Ambulatory Visit: Payer: Self-pay | Admitting: Internal Medicine

## 2010-10-12 NOTE — Telephone Encounter (Signed)
Please advise if okay to refill. Thanks.  

## 2011-09-13 ENCOUNTER — Other Ambulatory Visit: Payer: Self-pay | Admitting: Internal Medicine

## 2011-09-15 NOTE — Telephone Encounter (Signed)
CY-please advise if okay to refill;not on medication list. Thanks.

## 2011-09-15 NOTE — Telephone Encounter (Signed)
Ok to refill Alvesco long term, # 1, 2 puffs and rinse mouth, twice daily

## 2013-10-25 ENCOUNTER — Ambulatory Visit: Payer: BC Managed Care – PPO | Admitting: *Deleted

## 2013-11-06 ENCOUNTER — Ambulatory Visit: Payer: BC Managed Care – PPO

## 2013-11-13 ENCOUNTER — Ambulatory Visit: Payer: BC Managed Care – PPO

## 2013-11-14 ENCOUNTER — Ambulatory Visit: Payer: BC Managed Care – PPO | Admitting: *Deleted

## 2013-11-20 ENCOUNTER — Ambulatory Visit: Payer: BC Managed Care – PPO

## 2015-07-21 ENCOUNTER — Institutional Professional Consult (permissible substitution): Payer: BC Managed Care – PPO | Admitting: Pulmonary Disease

## 2015-08-14 ENCOUNTER — Ambulatory Visit (INDEPENDENT_AMBULATORY_CARE_PROVIDER_SITE_OTHER): Payer: BC Managed Care – PPO | Admitting: Pulmonary Disease

## 2015-08-14 ENCOUNTER — Encounter: Payer: Self-pay | Admitting: Pulmonary Disease

## 2015-08-14 VITALS — BP 120/66 | HR 59 | Ht 62.5 in | Wt 138.0 lb

## 2015-08-14 DIAGNOSIS — K2100 Gastro-esophageal reflux disease with esophagitis, without bleeding: Secondary | ICD-10-CM

## 2015-08-14 DIAGNOSIS — J387 Other diseases of larynx: Secondary | ICD-10-CM

## 2015-08-14 DIAGNOSIS — D869 Sarcoidosis, unspecified: Secondary | ICD-10-CM | POA: Diagnosis not present

## 2015-08-14 DIAGNOSIS — J45991 Cough variant asthma: Secondary | ICD-10-CM

## 2015-08-14 DIAGNOSIS — K219 Gastro-esophageal reflux disease without esophagitis: Secondary | ICD-10-CM | POA: Insufficient documentation

## 2015-08-14 NOTE — Progress Notes (Signed)
Subjective:    Patient ID: Jessica Woodard, female    DOB: 06/18/1950, 65 y.o.   MRN: [id number]  Synopsis: Former patient of Dr. Baker Jessica Woodard at [facility] with cough variant asthma, GERD and irritable larynx syndrome. First seen by Jessica Woodard in June 2017.  HPI Chief Complaint  Patient presents with  . Advice Only    Self referral for asthma, seasonal allergies.  Former [facility] pt.     [name] [place] from [place] but recently followed with Dr. Graylon GoodMark Woodard for her asthma.  She says that she remembers having a frequent cough as a child, but she wasn't diagnosed until her 5020's.  She was finally diagnosed with asthma in her 20's and she was treated with immunotherapy for a while.  She has taken various inhaled medicines but it wasn't until she was taking Alvesco and Singulair that she finally got good control.    She had a flare up of her asthma in the last year, but she never needed prednisone.  She really hasn't neeed to take prednisone much over type.  However she has had severe episodes of bronchitis, but these have been infrequent on her current regimen.  She notes that her asthma is manifested primarily by breathing difficulty, inability to lay flat and breathing difficulty with strong smells.    Dr. Trevor Woodard's note says that she has cough variant asthma which is best controlled with ICS and GERD treatment.  She notes a a lot of cough after she sings in use her voice a lot. She also has frequent laryngitis frequently   Past Medical History  Diagnosis Date  . Allergic rhinitis, cause unspecified   . Sarcoid (HCC)   . Asthma      Family History  Problem Relation Age of Onset  . Asthma Mother   . Asthma Maternal Grandmother   . Asthma Maternal Grandfather   . Asthma Paternal Grandfather   . Asthma Paternal Grandmother   . Asthma Sister   . Asthma Brother      Social History   Social History  . Marital Status: Divorced    Spouse Name: N/A  . Number of Children: N/A  . Years of  Education: N/A   Occupational History  . Retired Runner, broadcasting/film/videoteacher    Social History Main Topics  . Smoking status: Never Smoker   . Smokeless tobacco: Never Used  . Alcohol Use: Not on file  . Drug Use: Not on file  . Sexual Activity: Not on file   Other Topics Concern  . Not on file   Social History Narrative     Allergies  Allergen Reactions  . Cefdinir     REACTION: diarrhea  . Sulfonamide Derivatives      Outpatient Prescriptions Prior to Visit  Medication Sig Dispense Refill  . aspirin 81 MG tablet Take 81 mg by mouth daily.      . metoprolol (TOPROL-XL) 50 MG 24 hr tablet Take 75 mg by mouth daily.     . valACYclovir (VALTREX) 500 MG tablet Take 1 tablet by mouth Daily.    Marland Kitchen. albuterol (PROAIR HFA) 108 (90 BASE) MCG/ACT inhaler Inhale 2 puffs into the lungs every 6 (six) hours as needed.      . Ascorbic Acid (VITAMIN C) 500 MG tablet Take 500 mg by mouth daily.      Marland Kitchen. azelastine (ASTELIN) 137 MCG/SPRAY nasal spray 1 spray by Nasal route 2 (two) times daily. Use in each nostril as directed     .  ciclesonide (ALVESCO) 80 MCG/ACT inhaler 2 puffs and rinse mouth, twice daily 1 Inhaler 11  . dextromethorphan (DELSYM) 30 MG/5ML liquid Take 60 mg by mouth as needed.      . fexofenadine (ALLEGRA) 180 MG tablet Take 180 mg by mouth daily.      . Glucosamine-Chondroit-Vit C-Mn (GLUCOSAMINE CHONDR 1500 COMPLX PO) Take 1 tablet by mouth daily.      Marland Kitchen HYDROcodone-acetaminophen (NORCO) 5-325 MG per tablet     . Multiple Vitamin (MULTIVITAMIN) capsule Take 1 capsule by mouth daily.      Marland Kitchen tiotropium (SPIRIVA HANDIHALER) 18 MCG inhalation capsule Place 1 capsule (18 mcg total) into inhaler and inhale daily. 30 capsule 0   No facility-administered medications prior to visit.       Review of Systems  Constitutional: Negative for fever and unexpected weight change.  HENT: Positive for congestion. Negative for dental problem, ear pain, nosebleeds, postnasal drip, rhinorrhea, sinus pressure,  sneezing, sore throat and trouble swallowing.   Eyes: Negative for redness and itching.  Respiratory: Positive for cough. Negative for chest tightness, shortness of breath and wheezing.   Cardiovascular: Negative for palpitations and leg swelling.  Gastrointestinal: Negative for nausea and vomiting.  Genitourinary: Negative for dysuria.  Musculoskeletal: Negative for joint swelling.  Skin: Negative for rash.  Neurological: Negative for headaches.  Hematological: Does not bruise/bleed easily.  Psychiatric/Behavioral: Negative for dysphoric mood. The patient is not nervous/anxious.        Objective:   Physical Exam Filed Vitals:   08/14/15 1447  BP: 120/66  Pulse: 59  Height: 5' 2.5" (1.588 m)  Weight: 138 lb (62.596 kg)  SpO2: 98%   RA  Gen: well appearing, no acute distress HENT: NCAT, OP clear, neck supple without masses Eyes: PERRL, EOMi Lymph: no cervical lymphadenopathy PULM: CTA B CV: RRR, no mgr, no JVD GI: BS+, soft, nontender, no hsm Derm: no rash or skin breakdown MSK: normal bulk and tone Neuro: A&Ox4, CN II-XII intact, strength 5/5 in all 4 extremities Psyche: normal mood and affect   Dr. Lorenso Woodard notes were reviewed where she was treated for cough. Asthma and gastroesophageal reflux disease  Chest x-ray from our institution in 2011 was normal  Pulmonary function testing from 2012 were reviewed, no airflow obstruction, normal volumes, mildly depressed DLCO not corrected for hemoglobin.     Assessment & Plan:  Irritable larynx It sounds as if the main problem here is irritable larynx syndrome. This is been flared up from gastroesophageal reflux disease and voice overuse over the years. From my review of Dr. Lorenso Woodard records it seems that controlling her acid reflux has been keep to controlling her symptoms. His may also be complicated by cough variant asthma.  Plan:  As this is been a stable interval there is no need to change anything She is encouraged to  continue taking her omeprazole and keeping the head of her bed elevated She is also encouraged not to eat within 30 minutes of bedtime I gave her instructions today on how to take care of her larynx when she gets a cough or cold encouraging voice rest, warm beverages etc.  Cough variant asthma She appears to have cough or an asthma but there is no evidence of airflow obstruction on her lung function testing and she reports few symptoms that are consistent with significant asthma. Her symptoms of cough have been well controlled with an inhaled corticosteroid as well as acid reflux as outlined above.  Plan: Continue Alvesco for now  When she becomes a Medicare patient we may change her to Qvar as was suggested by Dr. Lorenso Woodard on his last visit in 2016.  GERD (gastroesophageal reflux disease) As above  Sarcoidosis She has never had evidence of pulmonary sarcoid as I understand it from revieweing the records, she may have occular sarcoid     Current outpatient prescriptions:  .  albuterol (PROVENTIL HFA;VENTOLIN HFA) 108 (90 Base) MCG/ACT inhaler, Inhale 2 puffs into the lungs every 6 (six) hours as needed for wheezing or shortness of breath., Disp: , Rfl:  .  aspirin 81 MG tablet, Take 81 mg by mouth daily.  , Disp: , Rfl:  .  ciclesonide (ALVESCO) 160 MCG/ACT inhaler, Inhale 1 puff into the lungs 2 (two) times daily., Disp: , Rfl:  .  CINNAMON PO, Take 1,000 mg by mouth 2 (two) times daily., Disp: , Rfl:  .  Cyanocobalamin (B-12) 2500 MCG TABS, Take 1 tablet by mouth daily., Disp: , Rfl:  .  lansoprazole (PREVACID) 30 MG capsule, Take 30 mg by mouth 2 (two) times daily before a meal., Disp: , Rfl:  .  metoprolol (TOPROL-XL) 50 MG 24 hr tablet, Take 75 mg by mouth daily. , Disp: , Rfl:  .  montelukast (SINGULAIR) 10 MG tablet, Take 10 mg by mouth at bedtime., Disp: , Rfl:  .  Omega-3 Fatty Acids (FISH OIL) 1200 MG CAPS, Take 1 capsule by mouth daily., Disp: , Rfl:  .  ranitidine (ZANTAC) 150  MG capsule, Take 150 mg by mouth every evening., Disp: , Rfl:  .  valACYclovir (VALTREX) 500 MG tablet, Take 1 tablet by mouth Daily., Disp: , Rfl:

## 2015-08-14 NOTE — Assessment & Plan Note (Signed)
She has never had evidence of pulmonary sarcoid as I understand it from revieweing the records, she may have occular sarcoid

## 2015-08-14 NOTE — Assessment & Plan Note (Signed)
As above.

## 2015-08-14 NOTE — Patient Instructions (Signed)
Keep taking your medications as you're doing  When you have flare ups of your cough: You need to try to suppress your cough to allow your larynx (voice box) to heal.  For three days don't talk, laugh, sing, or clear your throat. Do everything you can to suppress the cough during this time. Use hard candies (sugarless Jolly Ranchers) or non-mint or non-menthol containing cough drops during this time to soothe your throat.  Use a cough suppressant (Delsym or what I have prescribed you) around the clock during this time.  After three days, gradually increase the use of your voice and back off on the cough suppressants.   We will see you back in 6 months or sooner if needed

## 2015-08-14 NOTE — Assessment & Plan Note (Signed)
She appears to have cough or an asthma but there is no evidence of airflow obstruction on her lung function testing and she reports few symptoms that are consistent with significant asthma. Her symptoms of cough have been well controlled with an inhaled corticosteroid as well as acid reflux as outlined above.  Plan: Continue Alvesco for now When she becomes a Medicare patient we may change her to Qvar as was suggested by Dr. Lorenso CourierPowers on his last visit in 2016.

## 2015-08-14 NOTE — Assessment & Plan Note (Signed)
It sounds as if the main problem here is irritable larynx syndrome. This is been flared up from gastroesophageal reflux disease and voice overuse over the years. From my review of Dr. Lorenso CourierPowers records it seems that controlling her acid reflux has been keep to controlling her symptoms. His may also be complicated by cough variant asthma.  Plan:  As this is been a stable interval there is no need to change anything She is encouraged to continue taking her omeprazole and keeping the head of her bed elevated She is also encouraged not to eat within 30 minutes of bedtime I gave her instructions today on how to take care of her larynx when she gets a cough or cold encouraging voice rest, warm beverages etc.

## 2016-06-23 ENCOUNTER — Encounter: Payer: Self-pay | Admitting: Gastroenterology

## 2016-08-17 ENCOUNTER — Encounter: Payer: Self-pay | Admitting: Gastroenterology

## 2016-08-17 ENCOUNTER — Ambulatory Visit (AMBULATORY_SURGERY_CENTER): Payer: Self-pay | Admitting: *Deleted

## 2016-08-17 VITALS — Ht 62.0 in | Wt 141.4 lb

## 2016-08-17 DIAGNOSIS — Z1211 Encounter for screening for malignant neoplasm of colon: Secondary | ICD-10-CM

## 2016-08-17 MED ORDER — NA SULFATE-K SULFATE-MG SULF 17.5-3.13-1.6 GM/177ML PO SOLN: ORAL | 0 refills | Status: DC

## 2016-08-31 ENCOUNTER — Ambulatory Visit (AMBULATORY_SURGERY_CENTER): Payer: Medicare Other | Admitting: Gastroenterology

## 2016-08-31 ENCOUNTER — Encounter: Payer: Self-pay | Admitting: Gastroenterology

## 2016-08-31 VITALS — BP 173/85 | HR 53 | Temp 97.8°F | Resp 10 | Ht 62.0 in | Wt 141.0 lb

## 2016-08-31 DIAGNOSIS — Z1212 Encounter for screening for malignant neoplasm of rectum: Secondary | ICD-10-CM | POA: Diagnosis not present

## 2016-08-31 DIAGNOSIS — Z1211 Encounter for screening for malignant neoplasm of colon: Secondary | ICD-10-CM

## 2016-08-31 MED ORDER — SODIUM CHLORIDE 0.9 % IV SOLN: 500.0000 mL | INTRAVENOUS | Status: DC

## 2016-08-31 NOTE — Patient Instructions (Signed)
YOU HAD AN ENDOSCOPIC PROCEDURE TODAY AT THE [facility]:   Refer to the procedure report that was given to you for any specific questions about what was found during the examination.  If the procedure report does not answer your questions, please call your gastroenterologist to clarify.  If you requested that your care partner not be given the details of your procedure findings, then the procedure report has been included in a sealed envelope for you to review at your convenience later.  YOU SHOULD EXPECT: Some feelings of bloating in the abdomen. Passage of more gas than usual.  Walking can help get rid of the air that was put into your GI tract during the procedure and reduce the bloating. If you had a lower endoscopy (such as a colonoscopy or flexible sigmoidoscopy) you may notice spotting of blood in your stool or on the toilet paper. If you underwent a bowel prep for your procedure, you may not have a normal bowel movement for a few days.  Please Note:  You might notice some irritation and congestion in your nose or some drainage.  This is from the oxygen used during your procedure.  There is no need for concern and it should clear up in a day or so.  SYMPTOMS TO REPORT IMMEDIATELY:   Following lower endoscopy (colonoscopy or flexible sigmoidoscopy):  Excessive amounts of blood in the stool  Significant tenderness or worsening of abdominal pains  Swelling of the abdomen that is new, acute  Fever of 100F or higher  For urgent or emergent issues, a gastroenterologist can be reached at any hour by calling [phone].   DIET:  We do recommend a small meal at first, but then you may proceed to your regular diet.  Drink plenty of fluids but you should avoid alcoholic beverages for 24 hours.  ACTIVITY:  You should plan to take it easy for the rest of today and you should NOT DRIVE or use heavy machinery until tomorrow (because of the sedation medicines used during the test).     FOLLOW UP: Our staff will call the number listed on your records the next business day following your procedure to check on you and address any questions or concerns that you may have regarding the information given to you following your procedure. If we do not reach you, we will leave a message.  However, if you are feeling well and you are not experiencing any problems, there is no need to return our call.  We will assume that you have returned to your regular daily activities without incident.  If any biopsies were taken you will be contacted by phone or by letter within the next 1-3 weeks.  Please call us at [phone] if you have not heard about the biopsies in 3 weeks.    SIGNATURES/CONFIDENTIALITY: You and/or your care partner have signed paperwork which will be entered into your electronic medical record.  These signatures attest to the fact that that the information above on your After Visit Summary has been reviewed and is understood.  Full responsibility of the confidentiality of this discharge information lies with you and/or your care-partner. 

## 2016-08-31 NOTE — Progress Notes (Signed)
Report to PACU, RN, vss, BBS= Clear.  

## 2016-08-31 NOTE — Op Note (Signed)
[facility] Patient Name: Jessica Woodard Procedure Date: 08/31/2016 9:48 AM MRN: [id number] Endoscopist: Sherilyn Cooter L. Myrtie Neither , MD Age: 66 Referring MD:  Date of Birth: 02-Sep-1950 Gender: Female Account #: [id number] Procedure:                Colonoscopy Indications:              Screening for colorectal malignant neoplasm (no                            polyps on 07/2006 colonoscopy) Medicines:                Monitored Anesthesia Care Procedure:                Pre-Anesthesia Assessment:                           - Prior to the procedure, a History and Physical                            was performed, and patient medications and                            allergies were reviewed. The patient's tolerance of                            previous anesthesia was also reviewed. The risks                            and benefits of the procedure and the sedation                            options and risks were discussed with the patient.                            All questions were answered, and informed consent                            was obtained. Anticoagulants: The patient has taken                            aspirin. It was decided not to withhold this                            medication prior to the procedure. ASA Grade                            Assessment: II - A patient with mild systemic                            disease. After reviewing the risks and benefits,                            the patient was deemed in satisfactory condition to  undergo the procedure.                           After obtaining informed consent, the colonoscope                            was passed under direct vision. Throughout the                            procedure, the patient's blood pressure, pulse, and                            oxygen saturations were monitored continuously. The                            Model PCF-H190DL [id number]) scope was introduced                             through the anus and advanced to the the cecum,                            identified by appendiceal orifice and ileocecal                            valve. The colonoscopy was performed without                            difficulty. The patient tolerated the procedure                            well. The quality of the bowel preparation was                            excellent. The ileocecal valve, appendiceal                            orifice, and rectum were photographed. The quality                            of the bowel preparation was evaluated using the                            BBPS [facility] Bowel Preparation Scale) with scores                            of: Right Colon = 3, Transverse Colon = 3 and Left                            Colon = 3 (entire mucosa seen well with no residual                            staining, small fragments of stool or opaque  liquid). The total BBPS score equals 9. The bowel                            preparation used was SUPREP. Scope In: 9:56:23 AM Scope Out: 10:07:47 AM Scope Withdrawal Time: 0 hours 8 minutes 37 seconds  Total Procedure Duration: 0 hours 11 minutes 24 seconds  Findings:                 The perianal and digital rectal examinations were                            normal.                           Retroflexion in the rectum was not performed due to                            anatomy.                           The entire examined colon appeared normal. Complications:            No immediate complications. Estimated Blood Loss:     Estimated blood loss: none. Impression:               - The entire examined colon is normal.                           - No specimens collected. Recommendation:           - Patient has a contact number available for                            emergencies. The signs and symptoms of potential                            delayed complications were discussed with the                             patient. Return to normal activities tomorrow.                            Written discharge instructions were provided to the                            patient.                           - Resume previous diet.                           - Continue present medications.                           - Repeat colonoscopy in 10 years for screening                            purposes. Henry L. Danis,  MD 08/31/2016 10:10:33 AM This report has been signed electronically.

## 2016-08-31 NOTE — Progress Notes (Signed)
Pt's states no medical or surgical changes since previsit or office visit. 

## 2016-09-01 ENCOUNTER — Telehealth: Payer: Self-pay | Admitting: *Deleted

## 2016-09-01 NOTE — Telephone Encounter (Signed)
  Follow up Call-  Call back number 08/31/2016  Post procedure Call Back phone  # [phone]  Permission to leave phone message Yes  Some recent data might be hidden     Patient questions:  Do you have a fever, pain , or abdominal swelling? No. Pain Score  0 *  Have you tolerated food without any problems? Yes.    Have you been able to return to your normal activities? Yes.    Do you have any questions about your discharge instructions: Diet   No. Medications  No. Follow up visit  No.  Do you have questions or concerns about your Care? No.  Actions: * If pain score is 4 or above: No action needed, pain <4. Patient stating she still feels sleepy and a little dizzy. Patient denies drinking plenty of fluids. Patient just woke up. Instructed patient to drink plenty of fluids and call if she needs us. Patient denies fever,abdominal pain or swelling.

## 2016-12-30 ENCOUNTER — Ambulatory Visit (INDEPENDENT_AMBULATORY_CARE_PROVIDER_SITE_OTHER): Payer: Medicare Other | Admitting: Pulmonary Disease

## 2016-12-30 ENCOUNTER — Encounter: Payer: Self-pay | Admitting: Pulmonary Disease

## 2016-12-30 ENCOUNTER — Ambulatory Visit (INDEPENDENT_AMBULATORY_CARE_PROVIDER_SITE_OTHER)
Admission: RE | Admit: 2016-12-30 | Discharge: 2016-12-30 | Disposition: A | Discharge status: Home / Self Care | Payer: Medicare Other | Source: Ambulatory Visit | Attending: Pulmonary Disease | Admitting: Pulmonary Disease

## 2016-12-30 VITALS — BP 126/74 | HR 67 | Ht 62.5 in | Wt 141.4 lb

## 2016-12-30 DIAGNOSIS — J387 Other diseases of larynx: Secondary | ICD-10-CM

## 2016-12-30 DIAGNOSIS — J45991 Cough variant asthma: Secondary | ICD-10-CM | POA: Diagnosis not present

## 2016-12-30 DIAGNOSIS — R05 Cough: Secondary | ICD-10-CM

## 2016-12-30 DIAGNOSIS — R059 Cough, unspecified: Secondary | ICD-10-CM

## 2016-12-30 LAB — NITRIC OXIDE: NITRIC OXIDE: 50

## 2016-12-30 IMAGING — DX DG CHEST 2V
2 series · 2 of 2 positions shown · non-contrast
Comparison: [DATE]

CLINICAL DATA: Chronic cough, history of sarcoidosis

EXAM:
CHEST  2 VIEW

[chest pa]
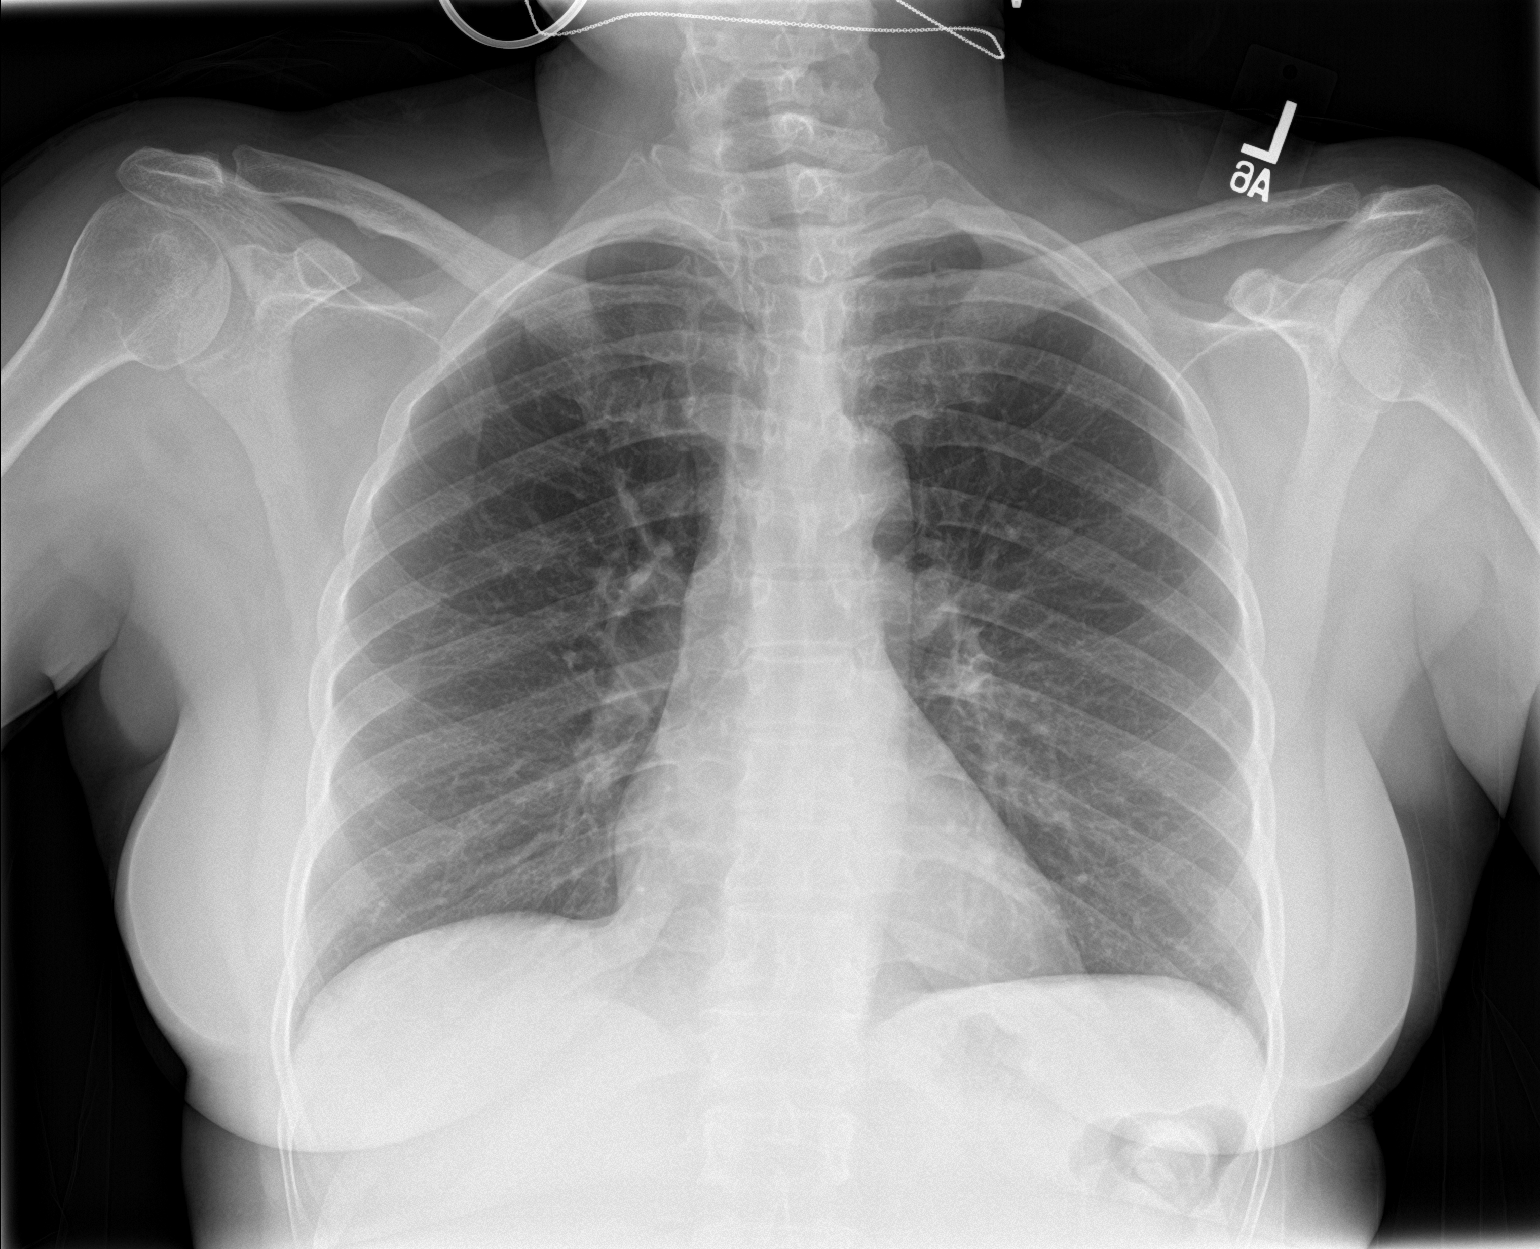

[chest lat]
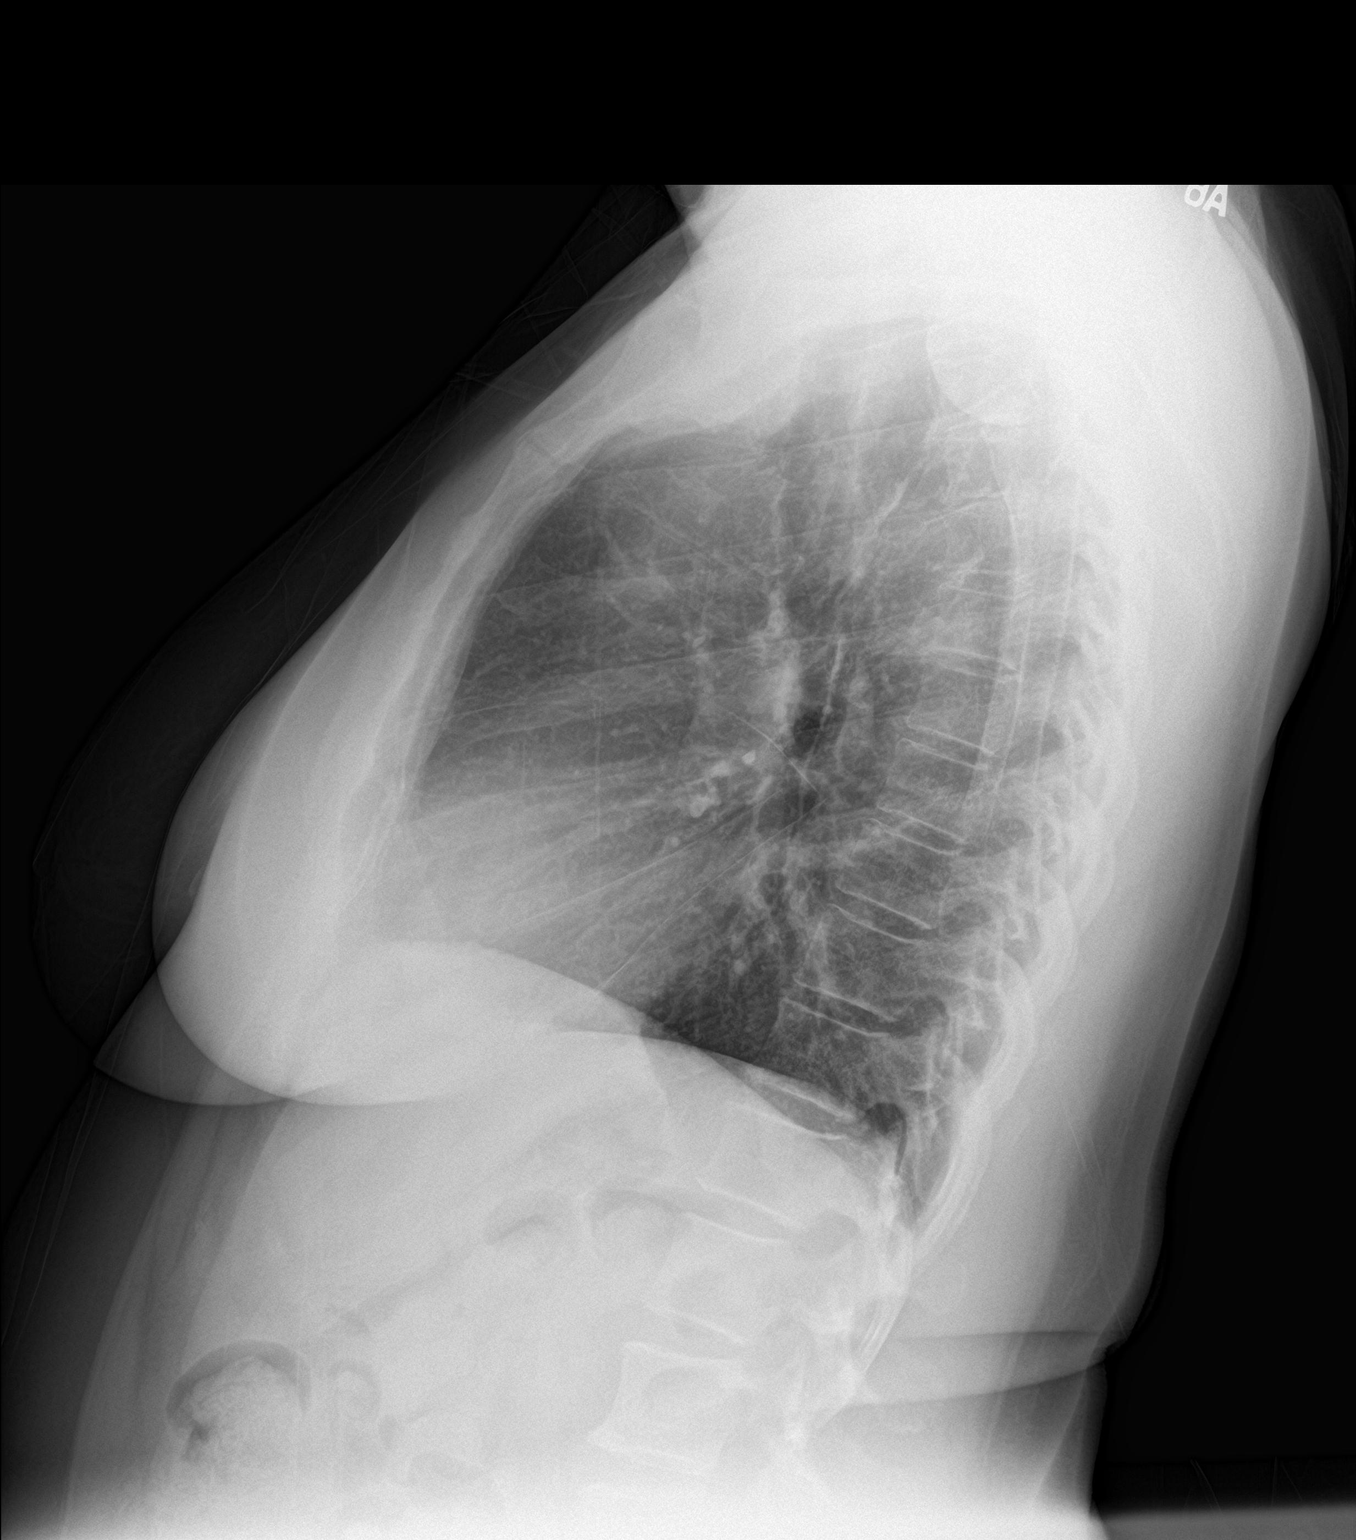

[2 of 2 positions shown; findings below may reference images not displayed]

FINDINGS: The heart size and mediastinal contours are within normal limits.
Both lungs are clear. The visualized skeletal structures are
unremarkable.
IMPRESSION: No active cardiopulmonary disease.

## 2016-12-30 MED ORDER — BECLOMETHASONE DIPROP HFA 80 MCG/ACT IN AERB: 2.0000 | INHALATION_SPRAY | Freq: Two times a day (BID) | RESPIRATORY_TRACT | 11 refills | Status: DC

## 2016-12-30 NOTE — Patient Instructions (Signed)
Cough variant asthma: Today's exhaled nitric oxide test was elevated so that tells me there is inflammation in your lungs. I would like for you to take Qvar 3 puffs twice a day for the next few months. Let me know if this is ineffective. Use albuterol as needed for chest tightness or wheezing  Upper airway cough syndrome: I think this is a problem from gastroesophageal reflux disease and allergic rhinitis Keep taking your antacid treatment as you're doing Don't eat within 3 hours of bedtime Use Neil Med rinses with distilled water at least twice per day using the instructions on the package. 1/2 hour after [facility], use Nasacort two puffs in each nostril once per day.  Remember that the Nasacort can take 1-2 weeks to work after regular use. Use generic zyrtec (cetirizine) every day.  If this doesn't help, then stop taking it and use chlorpheniramine-phenylephrine combination tablets. If this has not improved then we will consider elavil next visit Chest X-ray today  We will see you back in 3 months or sooner if needed

## 2016-12-30 NOTE — Progress Notes (Signed)
Subjective:    Patient ID: Jessica IdeHelen Y Woodard, female    DOB: 01/01/1951, 66 y.o.   MRN: [id number]  Synopsis: Former patient of [facility] Baker [facility] with cough variant asthma, GERD and irritable larynx syndrome. [facility]  HPI Chief Complaint  Patient presents with  . Follow-up    last seen 08/2015; c/o stable cough.  having difficulty affording alvesco.     [name] says she has been doing OK.  She says that she coughs a lot.  She needs a refill on her rescue inhaler.  She still takes Air traffic controlleralvesco regulalry.  She says she hcan feel a rattling in her chest from time to time.  She stays active and is cutting her grass a lot and feels short of breath some when she does that. She cut back on her alvesco in the summer some.  She says that the cough is really driving her nuts though.  She has more cough when she sings in the choir and it is typically worse when she is in a quite place like church.  She has some post nasal drip right now.  She is compliant with her GERD treatment (PPI and nightly famotididine).  Alvesco is too expensive.   Past Medical History:  Diagnosis Date  . Allergic rhinitis, cause unspecified   . Allergy    seasonal   . Anxiety    sleep walking/night terrors  . Asthma    inhalers  . GERD (gastroesophageal reflux disease)   . Hypertension   . Sarcoid           Review of Systems  Constitutional: Negative for fever and unexpected weight change.  HENT: Positive for congestion. Negative for dental problem, ear pain, nosebleeds, postnasal drip, rhinorrhea, sinus pressure, sneezing, sore throat and trouble swallowing.   Eyes: Negative for redness and itching.  Respiratory: Positive for cough. Negative for chest tightness, shortness of breath and wheezing.   Cardiovascular: Negative for palpitations and leg swelling.  Gastrointestinal: Negative for nausea and vomiting.  Genitourinary: Negative for dysuria.  Musculoskeletal: Negative  for joint swelling.  Skin: Negative for rash.  Neurological: Negative for headaches.  Hematological: Does not bruise/bleed easily.  Psychiatric/Behavioral: Negative for dysphoric mood. The patient is not nervous/anxious.        Objective:   Physical Exam Vitals:   12/30/16 1500  BP: 126/74  Pulse: 67  SpO2: 98%  Weight: 141 lb 6.4 oz (64.1 kg)  Height: 5' 2.5" (1.588 m)   RA  Gen: well appearing HENT: OP clear, TM's clear, neck supple PULM: CTA B, normal percussion CV: RRR, no mgr, trace edema GI: BS+, soft, nontender Derm: no cyanosis or rash Psyche: normal mood and affect    Dr. Lorenso CourierPowers notes were reviewed where she was treated for cough. Asthma and gastroesophageal reflux disease  Chest x-ray from our institution in 2011 was normal  Pulmonary function testing from 2012 were reviewed, no airflow obstruction, normal volumes, mildly depressed DLCO not corrected for hemoglobin.  CBC    Component Value Date/Time   WBC 6.5 05/14/2010 0934   RBC 4.31 05/14/2010 0934   HGB 14.0 09/03/2010 1253   HCT 39.8 05/14/2010 0934   PLT 213.0 05/14/2010 0934   MCV 92.3 05/14/2010 0934   MCHC 34.5 05/14/2010 0934   RDW 12.7 05/14/2010 0934   LYMPHSABS 2.7 05/14/2010 0934   MONOABS 0.5 05/14/2010 0934   EOSABS 0.5 05/14/2010 0934   BASOSABS 0.0 05/14/2010 0934  Exhaled nitric oxide test: 12/2016 50ppm     Assessment & Plan:    Cough - Plan: DG Chest 2 View, Nitric oxide  Irritable larynx  Cough variant asthma  Discussion: Her cough has been a little worse which probably is due to the fact that she has not been using his much of the inhaled corticosteroid lately due to cost. Today her exhaled nitric oxide test was elevated which corresponds to increased eosinophilic inflammation of the airways. So think her cough. Asthma is contributing to her cough to some degree though she clearly continues to have upper airway cough syndrome. She currently has some allergic rhinitis  which is poorly controlled.  Plan: Cough variant asthma: Today's exhaled nitric oxide test was elevated so that tells me there is inflammation in your lungs. I would like for you to take Qvar 3 puffs twice a day for the next few months. Let me know if this is ineffective. Use albuterol as needed for chest tightness or wheezing  Upper airway cough syndrome: I think this is a problem from gastroesophageal reflux disease and allergic rhinitis Keep taking your antacid treatment as you're doing Don't eat within 3 hours of bedtime Use Neil Med rinses with distilled water at least twice per day using the instructions on the package. 1/2 hour after using the [facility] Med rinse, use Nasacort two puffs in each nostril once per day.  Remember that the Nasacort can take 1-2 weeks to work after regular use. Use generic zyrtec (cetirizine) every day.  If this doesn't help, then stop taking it and use chlorpheniramine-phenylephrine combination tablets. If this has not improved then we will consider elavil next visit Chest X-ray today  We will see you back in 3 months or sooner if needed     Current Outpatient Prescriptions:  .  albuterol (PROVENTIL HFA;VENTOLIN HFA) 108 (90 Base) MCG/ACT inhaler, Inhale 2 puffs into the lungs every 6 (six) hours as needed for wheezing or shortness of breath., Disp: , Rfl:  .  aspirin 81 MG tablet, Take 81 mg by mouth daily.  , Disp: , Rfl:  .  ciclesonide (ALVESCO) 160 MCG/ACT inhaler, Inhale 1 puff into the lungs 2 (two) times daily., Disp: , Rfl:  .  CINNAMON PO, Take 1,000 mg by mouth 2 (two) times daily., Disp: , Rfl:  .  Cyanocobalamin (B-12) 2500 MCG TABS, Take 1 tablet by mouth daily., Disp: , Rfl:  .  lansoprazole (PREVACID) 30 MG capsule, Take 30 mg by mouth 2 (two) times daily before a meal., Disp: , Rfl:  .  metoprolol (TOPROL-XL) 50 MG 24 hr tablet, Take 75 mg by mouth daily. , Disp: , Rfl:  .  montelukast (SINGULAIR) 10 MG tablet, Take 10 mg by mouth at  bedtime., Disp: , Rfl:  .  Omega-3 Fatty Acids (FISH OIL) 1200 MG CAPS, Take 1 capsule by mouth daily., Disp: , Rfl:  .  ranitidine (ZANTAC) 150 MG capsule, Take 150 mg by mouth every evening., Disp: , Rfl:  .  valACYclovir (VALTREX) 500 MG tablet, Take 1 tablet by mouth Daily., Disp: , Rfl:   Current Facility-Administered Medications:  .  0.9 %  sodium chloride infusion, 500 mL, Intravenous, Continuous, Danis, Andreas Blower, MD

## 2017-01-04 ENCOUNTER — Telehealth: Payer: Self-pay | Admitting: Pulmonary Disease

## 2017-01-04 ENCOUNTER — Telehealth: Payer: Self-pay

## 2017-01-04 NOTE — Telephone Encounter (Signed)
Notes recorded by Lupita LeashMcQuaid, Douglas B, MD on 12/30/2016 at 5:26 PM EDT A, Please let the patient know this was OK Thanks, B  Advised pt of results.

## 2017-01-04 NOTE — Telephone Encounter (Signed)
PA started on covermymeds but I got to this questions and do not see any history of trail and failure to these inhalers.   The following alternatives are the preferred alternatives: Arnuity Ellipta, Flovent HFA or Flovent Diskus. Would you like to switch to the provided preferred alternatives?    BQ did you want to switch patient to one of these? Please advise.

## 2017-01-05 ENCOUNTER — Ambulatory Visit (INDEPENDENT_AMBULATORY_CARE_PROVIDER_SITE_OTHER): Payer: Medicare Other

## 2017-01-05 ENCOUNTER — Ambulatory Visit (INDEPENDENT_AMBULATORY_CARE_PROVIDER_SITE_OTHER): Payer: Medicare Other | Admitting: Orthopaedic Surgery

## 2017-01-05 ENCOUNTER — Encounter (INDEPENDENT_AMBULATORY_CARE_PROVIDER_SITE_OTHER): Payer: Self-pay | Admitting: Orthopaedic Surgery

## 2017-01-05 DIAGNOSIS — M79604 Pain in right leg: Secondary | ICD-10-CM | POA: Insufficient documentation

## 2017-01-05 MED ORDER — PREDNISONE 10 MG (21) PO TBPK: ORAL_TABLET | ORAL | 0 refills | Status: DC

## 2017-01-05 MED ORDER — TIZANIDINE HCL 4 MG PO TABS: 4.0000 mg | ORAL_TABLET | Freq: Four times a day (QID) | ORAL | 2 refills | Status: DC | PRN

## 2017-01-05 NOTE — Telephone Encounter (Signed)
lmtcb X1 for pt.   Will send in rx after speaking to pt.

## 2017-01-05 NOTE — Progress Notes (Signed)
Office Visit Note   Patient: Jessica Woodard           Date of Birth: 09/23/1950           MRN: [id number] Visit Date: 01/05/2017              Requested by: Blair HeysEhinger, Robert, MD [address] [place], [place] [address] PCP: Blair HeysEhinger, Robert, MD   Assessment & Plan: Visit Diagnoses:  1. Right leg pain     Plan: Impression is right lower leg pain.  Patient's presentation is somewhat unclear.  I think I will like to start with prednisone taper and a muscle relaxer to see if this will give her some relief.  If not she will return and we could consider imaging of her back.  Questions encouraged and answered.  Follow-Up Instructions: Return if symptoms worsen or fail to improve.   Orders:  Orders Placed This Encounter  Procedures  . XR KNEE 3 VIEW RIGHT   Meds ordered this encounter  Medications  . predniSONE (STERAPRED UNI-PAK 21 TAB) 10 MG (21) TBPK tablet    Sig: Take as directed    Dispense:  21 tablet    Refill:  0  . tiZANidine (ZANAFLEX) 4 MG tablet    Sig: Take 1 tablet (4 mg total) by mouth every 6 (six) hours as needed for muscle spasms.    Dispense:  30 tablet    Refill:  2      Procedures: No procedures performed   Clinical Data: No additional findings.   Subjective: Chief Complaint  Patient presents with  . Right Knee - Pain    Patient is a 66 year old female who comes in with right lateral lower leg pain for the past month.  The pain radiates from essentially the left follow-up of the fibular head to the level of the lateral malleolus.  She denies any injuries.  It feels like pins and needles at night.  She denies any numbness and tingling or back pain.  She denies any true knee pain or hip pain.    Review of Systems  Constitutional: Negative.   HENT: Negative.   Eyes: Negative.   Respiratory: Negative.   Cardiovascular: Negative.   Endocrine: Negative.   Musculoskeletal: Negative.   Neurological: Negative.   Hematological: Negative.    Psychiatric/Behavioral: Negative.   All other systems reviewed and are negative.    Objective: Vital Signs: There were no vitals taken for this visit.  Physical Exam  Constitutional: She is oriented to person, place, and time. She appears well-developed and well-nourished.  HENT:  Head: Normocephalic and atraumatic.  Eyes: EOM are normal.  Neck: Neck supple.  Pulmonary/Chest: Effort normal.  Abdominal: Soft.  Neurological: She is alert and oriented to person, place, and time.  Skin: Skin is warm. Capillary refill takes less than 2 seconds.  Psychiatric: She has a normal mood and affect. Her behavior is normal. Judgment and thought content normal.  Nursing note and vitals reviewed.   Ortho Exam Right lower extremity exam is essentially benign.  She has full range of motion of her knee and hip without pain.  Negative straight leg raise test.  Negative Tinel's at the fibular head.  There is no pain with resisted flexion of the peroneals.  She has no back pain. Specialty Comments:  No specialty comments available.  Imaging: Xr Knee 3 View Right  Result Date: 01/05/2017 No acute bony or structural abnormalities    PMFS History: Patient  Active Problem List   Diagnosis Date Noted  . Right leg pain 01/05/2017  . Irritable larynx 08/14/2015  . Cough variant asthma 08/14/2015  . GERD (gastroesophageal reflux disease) 08/14/2015  . Sarcoidosis 05/14/2010  . COUGH 10/10/2009  . ASTHMA 07/18/2009  . ALLERGIC RHINITIS 05/05/2009  . BRONCHITIS, ACUTE 04/29/2009  . RHINITIS 04/29/2009   Past Medical History:  Diagnosis Date  . Allergic rhinitis, cause unspecified   . Allergy    seasonal   . Anxiety    sleep walking/night terrors  . Asthma    inhalers  . GERD (gastroesophageal reflux disease)   . Hypertension   . Sarcoid     Family History  Problem Relation Age of Onset  . Asthma Mother   . Colon polyps Mother   . Diabetes Mother   . Heart disease Mother   .  Stroke Mother   . Kidney disease Mother   . Asthma Maternal Grandmother   . Asthma Maternal Grandfather   . Asthma Paternal Grandfather   . Asthma Paternal Grandmother   . Asthma Sister   . Breast cancer Sister   . Diabetes Sister   . Asthma Brother   . Colon polyps Brother   . Heart disease Brother   . Colon cancer Neg Hx     Past Surgical History:  Procedure Laterality Date  . DEBRIDEMENT TOE     corns removed  . EXTERNAL FIXATION OF FINGER    . TONSILLECTOMY     Social History   Occupational History  . Retired Runner, broadcasting/film/video    Social History Main Topics  . Smoking status: Never Smoker  . Smokeless tobacco: Never Used  . Alcohol use No  . Drug use: No  . Sexual activity: Not on file

## 2017-01-05 NOTE — Telephone Encounter (Signed)
arnuity 100 daily

## 2017-01-13 MED ORDER — FLUTICASONE FUROATE 100 MCG/ACT IN AEPB: 1.0000 | INHALATION_SPRAY | Freq: Every day | RESPIRATORY_TRACT | 5 refills | Status: DC

## 2017-01-13 NOTE — Telephone Encounter (Signed)
Pt returned phone call, pt contact # [phone]

## 2017-01-13 NOTE — Telephone Encounter (Signed)
Called spoke with patient and discussed the change in her medications.  Pt okay with this and voiced her understanding.  Arnuity sent to verified pharmacy.  Pt is aware to contact the office for worsening or unimproved symptoms on this new medication.  We have no samples to offer patient and provide demo.  She was recommended to ask her pharmacist for demo when she picks up her Rx.  Nothing further needed; will sign off

## 2017-01-20 ENCOUNTER — Telehealth: Payer: Self-pay | Admitting: Pulmonary Disease

## 2017-01-20 MED ORDER — FLUTICASONE FUROATE 100 MCG/ACT IN AEPB: 1.0000 | INHALATION_SPRAY | Freq: Every day | RESPIRATORY_TRACT | 5 refills | Status: DC

## 2017-01-20 NOTE — Telephone Encounter (Signed)
Rx sent sent to [facility] [address]. [facility] [address]. I called pt to let her know and left a detailed message on her VM.

## 2017-01-21 NOTE — Telephone Encounter (Signed)
error 

## 2017-03-28 ENCOUNTER — Encounter: Payer: Self-pay | Admitting: Pulmonary Disease

## 2017-03-28 ENCOUNTER — Ambulatory Visit: Payer: Medicare Other | Admitting: Pulmonary Disease

## 2017-03-28 VITALS — BP 128/80 | HR 58 | Ht 62.5 in | Wt 140.0 lb

## 2017-03-28 DIAGNOSIS — R05 Cough: Secondary | ICD-10-CM

## 2017-03-28 DIAGNOSIS — J387 Other diseases of larynx: Secondary | ICD-10-CM

## 2017-03-28 DIAGNOSIS — J45991 Cough variant asthma: Secondary | ICD-10-CM

## 2017-03-28 DIAGNOSIS — R059 Cough, unspecified: Secondary | ICD-10-CM

## 2017-03-28 DIAGNOSIS — K2100 Gastro-esophageal reflux disease with esophagitis, without bleeding: Secondary | ICD-10-CM

## 2017-03-28 MED ORDER — FLUTICASONE FUROATE 100 MCG/ACT IN AEPB: 1.0000 | INHALATION_SPRAY | Freq: Every day | RESPIRATORY_TRACT | 11 refills | Status: DC

## 2017-03-28 NOTE — Patient Instructions (Addendum)
For cough variant asthma: Continue Arnuity as you are doing Continue rinsing her mouth after using the inhaled steroid Practice good hand hygiene this time of year  For allergic rhinitis/postnasal drip: Use Neil Med rinses with distilled water at least twice per day using the instructions on the package. 1/2 hour after using the [facility] rinse, use Nasacort two puffs in each nostril once per day.  Remember that the Nasacort can take 1-2 weeks to work after regular use. Use generic zyrtec (cetirizine) every day.  If this doesn't help, then stop taking it and use chlorpheniramine-phenylephrine combination tablets.  GERD: Continue taking the prevacid as you are doing  We will see you back in 1 year or sooner if needed

## 2017-03-28 NOTE — Progress Notes (Signed)
Subjective:    Patient ID: Jessica Woodard, female    DOB: 11/14/1950, 67 y.o.   MRN: [id number]  Synopsis: Former patient of [facility] with cough variant asthma, GERD and irritable larynx syndrome. [facility]  HPI Chief Complaint  Patient presents with  . Follow-up    pt doing well on arnuity, still notes a mostly nonprod cough with some occ clear mucus.     Jessica Woodard started taking Arnuity and it has helped a little.  Her cough has improved on it.  She is not coughin as much as before. No dyspnea.    GERD: still has some, but better controlled now  Post nasal drip: has some, but not too bad.    Past Medical History:  Diagnosis Date  . Allergic rhinitis, cause unspecified   . Allergy    seasonal   . Anxiety    sleep walking/night terrors  . Asthma    inhalers  . GERD (gastroesophageal reflux disease)   . Hypertension   . Sarcoid           Review of Systems  Constitutional: Negative for fever and unexpected weight change.  HENT: Positive for congestion. Negative for dental problem, ear pain, nosebleeds, postnasal drip, rhinorrhea, sinus pressure, sneezing, sore throat and trouble swallowing.   Eyes: Negative for redness and itching.  Respiratory: Positive for cough. Negative for chest tightness, shortness of breath and wheezing.   Cardiovascular: Negative for palpitations and leg swelling.  Gastrointestinal: Negative for nausea and vomiting.  Genitourinary: Negative for dysuria.  Musculoskeletal: Negative for joint swelling.  Skin: Negative for rash.  Neurological: Negative for headaches.  Hematological: Does not bruise/bleed easily.  Psychiatric/Behavioral: Negative for dysphoric mood. The patient is not nervous/anxious.        Objective:   Physical Exam Vitals:   03/28/17 0854  BP: 128/80  Pulse: (!) 58  SpO2: 98%  Weight: 140 lb (63.5 kg)  Height: 5' 2.5" (1.588 m)   RA  Gen: well appearing HENT: OP  clear, TM's clear, neck supple PULM: CTA B, normal percussion CV: RRR, no mgr, trace edema GI: BS+, soft, nontender Derm: no cyanosis or rash Psyche: normal mood and affect    Imaging: Chest x-ray from our institution in 2011 was normal  PFT: Pulmonary function testing from 2012 were reviewed, no airflow obstruction, normal volumes, mildly depressed DLCO not corrected for hemoglobin.  CBC    Component Value Date/Time   WBC 6.5 05/14/2010 0934   RBC 4.31 05/14/2010 0934   HGB 14.0 09/03/2010 1253   HCT 39.8 05/14/2010 0934   PLT 213.0 05/14/2010 0934   MCV 92.3 05/14/2010 0934   MCHC 34.5 05/14/2010 0934   RDW 12.7 05/14/2010 0934   LYMPHSABS 2.7 05/14/2010 0934   MONOABS 0.5 05/14/2010 0934   EOSABS 0.5 05/14/2010 0934   BASOSABS 0.0 05/14/2010 0934   Exhaled nitric oxide test: 12/2016 50ppm     Assessment & Plan:    Cough  Irritable larynx  Cough variant asthma  Gastroesophageal reflux disease with esophagitis  Discussion: [facility]  She has cough variant asthma which is well controlled with an inhaled corticosteroid.  This is exacerbated by gastroesophageal reflux disease as well as occasional allergic rhinitis.  We discussed treatment for all of the above today.  Her flu shot is up-to-date.  For cough variant asthma: Continue Arnuity as you are doing Continue rinsing her  mouth after using the inhaled steroid Practice good hand hygiene this time of year  For allergic rhinitis/postnasal drip: Use [facility] Med rinses with distilled water at least twice per day using the instructions on the package. 1/2 hour after using the [facility] Med rinse, use Nasacort two puffs in each nostril once per day.  Remember that the Nasacort can take 1-2 weeks to work after regular use. Use generic zyrtec (cetirizine) every day.  If this doesn't help, then stop taking it and use chlorpheniramine-phenylephrine combination tablets.  GERD: Continue taking  the prevacid as you are doing  We will see you back in 1 year or sooner if needed     Current Outpatient Medications:  .  albuterol (PROVENTIL HFA;VENTOLIN HFA) 108 (90 Base) MCG/ACT inhaler, Inhale 2 puffs into the lungs every 6 (six) hours as needed for wheezing or shortness of breath., Disp: , Rfl:  .  aspirin 81 MG tablet, Take 81 mg by mouth daily.  , Disp: , Rfl:  .  CINNAMON PO, Take 1,000 mg by mouth 2 (two) times daily., Disp: , Rfl:  .  Cyanocobalamin (B-12) 2500 MCG TABS, Take 1 tablet by mouth daily., Disp: , Rfl:  .  Fluticasone Furoate (ARNUITY ELLIPTA) 100 MCG/ACT AEPB, Inhale 1 puff daily into the lungs., Disp: 30 each, Rfl: 5 .  lansoprazole (PREVACID) 30 MG capsule, Take 30 mg by mouth 2 (two) times daily before a meal., Disp: , Rfl:  .  metoprolol (TOPROL-XL) 50 MG 24 hr tablet, Take 75 mg by mouth daily. , Disp: , Rfl:  .  montelukast (SINGULAIR) 10 MG tablet, Take 10 mg by mouth at bedtime., Disp: , Rfl:  .  Omega-3 Fatty Acids (FISH OIL) 1200 MG CAPS, Take 1 capsule by mouth daily., Disp: , Rfl:  .  valACYclovir (VALTREX) 500 MG tablet, Take 1 tablet by mouth Daily., Disp: , Rfl:   Current Facility-Administered Medications:  .  0.9 %  sodium chloride infusion, 500 mL, Intravenous, Continuous, Danis, Andreas Blower, MD

## 2018-05-16 ENCOUNTER — Ambulatory Visit: Payer: Medicare Other | Admitting: Pulmonary Disease

## 2018-05-16 ENCOUNTER — Encounter: Payer: Self-pay | Admitting: Pulmonary Disease

## 2018-05-16 VITALS — BP 114/72 | HR 60 | Ht 62.5 in | Wt 139.2 lb

## 2018-05-16 DIAGNOSIS — K2100 Gastro-esophageal reflux disease with esophagitis, without bleeding: Secondary | ICD-10-CM

## 2018-05-16 DIAGNOSIS — J387 Other diseases of larynx: Secondary | ICD-10-CM | POA: Diagnosis not present

## 2018-05-16 DIAGNOSIS — J45991 Cough variant asthma: Secondary | ICD-10-CM | POA: Diagnosis not present

## 2018-05-16 NOTE — Progress Notes (Signed)
Subjective:    Patient ID: Jessica Woodard, female    DOB: October 08, 1950, 68 y.o.   MRN: [id number]  Synopsis: Former patient of Dr. Baker [facility] at [facility] with cough variant asthma, GERD and irritable larynx syndrome. First seen by [facility] in June 2017.  HPI Chief Complaint  Patient presents with  . Follow-up    1 yr f/u for cough. States her breathing has been since her last visit. Has been dealing with a sinus infection and vertigo for the past few weeks.     Jessica Woodard has been struggling with sinusitis and has been taking amoxicillin.  She had diarrhea which is now better after she has been eating diarrhea.   She says taht if she "over exerts" she will get short of breath, like yard work, walking too far.  Her mother is struggling with pulmonary fibrosis.   SHe has not had flare ups of her asthma: no prednisone.  Sh ehas been compliant with her Arnuity.  She sometimes feels like it causes her to have mucus build up in her nose.   Past Medical History:  Diagnosis Date  . Allergic rhinitis, cause unspecified   . Allergy    seasonal   . Anxiety    sleep walking/night terrors  . Asthma    inhalers  . GERD (gastroesophageal reflux disease)   . Hypertension   . Sarcoid           Review of Systems  Constitutional: Negative for fever and unexpected weight change.  HENT: Positive for congestion. Negative for dental problem, ear pain, nosebleeds, postnasal drip, rhinorrhea, sinus pressure, sneezing, sore throat and trouble swallowing.   Eyes: Negative for redness and itching.  Respiratory: Positive for cough. Negative for chest tightness, shortness of breath and wheezing.   Cardiovascular: Negative for palpitations and leg swelling.  Gastrointestinal: Negative for nausea and vomiting.  Genitourinary: Negative for dysuria.  Musculoskeletal: Negative for joint swelling.  Skin: Negative for rash.  Neurological: Negative for headaches.  Hematological: Does not bruise/bleed easily.   Psychiatric/Behavioral: Negative for dysphoric mood. The patient is not nervous/anxious.        Objective:   Physical Exam Vitals:   05/16/18 1019  BP: 114/72  Pulse: 60  SpO2: 98%  Weight: 139 lb 3.2 oz (63.1 kg)  Height: 5' 2.5" (1.588 m)   RA  Gen: well appearing HENT: OP clear, TM's clear, neck supple PULM: CTA B, normal percussion CV: RRR, no mgr, trace edema GI: BS+, soft, nontender Derm: no cyanosis or rash Psyche: normal mood and affect    Imaging: Chest x-ray from our institution in 2011 was normal  PFT: Pulmonary function testing from 2012 were reviewed, no airflow obstruction, normal volumes, mildly depressed DLCO not corrected for hemoglobin.  CBC    Component Value Date/Time   WBC 6.5 05/14/2010 0934   RBC 4.31 05/14/2010 0934   HGB 14.0 09/03/2010 1253   HCT 39.8 05/14/2010 0934   PLT 213.0 05/14/2010 0934   MCV 92.3 05/14/2010 0934   MCHC 34.5 05/14/2010 0934   RDW 12.7 05/14/2010 0934   LYMPHSABS 2.7 05/14/2010 0934   MONOABS 0.5 05/14/2010 0934   EOSABS 0.5 05/14/2010 0934   BASOSABS 0.0 05/14/2010 0934   Exhaled nitric oxide test: 12/2016 50ppm     Assessment & Plan:    Irritable larynx  Cough variant asthma  Gastroesophageal reflux disease with esophagitis  Discussion:  This has been a stable interval for [facility].  She has not  had an exacerbation of her asthma since the last visit.  Her symptoms are well controlled with Arnuity.  She does have some sinusitis symptoms from time to time.  These are well controlled with nasal fluticasone.  Plan: Mild persistent asthma: Continue Arnuity 1 puff daily Continue albuterol as needed for chest tightness wheezing or shortness of breath Practice good hand hygiene Stay physically active I am glad your immunizations are up-to-date  Allergic rhinitis: Continue nasal fluticasone 2 sprays each nostril daily no matter how you feel  We will see you back in 1 year or sooner if  needed  Greater than 50% of this 25-minute visit was spent face-to-face answering the patient's questions about her asthma as well as her mother's pulmonary fibrosis.   Current Outpatient Medications:  .  albuterol (PROVENTIL HFA;VENTOLIN HFA) 108 (90 Base) MCG/ACT inhaler, Inhale 2 puffs into the lungs every 6 (six) hours as needed for wheezing or shortness of breath., Disp: , Rfl:  .  amoxicillin (AMOXIL) 500 MG tablet, TAKE 2 TABLETS STAT, THEN 1 TAB EVERY EIGHT HOURS UNTIL GONE, Disp: , Rfl:  .  aspirin 81 MG tablet, Take 81 mg by mouth daily.  , Disp: , Rfl:  .  Fluticasone Furoate (ARNUITY ELLIPTA) 100 MCG/ACT AEPB, Inhale 1 puff into the lungs daily., Disp: 30 each, Rfl: 11 .  lansoprazole (PREVACID) 30 MG capsule, Take 30 mg by mouth 2 (two) times daily before a meal., Disp: , Rfl:  .  metoprolol (TOPROL-XL) 50 MG 24 hr tablet, Take 75 mg by mouth daily. , Disp: , Rfl:  .  montelukast (SINGULAIR) 10 MG tablet, Take 10 mg by mouth at bedtime., Disp: , Rfl:  .  Omega-3 Fatty Acids (FISH OIL) 1200 MG CAPS, Take 1 capsule by mouth daily., Disp: , Rfl:  .  telmisartan (MICARDIS) 40 MG tablet, TAKE 1 TABLET BY MOUTH EVERY DAY FOR BLOOD PRESSURE, Disp: , Rfl:  .  valACYclovir (VALTREX) 500 MG tablet, Take 1 tablet by mouth Daily., Disp: , Rfl:   Current Facility-Administered Medications:  .  0.9 %  sodium chloride infusion, 500 mL, Intravenous, Continuous, Danis, Andreas Blower, MD

## 2018-05-16 NOTE — Patient Instructions (Signed)
Mild persistent asthma: Continue Arnuity 1 puff daily Continue albuterol as needed for chest tightness wheezing or shortness of breath Practice good hand hygiene Stay physically active I am glad your immunizations are up-to-date  Allergic rhinitis: Continue nasal fluticasone 2 sprays each nostril daily no matter how you feel  We will see you back in 1 year or sooner if needed

## 2018-08-22 ENCOUNTER — Telehealth: Payer: Self-pay | Admitting: Pulmonary Disease

## 2018-08-22 ENCOUNTER — Ambulatory Visit (INDEPENDENT_AMBULATORY_CARE_PROVIDER_SITE_OTHER): Payer: Medicare Other | Admitting: Primary Care

## 2018-08-22 ENCOUNTER — Other Ambulatory Visit: Payer: Self-pay

## 2018-08-22 ENCOUNTER — Encounter: Payer: Self-pay | Admitting: Primary Care

## 2018-08-22 DIAGNOSIS — J4520 Mild intermittent asthma, uncomplicated: Secondary | ICD-10-CM | POA: Diagnosis not present

## 2018-08-22 MED ORDER — PREDNISONE 10 MG PO TABS: ORAL_TABLET | ORAL | 0 refills | Status: DC

## 2018-08-22 NOTE — Telephone Encounter (Signed)
Schedule televisit / video visit with APP - TN or BW   Aaron Edelman

## 2018-08-22 NOTE — Telephone Encounter (Signed)
Primary Pulmonologist: BQ Last office visit and with whom: 05/16/2018 with BQ What do we see them for (pulmonary problems): cough/irritable larynx Last OV assessment/plan: Instructions  Return in about 1 year (around 05/16/2019).  Mild persistent asthma: Continue Arnuity 1 puff daily Continue albuterol as needed for chest tightness wheezing or shortness of breath Practice good hand hygiene Stay physically active I am glad your immunizations are up-to-date  Allergic rhinitis: Continue nasal fluticasone 2 sprays each nostril daily no matter how you feel  We will see you back in 1 year or sooner if needed       Was appointment offered to patient (explain)?  Pt wants meds prescribed   Reason for call: Called and spoke with pt who stated she has been coughing off and on x2 weeks now. Pt is coughing up clear mucus.  Pt stated she has had to use her rescue inhaler once this morning and states she has not used it any other times. Pt is taking her singulair and using her arnuity inhaler as prescribed.  Pt stated that she has not tried any OTC meds to see if it would help with her cough.  Pt does have complaints of SOB with exertion and also states that she is wheezing.  Pt denies any complaints of fever, body aches, or chills. Pt is requesting to have something prescribed to help with her symptoms. Aaron Edelman, please advise on this for pt. Thanks!

## 2018-08-22 NOTE — Progress Notes (Signed)
Reviewed, agree 

## 2018-08-22 NOTE — Patient Instructions (Addendum)
  Cough recommendations: Voice rest for three days (don't talk, laugh, sing or clear your throat) Use hard candies or non-mint/menthol cough drops to soothe throat Use cough suppressant such as delsym twice daily  Use netti pot twice daily followed by flonase OR nasacort   Asthma: Continue Arnuity OR Alvesco as prescribed daily Use ventolin rescue inhaler 2 puffs every 4-6 hours for breakthrough shortness of breath  Rx: Prednisone taper (40mg  x 2 days; 30mg  x 2 days; 20mg  x 2 days; 10mg  x 2 days)  Follow-up: Call if cough is not better in 5 days or you develop fever/ colored mucus

## 2018-08-22 NOTE — Progress Notes (Signed)
Virtual Visit via Telephone Note  I connected with Jessica Woodard on 08/22/18 at 10:30 AM EDT by telephone and verified that I am speaking with the correct person using two identifiers.  Location: Patient: Home Provider: Office   I discussed the limitations, risks, security and privacy concerns of performing an evaluation and management service by telephone and the availability of in person appointments. I also discussed with the patient that there may be a patient responsible charge related to this service. The patient expressed understanding and agreed to proceed.  History of Present Illness: 68 year old female, never smoked.  Medical history significant for variant asthma, irritable larynx, allergic rhinitis, GERD. Patient of Dr. Lake Bells, last seen on 05/16/18. Maintained on Arnuity 165mcg.   08/22/2018 Patient called office with complaints of cough and chest congestion x2-3 weeks. Cough is productive with clear mucus. Associated shortness of breath and wheezing. She has some nasal congestion and allergy symptoms. Not currently using nasal steriod or irrigation. She continues Arnuity and singulair as prescribed. Asking if she can use up previous Alvesco hfa in place of Arnuity d/t cost. Denies fever, sick contact or flu-like symptoms.   Observations/Objective:  - Mild throat clearing/cough. No significant shortness of breath or wheezing observed during phone conversation.    Assessment and Plan:  Asthmatic bronchitis - SOB/wheezing and cough x 3 weeks - No signs of bacterial infection, holding off on abx at this time  - RX prednisone taper (40mg  x 2 days; 30mg  x 2 days; 20mg  x 2 days; 10mg  x 2 days) - Continue Arnutiy 174mcg OR alvesco 54mcg as prescribed daily - Recommend voice rest/clearing throat, use hard candies to soothe throat - Recommend delsym cough suppressant twice daily   Allergic rhinitis - Resume netti pot twice daily  - Resume flonase/nasacort daily  - Continue  Singulair 10mg  at bedtime   Follow Up Instructions:   - Call if cough is not better in 5 days or you develop fever/ colored mucus   I discussed the assessment and treatment plan with the patient. The patient was provided an opportunity to ask questions and all were answered. The patient agreed with the plan and demonstrated an understanding of the instructions.   The patient was advised to call back or seek an in-person evaluation if the symptoms worsen or if the condition fails to improve as anticipated.  I provided 22 minutes of non-face-to-face time during this encounter.   Martyn Ehrich, NP

## 2018-08-22 NOTE — Telephone Encounter (Signed)
televisit with Jessica Woodard this morning at 10:30 am

## 2019-06-06 DIAGNOSIS — I10 Essential (primary) hypertension: Secondary | ICD-10-CM | POA: Diagnosis not present

## 2019-08-23 DIAGNOSIS — H04123 Dry eye syndrome of bilateral lacrimal glands: Secondary | ICD-10-CM | POA: Diagnosis not present

## 2019-08-23 DIAGNOSIS — H1045 Other chronic allergic conjunctivitis: Secondary | ICD-10-CM | POA: Diagnosis not present

## 2019-08-23 DIAGNOSIS — H532 Diplopia: Secondary | ICD-10-CM | POA: Diagnosis not present

## 2019-08-23 DIAGNOSIS — H5034 Intermittent alternating exotropia: Secondary | ICD-10-CM | POA: Diagnosis not present

## 2019-08-31 DIAGNOSIS — L309 Dermatitis, unspecified: Secondary | ICD-10-CM | POA: Diagnosis not present

## 2019-08-31 DIAGNOSIS — L218 Other seborrheic dermatitis: Secondary | ICD-10-CM | POA: Diagnosis not present

## 2019-09-26 DIAGNOSIS — N9089 Other specified noninflammatory disorders of vulva and perineum: Secondary | ICD-10-CM | POA: Diagnosis not present

## 2019-09-26 DIAGNOSIS — N760 Acute vaginitis: Secondary | ICD-10-CM | POA: Diagnosis not present

## 2019-09-26 DIAGNOSIS — A609 Anogenital herpesviral infection, unspecified: Secondary | ICD-10-CM | POA: Diagnosis not present

## 2019-10-12 DIAGNOSIS — L218 Other seborrheic dermatitis: Secondary | ICD-10-CM | POA: Diagnosis not present

## 2019-10-12 DIAGNOSIS — L309 Dermatitis, unspecified: Secondary | ICD-10-CM | POA: Diagnosis not present

## 2019-10-15 DIAGNOSIS — L309 Dermatitis, unspecified: Secondary | ICD-10-CM | POA: Diagnosis not present

## 2019-10-15 DIAGNOSIS — Z1231 Encounter for screening mammogram for malignant neoplasm of breast: Secondary | ICD-10-CM | POA: Diagnosis not present

## 2019-12-12 DIAGNOSIS — Z01419 Encounter for gynecological examination (general) (routine) without abnormal findings: Secondary | ICD-10-CM | POA: Diagnosis not present

## 2019-12-12 DIAGNOSIS — N958 Other specified menopausal and perimenopausal disorders: Secondary | ICD-10-CM | POA: Diagnosis not present

## 2019-12-12 DIAGNOSIS — M8588 Other specified disorders of bone density and structure, other site: Secondary | ICD-10-CM | POA: Diagnosis not present

## 2019-12-12 DIAGNOSIS — Z6823 Body mass index (BMI) 23.0-23.9, adult: Secondary | ICD-10-CM | POA: Diagnosis not present

## 2019-12-25 DIAGNOSIS — Z1389 Encounter for screening for other disorder: Secondary | ICD-10-CM | POA: Diagnosis not present

## 2019-12-25 DIAGNOSIS — E7800 Pure hypercholesterolemia, unspecified: Secondary | ICD-10-CM | POA: Diagnosis not present

## 2019-12-25 DIAGNOSIS — E1169 Type 2 diabetes mellitus with other specified complication: Secondary | ICD-10-CM | POA: Diagnosis not present

## 2019-12-25 DIAGNOSIS — K219 Gastro-esophageal reflux disease without esophagitis: Secondary | ICD-10-CM | POA: Diagnosis not present

## 2019-12-25 DIAGNOSIS — I10 Essential (primary) hypertension: Secondary | ICD-10-CM | POA: Diagnosis not present

## 2019-12-25 DIAGNOSIS — Z0000 Encounter for general adult medical examination without abnormal findings: Secondary | ICD-10-CM | POA: Diagnosis not present

## 2019-12-25 DIAGNOSIS — J309 Allergic rhinitis, unspecified: Secondary | ICD-10-CM | POA: Diagnosis not present

## 2019-12-25 DIAGNOSIS — J45909 Unspecified asthma, uncomplicated: Secondary | ICD-10-CM | POA: Diagnosis not present

## 2019-12-28 ENCOUNTER — Ambulatory Visit (INDEPENDENT_AMBULATORY_CARE_PROVIDER_SITE_OTHER): Payer: Medicare PPO

## 2019-12-28 ENCOUNTER — Encounter: Payer: Self-pay | Admitting: Pulmonary Disease

## 2019-12-28 ENCOUNTER — Ambulatory Visit: Payer: Medicare PPO | Admitting: Pulmonary Disease

## 2019-12-28 ENCOUNTER — Other Ambulatory Visit: Payer: Self-pay

## 2019-12-28 VITALS — BP 116/66 | HR 67 | Temp 98.6°F | Ht 62.0 in | Wt 130.0 lb

## 2019-12-28 DIAGNOSIS — J4530 Mild persistent asthma, uncomplicated: Secondary | ICD-10-CM

## 2019-12-28 DIAGNOSIS — D869 Sarcoidosis, unspecified: Secondary | ICD-10-CM

## 2019-12-28 DIAGNOSIS — J4531 Mild persistent asthma with (acute) exacerbation: Secondary | ICD-10-CM | POA: Diagnosis not present

## 2019-12-28 DIAGNOSIS — K219 Gastro-esophageal reflux disease without esophagitis: Secondary | ICD-10-CM | POA: Diagnosis not present

## 2019-12-28 IMAGING — DX DG CHEST 2V
2 series · 2 of 2 positions shown · non-contrast
Comparison: [DATE]

CLINICAL DATA: Sarcoid

EXAM:
CHEST - 2 VIEW

[chest pa]
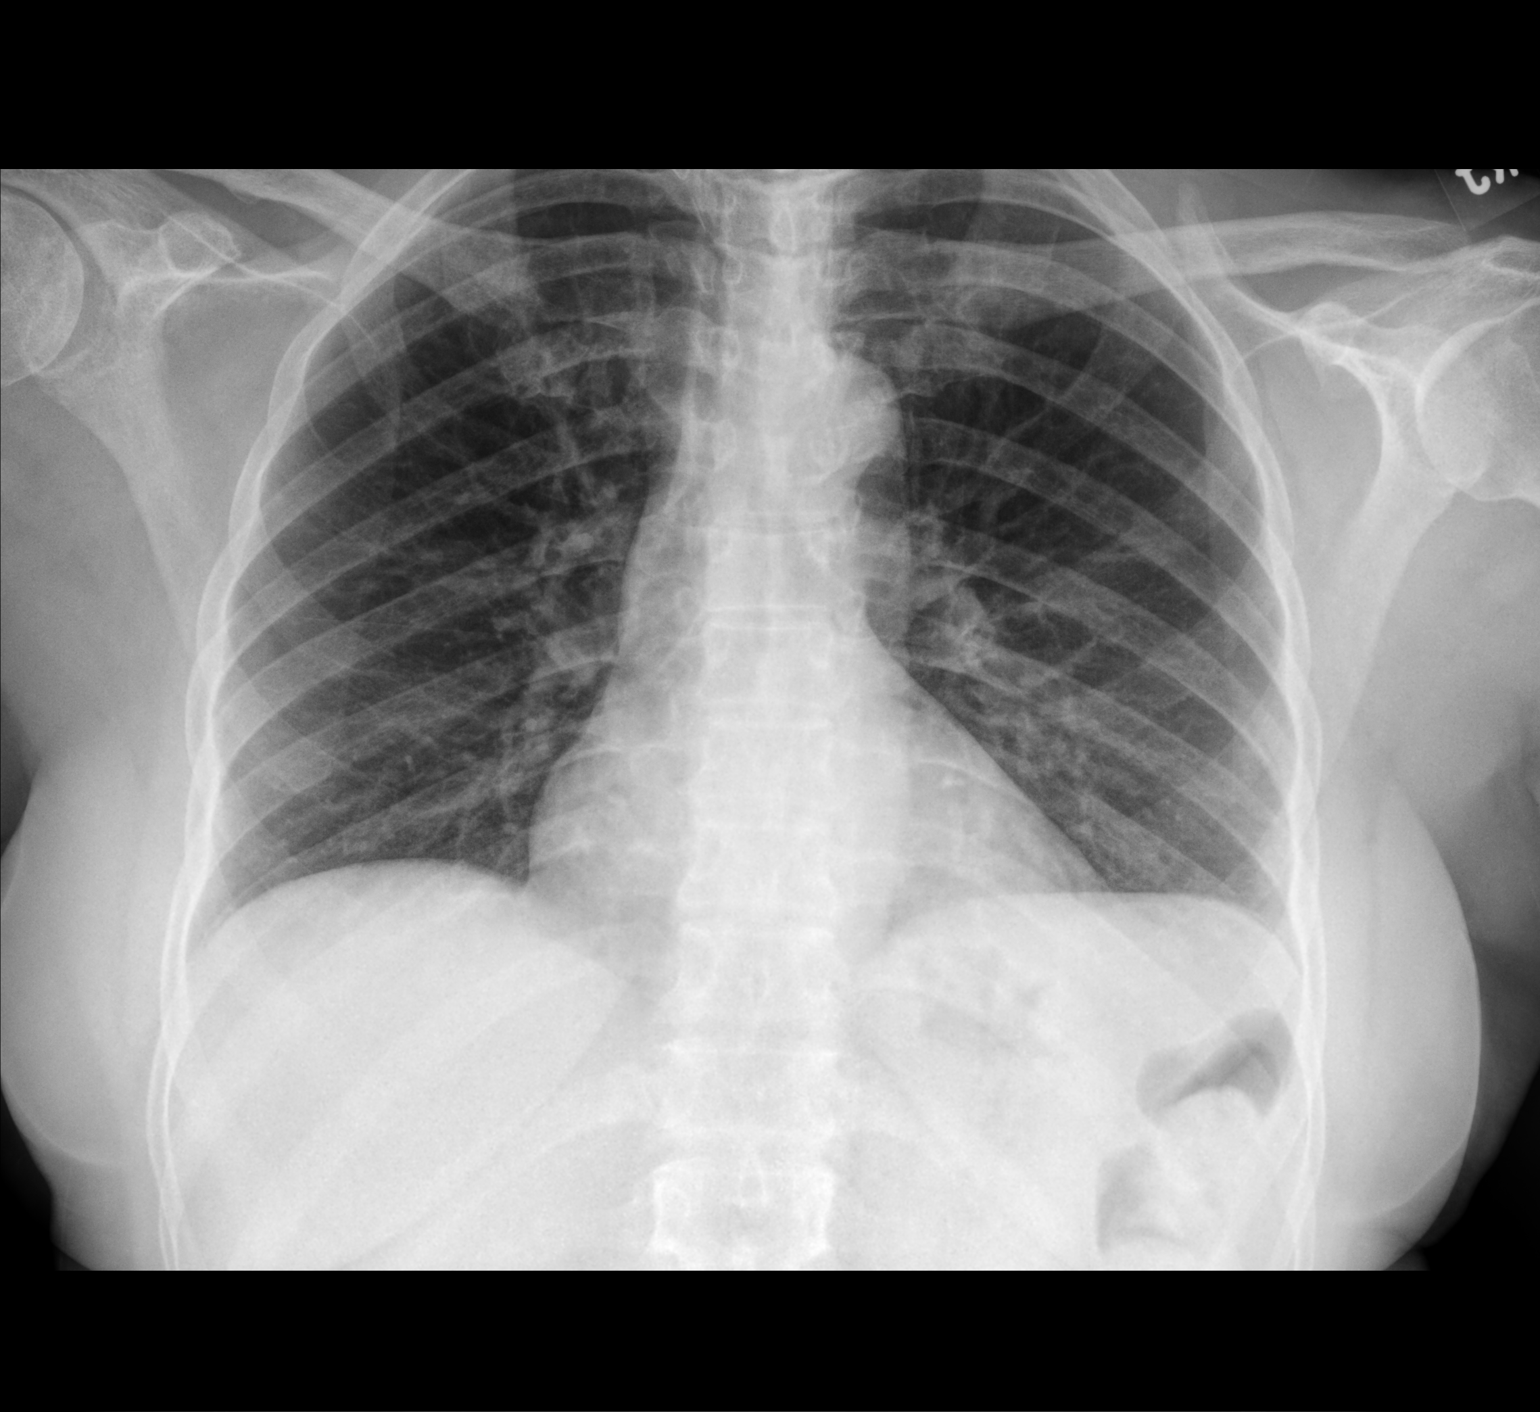

[chest lat]
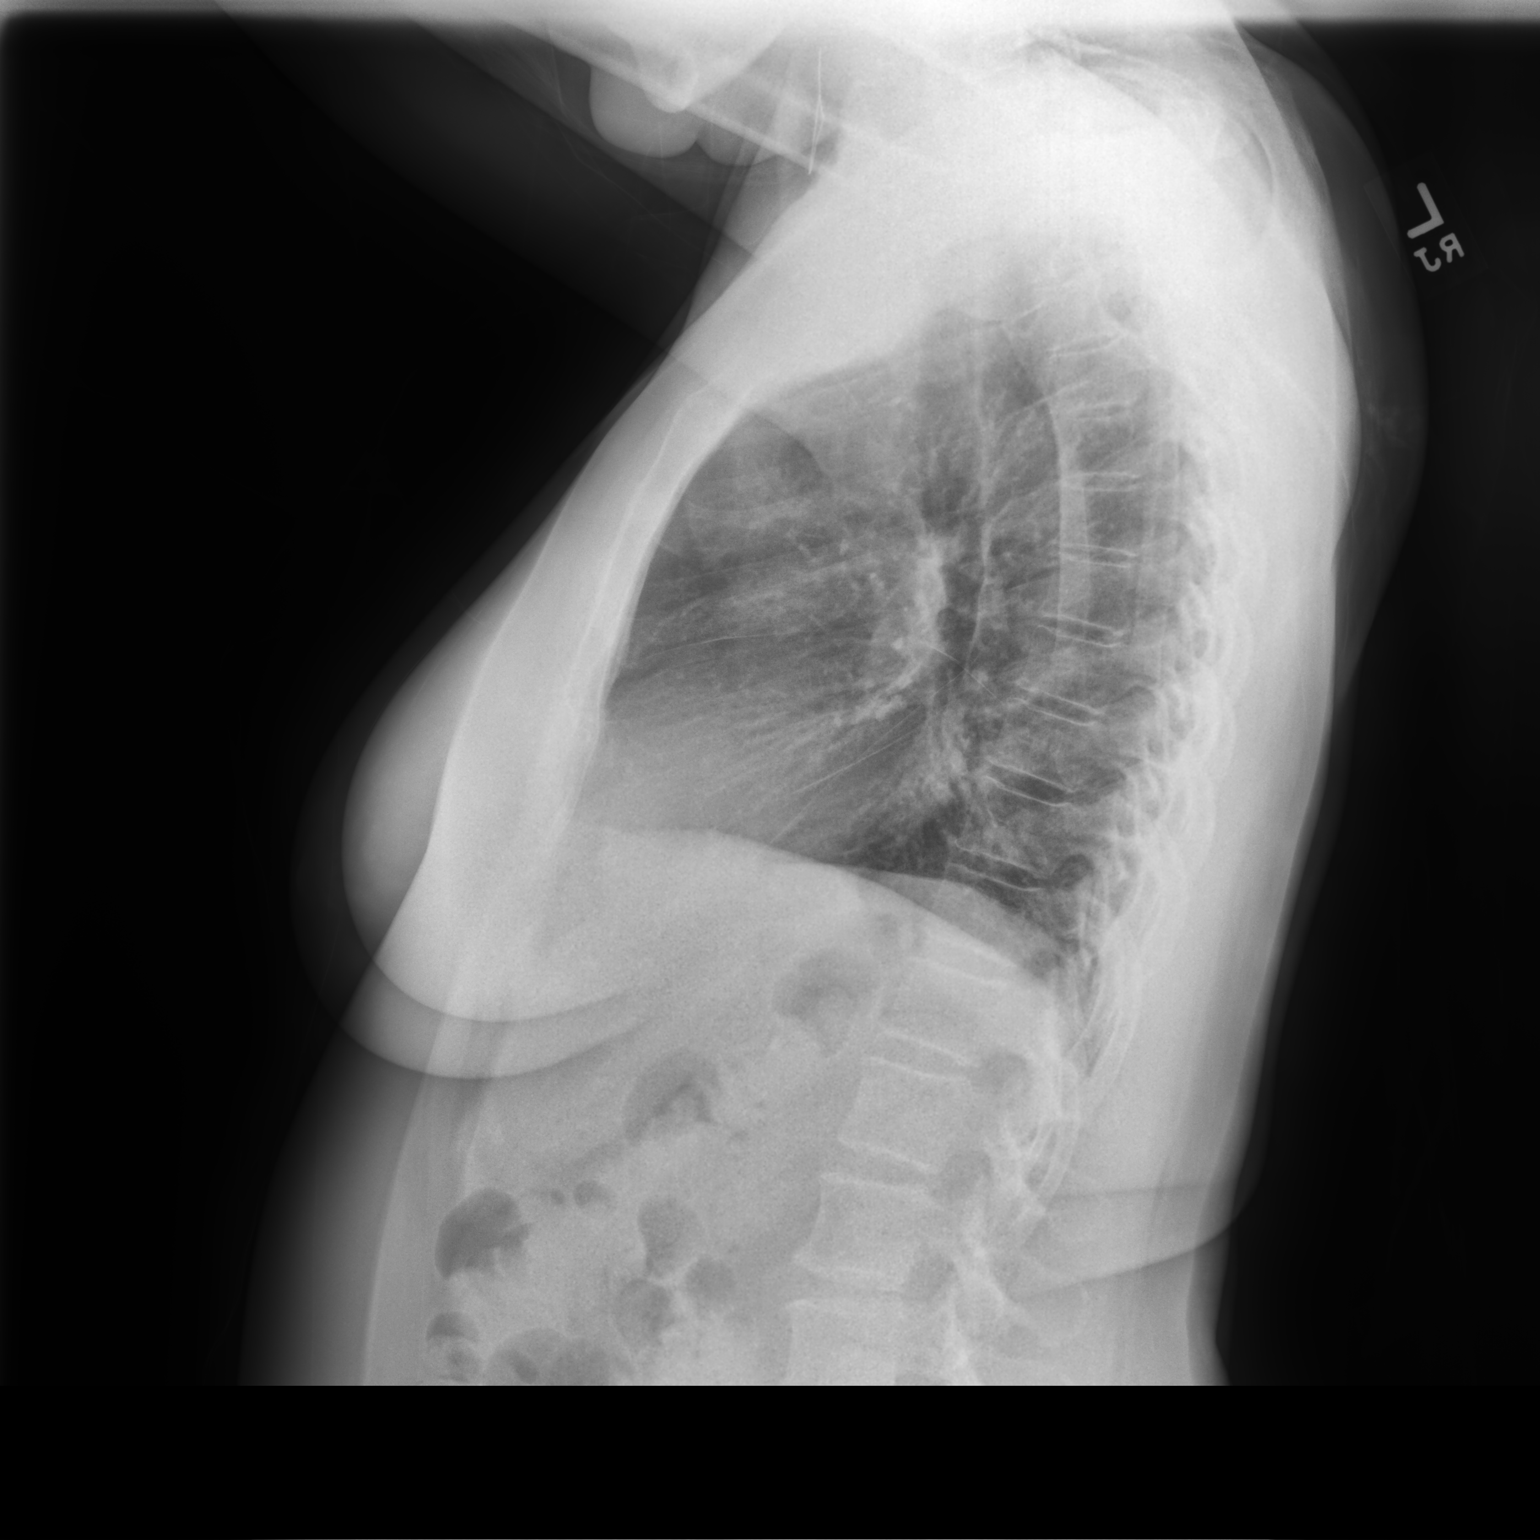

[2 of 2 positions shown; findings below may reference images not displayed]

FINDINGS: Mildly low lung volumes. No visible pulmonary scarring or
adenopathy. Normal heart size. No osseous findings
IMPRESSION: Stable and negative chest.

## 2019-12-28 MED ORDER — ALVESCO 80 MCG/ACT IN AERS: 1.0000 | INHALATION_SPRAY | Freq: Two times a day (BID) | RESPIRATORY_TRACT | 0 refills | Status: DC

## 2019-12-28 MED ORDER — ALBUTEROL SULFATE HFA 108 (90 BASE) MCG/ACT IN AERS: 2.0000 | INHALATION_SPRAY | Freq: Four times a day (QID) | RESPIRATORY_TRACT | 3 refills | Status: DC | PRN

## 2019-12-28 NOTE — Patient Instructions (Addendum)
Mild persistent asthma --START Alvesco 80 mcg ONE puff twice a day --USE Albuterol inhaler as needed for shortness of breath --Will arrange for Pulmonary Function Test  History of sarcoid --CXR today --Will obtain records from Ophthalmologist and PCP regarding diagnosis and labs --Will obtain EKG at next visit  Follow-up with me in 3 months

## 2019-12-28 NOTE — Progress Notes (Signed)
Subjective:   PATIENT ID: Jessica Woodard GENDER: female DOB: 10/07/1950, MRN: [id number]   HPI  Chief Complaint  Patient presents with  . New Patient (Initial Visit)    Asthma, former patient of Jessica Woodard, recent dry cough, [facility]    Reason for Visit: New patient for asthma  [name] is a 69 year old female never smoker with asthma, allergic rhinitis, sarcoid and GERD who presents for follow-up.  She is a former patient of Jessica Woodard. She was last seen in the office on 05/15/28 with McQuaid and on 08/22/18 by Jessica Woodard in the Pulmonary office for intermittent asthma and allergic rhinitis. She was previously on Arnuity however not currently on this inhaler. She has been using her sister's inhaler called Alvesco. She was last seen by Jessica Woodard on 08/22/18 via telephone visit. She   She will have shortness of breath with exertion. She will have worsening dry cough and occasional wheezing. No sputum production  Triggers include illnesses, changes weather and humidity, cold, tree and pollen, grass. She has to sleep elevated otherwise she starts coughing. She rarely uses her albuterol, every few months at most. She reports getting sick 1-2 times a year. Denies any recent exacerbations requiring steroids. Reports she is well-controlled on Alvesco.  She reports a history of sarcoid that was diagnosed mayn years ago in her eyes. She reports double vision. She is followed by an Ophthalmologist, Jessica Woodard. Denies lung or skin involvement. However she was advised to do a skin biopsy last month for a new lesion on her face however she declined.  [facility] for laryngopharyngeal reflux. On PPI  Social History: Never smoker  I have personally reviewed patient's past medical/family/social history, allergies, current medications.  Past Medical History:  Diagnosis Date  . Allergic rhinitis, cause unspecified   . Allergy    seasonal    . Anxiety    sleep walking/night terrors  . Asthma    inhalers  . GERD (gastroesophageal reflux disease)   . Hypertension   . Sarcoid      Family History  Problem Relation Age of Onset  . Asthma Mother   . Colon polyps Mother   . Diabetes Mother   . Heart disease Mother   . Stroke Mother   . Kidney disease Mother   . Asthma Maternal Grandmother   . Asthma Maternal Grandfather   . Asthma Paternal Grandfather   . Asthma Paternal Grandmother   . Asthma Sister   . Breast cancer Sister   . Diabetes Sister   . Asthma Brother   . Colon polyps Brother   . Heart disease Brother   . Colon cancer Neg Hx      Social History   Occupational History  . Occupation: Retired Runner, broadcasting/film/videoteacher  Tobacco Use  . Smoking status: Never Smoker  . Smokeless tobacco: Never Used  Vaping Use  . Vaping Use: Never used  Substance and Sexual Activity  . Alcohol use: No    Alcohol/week: 0.0 standard drinks  . Drug use: No  . Sexual activity: Not on file    Allergies  Allergen Reactions  . Sulfa Antibiotics Rash  . Cefdinir     REACTION: diarrhea  . Other Itching  . Roflumilast Diarrhea  . Sulfonamide Derivatives Rash     Outpatient Medications Prior to Visit  Medication Sig Dispense Refill  . albuterol (PROVENTIL HFA;VENTOLIN HFA) 108 (90 Base) MCG/ACT inhaler  Inhale 2 puffs into the lungs every 6 (six) hours as needed for wheezing or shortness of breath.    Marland Kitchen aspirin 81 MG tablet Take 81 mg by mouth daily.      . lansoprazole (PREVACID) 30 MG capsule Take 30 mg by mouth 2 (two) times daily before a meal.    . metFORMIN (GLUCOPHAGE-XR) 500 MG 24 hr tablet     . metoprolol (TOPROL-XL) 50 MG 24 hr tablet Take 75 mg by mouth daily.     . montelukast (SINGULAIR) 10 MG tablet Take 10 mg by mouth at bedtime.    . Omega-3 Fatty Acids (FISH OIL) 1200 MG CAPS Take 1 capsule by mouth daily.    Jessica Woodard Lancets 33G MISC     . Propylene Glycol 0.6 % SOLN Apply to eye.    . spironolactone  (ALDACTONE) 25 MG tablet     . telmisartan (MICARDIS) 40 MG tablet TAKE 1 TABLET BY MOUTH EVERY DAY FOR BLOOD PRESSURE    . valACYclovir (VALTREX) 500 MG tablet Take 1 tablet by mouth Daily.    . Fluticasone Furoate (ARNUITY ELLIPTA) 100 MCG/ACT AEPB Inhale 1 puff into the lungs daily. (Patient not taking: Reported on 12/28/2019) 30 each 11  . predniSONE (DELTASONE) 10 MG tablet Take 4 tabs po daily x 2 days; then 3 tabs for 2 days; then 2 tabs for 2 days; then 1 tab for 2 days 20 tablet 0   Facility-Administered Medications Prior to Visit  Medication Dose Route Frequency Provider Last Rate Last Admin  . 0.9 %  sodium chloride infusion  500 mL Intravenous Continuous Jessica Pitter III, MD        Review of Systems  Constitutional: Negative for chills, diaphoresis, fever, malaise/fatigue and weight loss.  HENT: Negative for congestion, ear pain and sore throat.   Eyes: Positive for double vision. Negative for blurred vision, pain, discharge and redness.  Respiratory: Positive for cough, shortness of breath and wheezing. Negative for hemoptysis and sputum production.   Cardiovascular: Negative for chest pain, palpitations and leg swelling.  Gastrointestinal: Negative for abdominal pain, heartburn and nausea.  Genitourinary: Negative for frequency.  Musculoskeletal: Negative for joint pain and myalgias.  Skin: Negative for itching and rash.  Neurological: Negative for dizziness, weakness and headaches.  Endo/Heme/Allergies: Does not bruise/bleed easily.  Psychiatric/Behavioral: Negative for depression. The patient is not nervous/anxious.     Objective:   Vitals:   12/28/19 1336  BP: 116/66  Pulse: 67  Temp: 98.6 F (37 C)  TempSrc: Oral  SpO2: 100%  Weight: 130 lb (59 kg)  Height: 5\' 2"  (1.575 m)   SpO2: 100 % O2 Device: None (Room air)  Physical Exam: General: Well-appearing, no acute distress HENT: [place], AT Eyes: EOMI, no scleral icterus Respiratory: Clear to auscultation  bilaterally.  No crackles, wheezing or rales Cardiovascular: RRR, -M/R/G, no JVD GI: BS+, soft, nontender Extremities:-Edema,-tenderness Neuro: AAO x4, CNII-XII grossly intact Skin: Right hyperpigmentation of the right cheek, no rashes or bruising Psych: Normal mood, normal affect  Data Reviewed:  Imaging: CXR 12/28/19 - Normal CXR. No infiltrates, effusion or edema. No hilar adenopathy   PFT: None  Labs: CBC    Component Value Date/Time   WBC 6.5 05/14/2010 0934   RBC 4.31 05/14/2010 0934   HGB 14.0 09/03/2010 1253   HCT 39.8 05/14/2010 0934   PLT 213.0 05/14/2010 0934   MCV 92.3 05/14/2010 0934   MCHC 34.5 05/14/2010 0934   RDW 12.7 05/14/2010  0934   LYMPHSABS 2.7 05/14/2010 0934   MONOABS 0.5 05/14/2010 0934   EOSABS 0.5 05/14/2010 0934   BASOSABS 0.0 05/14/2010 0934   BMET    Component Value Date/Time   NA 142 09/02/2010 1330   K 4.3 09/02/2010 1330   CL 105 09/02/2010 1330   CO2 31 09/02/2010 1330   GLUCOSE 87 09/02/2010 1330   BUN 11 09/02/2010 1330   CREATININE 0.81 09/02/2010 1330   CALCIUM 9.7 09/02/2010 1330   GFRNONAA >60 09/02/2010 1330   GFRAA >60 09/02/2010 1330   Imaging, labs and test noted above have been reviewed independently by me.    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 She was lost to follow-up and is now establishing care with me. Her asthma is well-controlled on Alvesco. This medication was previously given to her in a study and after study discontinued, she has been using her sister's supply.  Mild persistent asthma --START Alvesco 80 mcg ONE puff twice a day --USE Albuterol inhaler as needed for shortness of breath --Will arrange for Pulmonary Function Test  History of sarcoid with eye involvement --Diagnosed by her ophthalmologist >10 years ago. No evidence of pulmonary involvement per Dr.  Kendrick Fries --No indication for prednisone therapy --Will need annual PFTs. PFTs ordered as above --Recommend annual ophthalmology exam.  Last visit on July 2021.  --EKG to be ordered on next visit --CXR today --Will obtain records from Ophthalmologist Jessica Booze) and PCP Manus Gunning) regarding diagnosis and labs (recent CBC and CMP) --If she has not obtained skin biopsy at the next visit, will discuss referral to Dermatology  Laryngopharyngeal Reflux --Previously followed by ENT at [facility] with Dr. Jenne Pane --Continue Lansoprazole 30 mg BID  Health Maintenance Immunization History  Administered Date(s) Administered  . Influenza Split 12/14/2014, 11/30/2016  . Influenza Whole 01/13/2009, 12/22/2009  . Influenza-Unspecified 01/21/2011   CT Lung Screen - not qualified. Never smoker  Orders Placed This Encounter  Procedures  . DG Chest 2 View    Standing Status:   Future    Number of Occurrences:   1    Standing Expiration Date:   12/27/2020    Order Specific Question:   Reason for Exam (SYMPTOM  OR DIAGNOSIS REQUIRED)    Answer:   Sarcoidosis    Order Specific Question:   Preferred imaging location?    Answer:   Internal  . Pulmonary function test    Standing Status:   Future    Standing Expiration Date:   12/27/2020    Order Specific Question:   Where should this test be performed?    Answer:   [facility]    Order Specific Question:   Full PFT: includes the following: basic spirometry, spirometry pre & post bronchodilator, diffusion capacity (DLCO), lung volumes    Answer:   Full PFT   Meds ordered this encounter  Medications  . albuterol (VENTOLIN HFA) 108 (90 Base) MCG/ACT inhaler    Sig: Inhale 2 puffs into the lungs every 6 (six) hours as needed for wheezing or shortness of breath.    Dispense:  1 each    Refill:  3  . ciclesonide (ALVESCO) 80 MCG/ACT inhaler    Sig: Inhale 1 puff into the lungs 2 (two) times daily.    Dispense:  1 each    Refill:  0    Return in  about 3  months (around 03/29/2020).  I have spent a total time of 40-minutes on the day of the appointment reviewing prior documentation, coordinating care and discussing medical diagnosis and plan with the patient/family. Imaging, labs and tests included in this note have been reviewed and interpreted independently by me.  Ermine Spofford Mechele Collin, MD [place] [facility] 12/28/2019 1:39 PM  Office Number [phone]

## 2019-12-30 ENCOUNTER — Encounter: Payer: Self-pay | Admitting: Pulmonary Disease

## 2019-12-30 DIAGNOSIS — J4530 Mild persistent asthma, uncomplicated: Secondary | ICD-10-CM | POA: Insufficient documentation

## 2020-01-22 ENCOUNTER — Ambulatory Visit (INDEPENDENT_AMBULATORY_CARE_PROVIDER_SITE_OTHER): Payer: Medicare PPO | Admitting: Pulmonary Disease

## 2020-01-22 ENCOUNTER — Other Ambulatory Visit: Payer: Self-pay

## 2020-01-22 DIAGNOSIS — D869 Sarcoidosis, unspecified: Secondary | ICD-10-CM | POA: Diagnosis not present

## 2020-01-22 DIAGNOSIS — J4530 Mild persistent asthma, uncomplicated: Secondary | ICD-10-CM

## 2020-01-22 LAB — PULMONARY FUNCTION TEST
DL/VA % pred: 108 %
DL/VA: 4.56 ml/min/mmHg/L
DLCO cor % pred: 86 %
DLCO cor: 15.78 ml/min/mmHg
DLCO unc % pred: 86 %
DLCO unc: 15.78 ml/min/mmHg
FEF 25-75 Post: 2.64 L/sec
FEF 25-75 Pre: 2.53 L/sec
FEF2575-%Change-Post: 4 %
FEF2575-%Pred-Post: 166 %
FEF2575-%Pred-Pre: 159 %
FEV1-%Change-Post: 3 %
FEV1-%Pred-Post: 110 %
FEV1-%Pred-Pre: 106 %
FEV1-Post: 1.86 L
FEV1-Pre: 1.79 L
FEV1FVC-%Change-Post: 2 %
FEV1FVC-%Pred-Pre: 110 %
FEV6-%Change-Post: 1 %
FEV6-%Pred-Post: 101 %
FEV6-%Pred-Pre: 99 %
FEV6-Post: 2.12 L
FEV6-Pre: 2.08 L
FEV6FVC-%Pred-Post: 104 %
FEV6FVC-%Pred-Pre: 104 %
FVC-%Change-Post: 1 %
FVC-%Pred-Post: 97 %
FVC-%Pred-Pre: 96 %
FVC-Post: 2.12 L
FVC-Pre: 2.1 L
Post FEV1/FVC ratio: 88 %
Post FEV6/FVC ratio: 100 %
Pre FEV1/FVC ratio: 86 %
Pre FEV6/FVC Ratio: 100 %
RV % pred: 70 %
RV: 1.45 L
TLC % pred: 76 %
TLC: 3.65 L

## 2020-01-22 NOTE — Progress Notes (Signed)
Full PFT performed today. °

## 2020-01-23 DIAGNOSIS — E119 Type 2 diabetes mellitus without complications: Secondary | ICD-10-CM | POA: Diagnosis not present

## 2020-01-23 DIAGNOSIS — H40013 Open angle with borderline findings, low risk, bilateral: Secondary | ICD-10-CM | POA: Diagnosis not present

## 2020-01-23 DIAGNOSIS — H1045 Other chronic allergic conjunctivitis: Secondary | ICD-10-CM | POA: Diagnosis not present

## 2020-01-23 DIAGNOSIS — H532 Diplopia: Secondary | ICD-10-CM | POA: Diagnosis not present

## 2020-01-23 DIAGNOSIS — H5010 Unspecified exotropia: Secondary | ICD-10-CM | POA: Diagnosis not present

## 2020-01-23 DIAGNOSIS — H2513 Age-related nuclear cataract, bilateral: Secondary | ICD-10-CM | POA: Diagnosis not present

## 2020-01-23 DIAGNOSIS — H04123 Dry eye syndrome of bilateral lacrimal glands: Secondary | ICD-10-CM | POA: Diagnosis not present

## 2020-01-25 ENCOUNTER — Telehealth: Payer: Self-pay | Admitting: Family Medicine

## 2020-01-25 NOTE — Telephone Encounter (Signed)
The provider of this encounter should have been Dr. Everardo All as that is pt's primary pulmonologist.  Called and spoke with pt who is requesting to know the results of the PFT which was performed on 01/22/20. Dr. Everardo All, please advise.

## 2020-01-31 NOTE — Telephone Encounter (Signed)
Called and spoke with pt letting her know the results of the PFT and she verbalized understanding. Pt is to f/u with Dr. Everardo All in January 2022 so I have scheduled her f/u appt. Nothing further needed.

## 2020-01-31 NOTE — Telephone Encounter (Signed)
I was scheduled off on 01/25/20 at the time of this message. This should have been triaged to the doc/NP of the day.  PFT Results: Mild restrictive defect with normal gas exchange.  I attempted to call patient today for test results and unable to reach her or leave VM due to full inbox.  Plan Staff please call patient again with the above results.  No action required based on the results. She will need repeat PFTs in one year or if she develops respiratory symptoms.  Mechele Collin, M.D. [facility] Pulmonary/Critical Care Medicine 01/31/2020 11:03 AM

## 2020-03-25 ENCOUNTER — Encounter: Payer: Self-pay | Admitting: Pulmonary Disease

## 2020-03-25 ENCOUNTER — Other Ambulatory Visit: Payer: Self-pay

## 2020-03-25 ENCOUNTER — Ambulatory Visit (INDEPENDENT_AMBULATORY_CARE_PROVIDER_SITE_OTHER): Payer: Medicare PPO | Admitting: Pulmonary Disease

## 2020-03-25 VITALS — BP 120/62 | HR 63 | Ht 62.0 in | Wt 132.4 lb

## 2020-03-25 DIAGNOSIS — D869 Sarcoidosis, unspecified: Secondary | ICD-10-CM | POA: Diagnosis not present

## 2020-03-25 DIAGNOSIS — R001 Bradycardia, unspecified: Secondary | ICD-10-CM | POA: Diagnosis not present

## 2020-03-25 NOTE — Progress Notes (Signed)
Subjective:   PATIENT ID: Jessica Woodard GENDER: female DOB: September 02, 1950, MRN: [id number]   HPI  Chief Complaint  Patient presents with  . Follow-up    Pt states she has been doing okay since last visit. Denies any real complaints other than the mask making her nose run.    Reason for Visit: New patient for asthma  [name] is a 70 year old female never smoker with asthma, allergic rhinitis, sarcoid and GERD who presents for follow-up.  Synopsis: Diagnosed with sarcoid by ophthalmologist >10 years. Currently followed by Ophthalmologist, Dr. Dione Booze. Previously followed by Dr. Kendrick Fries with no pulmonary involvement. Lost to follow-up and re-established care with Pulmonary in 2021. Recently she was advised by Dermatology for a skin biopsy last month for a new lesion on her face however she declined.  Overall her asthma symptoms are well-controlled. She reports using Alvesco ONE puff once a day in the morning. She notices shortness of breath with occasional wheezing with heavy exertion like raking leaves etc. Does not use a rescue inhaler. She does notice symptoms occurring at night when laying down.   She reports left facial hyperpigmentation that has been present for the last two years and new right hyperpigmented spot on her right hand. She has previously been advised to have biopsy and is awaiting appointment with [facility] Dermat[facility] in October. She is concerned what this lesion is and has not had any answers. Does not improve with any OTC creams or lotions.  Social History: Never smoker  I have personally reviewed patient's past medical/family/social history, allergies, current medications.  Past Medical History:  Diagnosis Date  . Allergic rhinitis, cause unspecified   . Allergy    seasonal   . Anxiety    sleep walking/night terrors  . Asthma    inhalers  . BRONCHITIS, ACUTE 04/29/2009   Qualifier: Diagnosis of  By: Maple Hudson MD, Clinton D   . GERD (gastroesophageal reflux  disease)   . Hypertension   . Sarcoid      Family History  Problem Relation Age of Onset  . Asthma Mother   . Colon polyps Mother   . Diabetes Mother   . Heart disease Mother   . Stroke Mother   . Kidney disease Mother   . Asthma Maternal Grandmother   . Asthma Maternal Grandfather   . Asthma Paternal Grandfather   . Asthma Paternal Grandmother   . Asthma Sister   . Breast cancer Sister   . Diabetes Sister   . Asthma Brother   . Colon polyps Brother   . Heart disease Brother   . Colon cancer Neg Hx      Social History   Occupational History  . Occupation: Retired Runner, broadcasting/film/video  Tobacco Use  . Smoking status: Never Smoker  . Smokeless tobacco: Never Used  Vaping Use  . Vaping Use: Never used  Substance and Sexual Activity  . Alcohol use: No    Alcohol/week: 0.0 standard drinks  . Drug use: No  . Sexual activity: Not on file    Allergies  Allergen Reactions  . Sulfa Antibiotics Rash  . Cefdinir     REACTION: diarrhea  . Other Itching  . Roflumilast Diarrhea  . Sulfonamide Derivatives Rash     Outpatient Medications Prior to Visit  Medication Sig Dispense Refill  . albuterol (VENTOLIN HFA) 108 (90 Base) MCG/ACT inhaler Inhale 2 puffs into the lungs every 6 (six) hours as needed for wheezing or shortness of breath. 1 each  3  . APPLE CIDER VINEGAR PO See admin instructions.    Marland Kitchen aspirin 81 MG tablet Take 81 mg by mouth daily.    . ciclesonide (ALVESCO) 80 MCG/ACT inhaler Inhale 1 puff into the lungs 2 (two) times daily. 1 each 0  . lansoprazole (PREVACID) 30 MG capsule Take 30 mg by mouth 2 (two) times daily before a meal.    . metFORMIN (GLUCOPHAGE-XR) 500 MG 24 hr tablet     . metoprolol (TOPROL-XL) 50 MG 24 hr tablet Take 75 mg by mouth daily.    . montelukast (SINGULAIR) 10 MG tablet Take 10 mg by mouth at bedtime.    . Omega-3 Fatty Acids (FISH OIL) 1200 MG CAPS Take 1 capsule by mouth daily.    Letta Pate Delica Lancets 33G MISC     . Propylene Glycol 0.6 %  SOLN Apply to eye.    . rosuvastatin (CRESTOR) 5 MG tablet     . spironolactone (ALDACTONE) 25 MG tablet     . telmisartan (MICARDIS) 40 MG tablet TAKE 1 TABLET BY MOUTH EVERY DAY FOR BLOOD PRESSURE    . valACYclovir (VALTREX) 500 MG tablet Take 1 tablet by mouth Daily.     Facility-Administered Medications Prior to Visit  Medication Dose Route Frequency Provider Last Rate Last Admin  . 0.9 %  sodium chloride infusion  500 mL Intravenous Continuous Charlie Pitter III, MD        Review of Systems  Constitutional: Negative for chills, diaphoresis, fever, malaise/fatigue and weight loss.  HENT: Negative for congestion.   Eyes: Positive for double vision.  Respiratory: Positive for shortness of breath and wheezing. Negative for cough, hemoptysis and sputum production.   Cardiovascular: Negative for chest pain, palpitations and leg swelling.    Objective:   Vitals:   03/25/20 0915  BP: 120/62  Pulse: 63  SpO2: 100%  Weight: 132 lb 6.4 oz (60.1 kg)  Height: 5\' 2"  (1.575 m)   SpO2: 100 % O2 Device: None (Room air)  Physical Exam: General: Well-appearing, no acute distress HENT: [place], AT Eyes: EOMI, no scleral icterus Respiratory: Diminished breath sounds bilaterally.  No crackles, wheezing or rales Cardiovascular: RRR, -M/R/G, no JVD Extremities:-Edema,-tenderness Neuro: AAO x4, CNII-XII grossly intact Skin: Right hyperpigmentation of left cheek, small right macule on dorsal aspect of right hand Psych: Normal mood, normal affect  Data Reviewed:  Imaging: CXR 12/28/19 - Normal CXR. No infiltrates, effusion or edema. No hilar adenopathy   PFT: 01/22/20 FVC 2.12 (97%) FEV1 1.86 (110%) Ratio 86  TLC 76% DLCO 86% Interpretation: Mild restrictive defect with normal gas exchange.  Labs: CBC    Component Value Date/Time   WBC 6.5 05/14/2010 0934   RBC 4.31 05/14/2010 0934   HGB 14.0 09/03/2010 1253   HCT 39.8 05/14/2010 0934   PLT 213.0 05/14/2010 0934   MCV 92.3  05/14/2010 0934   MCHC 34.5 05/14/2010 0934   RDW 12.7 05/14/2010 0934   LYMPHSABS 2.7 05/14/2010 0934   MONOABS 0.5 05/14/2010 0934   EOSABS 0.5 05/14/2010 0934   BASOSABS 0.0 05/14/2010 0934   BMET    Component Value Date/Time   NA 142 09/02/2010 1330   K 4.3 09/02/2010 1330   CL 105 09/02/2010 1330   CO2 31 09/02/2010 1330   GLUCOSE 87 09/02/2010 1330   BUN 11 09/02/2010 1330   CREATININE 0.81 09/02/2010 1330   CALCIUM 9.7 09/02/2010 1330   GFRNONAA >60 09/02/2010 1330   GFRAA >60 09/02/2010 1330  Imaging, labs and test noted above have been reviewed independently by me.    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  We discussed the clinical course of sarcoid and benefit of routine monitoring for extrapulmonary symptoms. She does have restrictive defect and mild asthma-like symptoms likely related to her sarcoid. Currently concerned about worsening/new skin lesions and awaiting Dermatology evaluation. In-office she has sinus bradycardia. Prior EKG in 2012 also showed sinus bradycardia.   Her asthma-like symptoms are new this year and in setting of sinus bradycardia, will evaluate with cardiac MRI. Consulted with Dr. Gala Romney via phone.  Intermittent asthma - not controlled --CONTINUE Alvesco 80 mcg ONE puff twice a day --USE Albuterol OR Alvesco inhaler as needed for shortness of breath or wheezing --CONTINUE montelukast 10 mg daily  History of sarcoid with eye involvement Mild restrictive defect --Diagnosed by her ophthalmologist >10 years ago --No indication for prednisone therapy --Annual PFTs. Due 01/2021 --Recommend annual ophthalmology exam.  Last visit on July 2021.  --EKG showed sinus bradycardia. PR . Normal QT. Will order cardiac MRI --Please request Primary Care to obtain CBC and CMP. Send results for our  records --Scheduled for Dermatology with [facility] in October. We will call office to see if appointment can be moved sooner  Sinus bradycardia --Order cardiac MRI  Laryngopharyngeal Reflux --Previously followed by ENT at [facility] with Dr. Jenne Pane --Continue Lansoprazole 30 mg BID  Health Maintenance Immunization History  Administered Date(s) Administered  . Influenza Split 01/14/2014, 12/14/2014, 01/15/2015, 12/11/2015, 11/30/2016, 11/19/2017, 01/01/2019, 12/06/2019  . Influenza Whole 01/13/2009, 12/22/2009  . Influenza-Unspecified 01/21/2011  . PFIZER SARS-COV-2 Vaccination 04/19/2019, 05/10/2019, 12/24/2019  . Pneumococcal Conjugate-13 04/17/2014  . Pneumococcal Polysaccharide-23 04/17/2015  . Td 06/15/2005  . Tdap 09/11/2013  . Zoster 10/14/2014   CT Lung Screen - not qualified. Never smoker  Orders Placed This Encounter  Procedures  . MR CARDIAC MORPHOLOGY W WO CONTRAST    First available    Standing Status:   Future    Standing Expiration Date:   03/25/2021    Order Specific Question:   If indicated for the ordered procedure, I authorize the administration of contrast media per Radiology protocol    Answer:   Yes    Order Specific Question:   What is the patient's sedation requirement?    Answer:   No Sedation    Order Specific Question:   Does the patient have a pacemaker or implanted devices?    Answer:   No    Order Specific Question:   Preferred imaging location?    Answer:   [facility] (table limit - 500 lbs)    Order Specific Question:   Release to patient    Answer:   Immediate  . EKG 12-Lead   No orders of the defined types were placed in this encounter.   Return in about 5 months (around 08/23/2020).  I have spent a total time of 35-minutes on the day of the appointment reviewing prior documentation, coordinating care and discussing medical diagnosis and plan with the patient/family. Imaging, labs and tests included in this note have been  reviewed and interpreted independently by me.  Kamylle Axelson Mechele Collin, MD [place] [facility] 03/25/2020 10:27 AM  Office Number [phone]

## 2020-03-25 NOTE — Patient Instructions (Signed)
Intermittent asthma --CONTINUE Alvesco 80 mcg ONE puff twice a day --USE Albuterol OR Alvesco inhaler as needed for shortness of breath or wheezing  History of sarcoid with eye involvement Mild restrictive defect --Diagnosed by her ophthalmologist >10 years ago --No indication for prednisone therapy --Annual PFTs. Due 01/2021 --Recommend annual ophthalmology exam.  Last visit on 09/2019. Next visit due 09/2020 --EKG showed sinus bradycardia. PR . Normal QT. Will order cardiac MRI --Please request Primary Care to obtain CBC and CMP. Send results for our records --Scheduled for Dermatology with [facility] in October. We will call office to see if appointment can be moved sooner  Sinus bradycardia --Order cardiac MRI  Follow-up in 5 months with me

## 2020-03-26 ENCOUNTER — Other Ambulatory Visit: Payer: Self-pay | Admitting: *Deleted

## 2020-03-26 DIAGNOSIS — D869 Sarcoidosis, unspecified: Secondary | ICD-10-CM

## 2020-03-28 DIAGNOSIS — E7800 Pure hypercholesterolemia, unspecified: Secondary | ICD-10-CM | POA: Diagnosis not present

## 2020-03-28 DIAGNOSIS — D869 Sarcoidosis, unspecified: Secondary | ICD-10-CM | POA: Diagnosis not present

## 2020-04-04 ENCOUNTER — Telehealth (HOSPITAL_COMMUNITY): Payer: Self-pay | Admitting: *Deleted

## 2020-04-04 NOTE — Telephone Encounter (Signed)
Reaching out to patient to offer assistance regarding upcoming cardiac imaging study; pt verbalizes understanding of appt date/time, parking situation and where to check in, and verified current allergies; name and call back number provided for further questions should they arise  Jessica Brick RN Navigator Cardiac Imaging [facility] [phone] office [phone] cell  Pt reports some issues with claustrophobia and my need anti-anxiety medication.

## 2020-04-07 ENCOUNTER — Ambulatory Visit (HOSPITAL_COMMUNITY)
Admission: RE | Admit: 2020-04-07 | Discharge: 2020-04-07 | Disposition: A | Discharge status: Home / Self Care | Payer: Medicare PPO | Source: Ambulatory Visit | Attending: Pulmonary Disease | Admitting: Pulmonary Disease

## 2020-04-07 ENCOUNTER — Other Ambulatory Visit: Payer: Self-pay

## 2020-04-07 DIAGNOSIS — D869 Sarcoidosis, unspecified: Secondary | ICD-10-CM | POA: Insufficient documentation

## 2020-04-07 IMAGING — MR MR CARD MORPHOLOGY WO/W CM
45 of 48 series · 45 of 48 positions shown · IV contrast (gadavist)
Comparison: none

CLINICAL DATA: 69F with sarcoid, evaluate for cardiac involvement

EXAM:
CARDIAC MRI
TECHNIQUE: The patient was scanned on a 1.5 Tesla Siemens magnet. A dedicated
cardiac coil was used. Functional imaging was done using Fiesta
sequences. [DATE], and 4 chamber views were done to assess for RWMA's.
Modified SABO rule using a short axis stack was used to
calculate an ejection fraction on a dedicated work station using
Circle software. The patient received 6 cc of Gadavist. After 10
minutes inversion recovery sequences were used to assess for
infiltration and scar tissue.
CONTRAST:  6 cc  of Gadavist

[Series 4: t2_haste_[id number]_bh · axial · 8.0mm · 1.41mm/px · 1 of 16 slices shown]
[im 1/16]
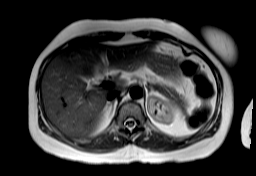

[Series 8: bSSFP · oblique · 8.0mm · 1.52mm/px · 1 of 25 slices shown (1 of 19)]
[im 1/25]
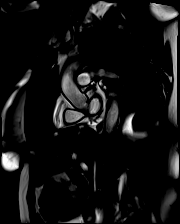

[Series 9: bSSFP · oblique · 8.0mm · 1.52mm/px · 1 of 25 slices shown (2 of 19)]
[im 1/25]
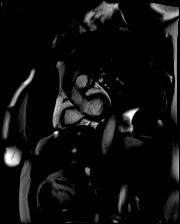

[Series 10: bSSFP · oblique · 8.0mm · 1.52mm/px · 1 of 25 slices shown (3 of 19)]
[im 1/25]
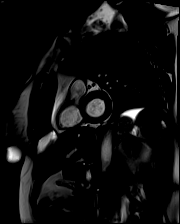

[Series 11: bSSFP · oblique · 8.0mm · 1.52mm/px · 1 of 25 slices shown (4 of 19)]
[im 1/25]
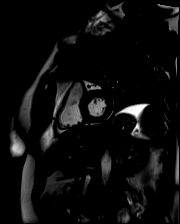

[Series 12: bSSFP · oblique · 8.0mm · 1.52mm/px · 1 of 25 slices shown (5 of 19)]
[im 1/25]
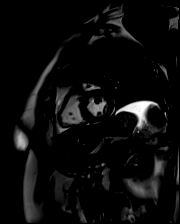

[Series 13: bSSFP · oblique · 8.0mm · 1.52mm/px · 1 of 25 slices shown (6 of 19)]
[im 1/25]
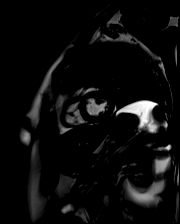

[Series 14: bSSFP · oblique · 8.0mm · 1.52mm/px · 1 of 25 slices shown (7 of 19)]
[im 1/25]
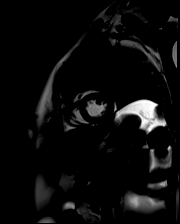

[Series 15: bSSFP · oblique · 8.0mm · 1.52mm/px · 1 of 25 slices shown (8 of 19)]
[im 1/25]
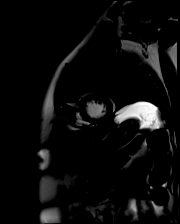

[Series 16: bSSFP · oblique · 8.0mm · 1.52mm/px · 1 of 25 slices shown (9 of 19)]
[im 1/25]
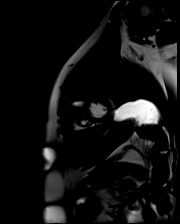

[Series 17: bSSFP · oblique · 8.0mm · 1.52mm/px · 1 of 25 slices shown (10 of 19)]
[im 1/25]
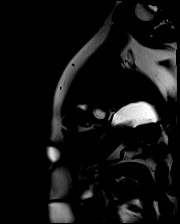

[Series 18: bSSFP · oblique · 8.0mm · 1.52mm/px · 1 of 25 slices shown (11 of 19)]
[im 1/25]
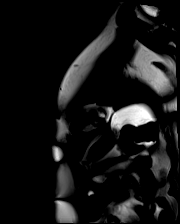

[Series 19: bSSFP · oblique · 8.0mm · 1.52mm/px · 1 of 25 slices shown (12 of 19)]
[im 1/25]
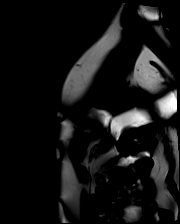

[Series 20: bSSFP · oblique · 8.0mm · 1.52mm/px · 1 of 25 slices shown (13 of 19)]
[im 1/25]
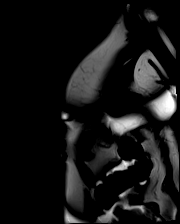

[Series 21: bSSFP · oblique · 6.0mm · 1.41mm/px · 1 of 25 slices shown (14 of 19)]
[im 1/25]
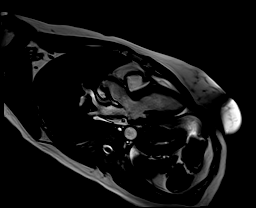

[Series 22: bSSFP · oblique · 6.0mm · 1.41mm/px · 1 of 25 slices shown (15 of 19)]
[im 1/25]
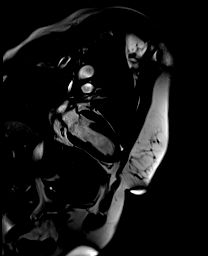

[Series 23: bSSFP · axial · 6.0mm · 1.41mm/px · 1 of 25 slices shown (16 of 19)]
[im 1/25]
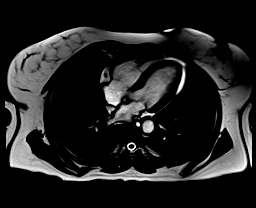

[Series 24: STIR · oblique · 8.0mm · 1.68mm/px · 1 of 15 slices shown]
[im 1/15]
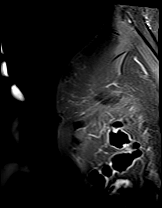

[Series 25: (id)_long_t1 · oblique · 8.0mm · 1.56mm/px · 1 of 24 slices shown]
[im 1/24]
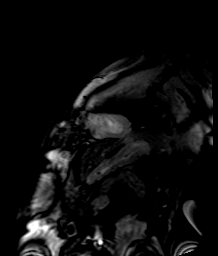

[Series 26: (id)_long_t1_moco · oblique · 8.0mm · 1.56mm/px · 1 of 24 slices shown]
[im 1/24]
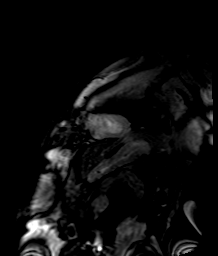

[Series 27: (id)_long_t1_moco_t1 · oblique · 8.0mm · 1.56mm/px · 1 of 6 slices shown]
[im 1/6]
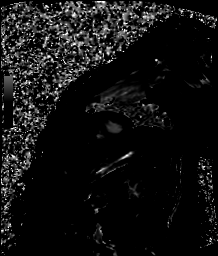

[Series 29: (id)_trufi · oblique · 8.0mm · 2.08mm/px · 1 of 9 slices shown]
[im 1/9]
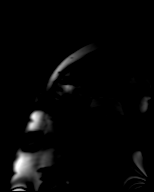

[Series 30: (id)_trufi_moco · oblique · 8.0mm · 2.08mm/px · 1 of 9 slices shown]
[im 1/9]
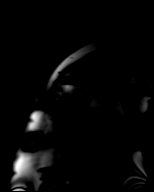

[Series 31: (id)_trufi_moco_t2 · oblique · 8.0mm · 2.08mm/px · 1 of 3 slices shown]
[im 1/3]
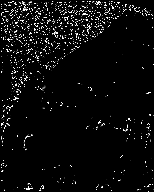

[Series 33: pre short axis · oblique · non-contrast · 8.0mm · 2.25mm/px · 1 of 10 slices shown (1 of 6)]
[im 1/10]
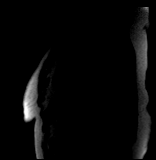

[Series 34: pre short axis · oblique · non-contrast · 8.0mm · 2.25mm/px · 1 of 10 slices shown (2 of 6)]
[im 1/10]
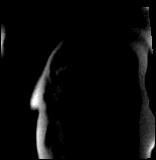

[Series 35: pre short axis · oblique · non-contrast · 8.0mm · 2.25mm/px · 1 of 10 slices shown (3 of 6)]
[im 1/10]
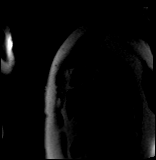

[Series 36: pre short axis · oblique · non-contrast · 8.0mm · 2.25mm/px · 1 of 10 slices shown (4 of 6)]
[im 1/10]
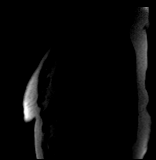

[Series 37: pre short axis · oblique · non-contrast · 8.0mm · 2.25mm/px · 1 of 10 slices shown (5 of 6)]
[im 1/10]
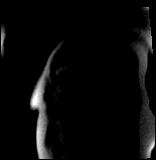

[Series 38: pre short axis · oblique · non-contrast · 8.0mm · 2.25mm/px · 1 of 10 slices shown (6 of 6)]
[im 1/10]
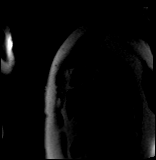

[Series 39: rest short axis · oblique · 8.0mm · 2.25mm/px · 1 of 80 slices shown (1 of 6)]
[im 1/80]
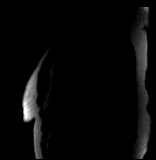

[Series 40: rest short axis · oblique · 8.0mm · 2.25mm/px · 1 of 80 slices shown (2 of 6)]
[im 1/80]
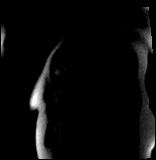

[Series 41: rest short axis · oblique · 8.0mm · 2.25mm/px · 1 of 80 slices shown (3 of 6)]
[im 1/80]
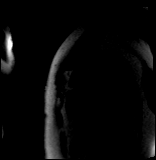

[Series 42: rest short axis · oblique · 8.0mm · 2.25mm/px · 1 of 80 slices shown (4 of 6)]
[im 1/80]
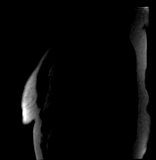

[Series 43: rest short axis · oblique · 8.0mm · 2.25mm/px · 1 of 80 slices shown (5 of 6)]
[im 1/80]
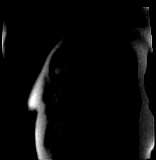

[Series 44: rest short axis · oblique · 8.0mm · 2.25mm/px · 1 of 80 slices shown (6 of 6)]
[im 1/80]
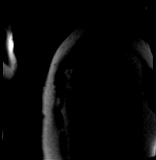

[Series 45: bSSFP · oblique · 8.0mm · 1.83mm/px · 1 of 15 slices shown (17 of 19)]
[im 1/15]
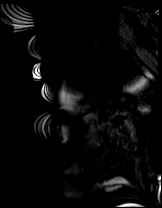

[Series 46: bSSFP · oblique · 8.0mm · 1.83mm/px · 1 of 15 slices shown (18 of 19)]
[im 1/15]
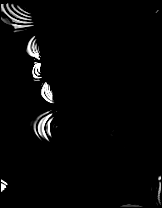

[Series 47: bSSFP · coronal · 6.0mm · 1.41mm/px · 1 of 25 slices shown (19 of 19)]
[im 1/25]
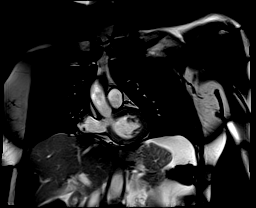

[Series 48: aortic valve cine · oblique · 6.0mm · 1.41mm/px · 1 of 25 slices shown]
[im 1/25]
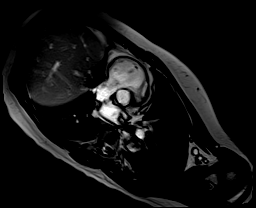

[Series 49: cine rvit · coronal · 6.0mm · 1.41mm/px · 1 of 25 slices shown]
[im 1/25]
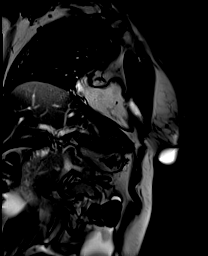

[Series 50: cine rvot · sagittal · 6.0mm · 1.41mm/px · 1 of 25 slices shown (1 of 2)]
[im 1/25]
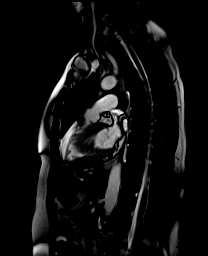

[Series 52: cine rvot · sagittal · 6.0mm · 1.41mm/px · 1 of 25 slices shown (2 of 2)]
[im 1/25]
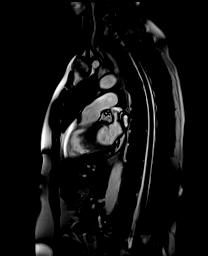

[Series 53: lge_single shot sa · oblique · 8.0mm · 1.88mm/px · 1 of 15 slices shown (1 of 2)]
[im 1/15]
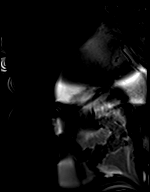

[Series 54: lge_single shot sa · oblique · 8.0mm · 1.88mm/px · 1 of 15 slices shown (2 of 2)]
[im 1/15]
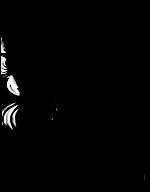

[45 of 48 positions shown; findings below may reference images not displayed]

FINDINGS: Left ventricle:

-Small size

-Hyperdynamic systolic function

-Normal native T1 (981 ms)

-Normal ECV (24%, assuming HCt 40%)

-Normal T2 (46ms)

-Inferior RV insertion site LGE

LV EF:  73% (Normal 56-78%)

Absolute volumes:

LV EDV: 48mL (Normal 52-141 mL)

LV ESV: 13mL (Normal 13-51 mL)

LV SV: 35mL (Normal 33-97 mL)

CO: 2.4L/min (Normal 2.7-6.0 L/min)

Indexed volumes:

LV EDV: 30mL/sq-m (Normal 41-81 mL/sq-m)

LV ESV: 8mL/sq-m (Normal 12-21 mL/sq-m)

LV SV: 22mL/sq-m (Normal 26-56 mL/sq-m)

CI: 1.5L/min/sq-m (Normal 1.8-3.8 L/min/sq-m)

Right ventricle: Small size, normal systolic function

RV EF: 58% (Normal 47-80%)

Absolute volumes:

RV EDV: 49mL (Normal 58-154 mL)

RV ESV: 21mL (Normal 12-68 mL)

RV SV: 29mL (Normal 35-98 mL)

CO: 2.0L/min (Normal 2.7-6 L/min)

Indexed volumes:

RV EDV: 30mL/sq-m (Normal 48-87 mL/sq-m)

RV ESV: 13mL/sq-m (Normal 11-28 mL/sq-m)

RV SV: 17mL/sq-m (Normal 27-57 mL/sq-m)

CI: 1.2L/min/sq-m (Normal 1.8-3.8 L/min/sq-m)

Left atrium: Normal size

Right atrium: Normal size

Mitral valve: No regurgitation

Aortic valve: No regurgitation

Tricuspid valve: No regurgitation

Pulmonic valve: No regurgitation

Aorta: Normal proximal ascending aorta

Pericardium: Small effusion
IMPRESSION: 1.  No evidence of cardiac sarcoid

2.  Small LV size with hyperdynamic systolic function (EF 73%)

3.  Small RV size with normal systolic function (EF 58%)

4. RV insertion site late gadolinium enhancement, which is a
nonspecific finding but can seen in setting of elevated pulmonary
pressures

## 2020-04-07 MED ORDER — GADOBUTROL 1 MMOL/ML IV SOLN
6.0000 mL | Freq: Once | INTRAVENOUS | Status: AC | PRN
  Administered 2020-04-07: 6 mL via INTRAVENOUS

## 2020-05-10 DIAGNOSIS — Z03818 Encounter for observation for suspected exposure to other biological agents ruled out: Secondary | ICD-10-CM | POA: Diagnosis not present

## 2020-05-10 DIAGNOSIS — Z20822 Contact with and (suspected) exposure to covid-19: Secondary | ICD-10-CM | POA: Diagnosis not present

## 2020-07-03 DIAGNOSIS — M21372 Foot drop, left foot: Secondary | ICD-10-CM | POA: Diagnosis not present

## 2020-07-03 DIAGNOSIS — I10 Essential (primary) hypertension: Secondary | ICD-10-CM | POA: Diagnosis not present

## 2020-07-03 DIAGNOSIS — R002 Palpitations: Secondary | ICD-10-CM | POA: Diagnosis not present

## 2020-07-03 DIAGNOSIS — J309 Allergic rhinitis, unspecified: Secondary | ICD-10-CM | POA: Diagnosis not present

## 2020-07-03 DIAGNOSIS — K219 Gastro-esophageal reflux disease without esophagitis: Secondary | ICD-10-CM | POA: Diagnosis not present

## 2020-07-03 DIAGNOSIS — E7800 Pure hypercholesterolemia, unspecified: Secondary | ICD-10-CM | POA: Diagnosis not present

## 2020-07-03 DIAGNOSIS — J45909 Unspecified asthma, uncomplicated: Secondary | ICD-10-CM | POA: Diagnosis not present

## 2020-07-03 DIAGNOSIS — D869 Sarcoidosis, unspecified: Secondary | ICD-10-CM | POA: Diagnosis not present

## 2020-07-03 DIAGNOSIS — E1169 Type 2 diabetes mellitus with other specified complication: Secondary | ICD-10-CM | POA: Diagnosis not present

## 2020-07-04 ENCOUNTER — Encounter: Payer: Self-pay | Admitting: Neurology

## 2020-08-02 DIAGNOSIS — Z20822 Contact with and (suspected) exposure to covid-19: Secondary | ICD-10-CM | POA: Diagnosis not present

## 2020-09-10 NOTE — Progress Notes (Signed)
NEUROLOGY CONSULTATION NOTE  Jessica Woodard MRN: [id number] DOB: [birth year]  Referring provider: Blair Heys, MD Primary care provider: Blair Heys, MD  Reason for consult:  foot drop  Assessment/Plan:   Left lower extremity weakness - she appears to have only weakness in hip flexion, although it is give-way.  No foot drop.  She does not endorse pain or numbness.  No evidence of UMN symptoms.     At this time, I would start with repeat MRI of lumbar spine.  If unremarkable, would then move up the neuro-axis - MRI of spinal cord and then brain if needed. Further recommendations pending results.  Follow up after testing.   Subjective:  Jessica Woodard is a 70 year old right-handed female with type 2 diabetes mellitus, HTN and sarcoidosis who presents for left foot drop. History supplemented by referring provider's note.  She reports difficulty with her left leg since she was a teenager but it has gradually gotten worse over the years.  Around 2004, she was evaluated by orthopedics who couldn't find a cause.  An MRI of the lumbar spine was performed on 05/08/2002 which reportedly showed minimal degenerative disc disease at L3-4 but no significant canal or neural foraminal stenosis at L4-5, L5-S1.  More recently, she has been having falls and has resorted to using an assisted device (walker or cane).  When she used to wear high-heels, she found that it helped.  She says that her leg just drags and the foot gets caught on the floor, causing her to lose balance.  Has some mild musculoskeletal back pain but nothing significant and denies pain and numbness down the leg.  Denies symptoms involving the upper extremities.  She has previously gone to physical therapy which helped, but weakness returned after discontinuation.     PAST MEDICAL HISTORY: Past Medical History:  Diagnosis Date   Allergic rhinitis, cause unspecified    Allergy    seasonal    Anxiety    sleep walking/night terrors    Asthma    inhalers   BRONCHITIS, ACUTE 04/29/2009   Qualifier: Diagnosis of  By: Maple Hudson MD, Clinton D    GERD (gastroesophageal reflux disease)    Hypertension    Sarcoid     PAST SURGICAL HISTORY: Past Surgical History:  Procedure Laterality Date   DEBRIDEMENT TOE     corns removed   EXTERNAL FIXATION OF FINGER     TONSILLECTOMY      MEDICATIONS: Current Outpatient Medications on File Prior to Visit  Medication Sig Dispense Refill   albuterol (VENTOLIN HFA) 108 (90 Base) MCG/ACT inhaler Inhale 2 puffs into the lungs every 6 (six) hours as needed for wheezing or shortness of breath. 1 each 3   APPLE CIDER VINEGAR PO See admin instructions.     aspirin 81 MG tablet Take 81 mg by mouth daily.     ciclesonide (ALVESCO) 80 MCG/ACT inhaler Inhale 1 puff into the lungs 2 (two) times daily. 1 each 0   lansoprazole (PREVACID) 30 MG capsule Take 30 mg by mouth 2 (two) times daily before a meal.     metFORMIN (GLUCOPHAGE-XR) 500 MG 24 hr tablet      metoprolol (TOPROL-XL) 50 MG 24 hr tablet Take 75 mg by mouth daily.     montelukast (SINGULAIR) 10 MG tablet Take 10 mg by mouth at bedtime.     Omega-3 Fatty Acids (FISH OIL) 1200 MG CAPS Take 1 capsule by mouth daily.     OneTouch  Delica Lancets 33G MISC      Propylene Glycol 0.6 % SOLN Apply to eye.     rosuvastatin (CRESTOR) 5 MG tablet      spironolactone (ALDACTONE) 25 MG tablet      telmisartan (MICARDIS) 40 MG tablet TAKE 1 TABLET BY MOUTH EVERY DAY FOR BLOOD PRESSURE     valACYclovir (VALTREX) 500 MG tablet Take 1 tablet by mouth Daily.     Current Facility-Administered Medications on File Prior to Visit  Medication Dose Route Frequency Provider Last Rate Last Admin   0.9 %  sodium chloride infusion  500 mL Intravenous Continuous Danis, Starr Lake III, MD        ALLERGIES: Allergies  Allergen Reactions   Sulfa Antibiotics Rash   Cefdinir     REACTION: diarrhea   Other Itching   Roflumilast Diarrhea   Sulfonamide Derivatives  Rash    FAMILY HISTORY: Family History  Problem Relation Age of Onset   Asthma Mother    Colon polyps Mother    Diabetes Mother    Heart disease Mother    Stroke Mother    Kidney disease Mother    Asthma Maternal Grandmother    Asthma Maternal Grandfather    Asthma Paternal Grandfather    Asthma Paternal Grandmother    Asthma Sister    Breast cancer Sister    Diabetes Sister    Asthma Brother    Colon polyps Brother    Heart disease Brother    Colon cancer Neg Hx     Objective:  .Blood pressure 110/80, pulse 76, height 5\' 1"  (1.549 m), weight 132 lb (59.9 kg), SpO2 98 %. General: No acute distress.  Patient appears well-groomed.   Head:  Normocephalic/atraumatic Eyes:  fundi examined but not visualized Neck: supple, no paraspinal tenderness, full range of motion Back: No paraspinal tenderness Heart: regular rate and rhythm Lungs: Clear to auscultation bilaterally. Vascular: No carotid bruits. Neurological Exam: Mental status: alert and oriented to person, place, and time, recent and remote memory intact, fund of knowledge intact, attention and concentration intact, speech fluent and not dysarthric, language intact. Cranial nerves: CN I: not tested CN II: pupils equal, round and reactive to light, visual fields intact CN III, IV, VI:  full range of motion, no nystagmus, no ptosis CN V: facial sensation intact. CN VII: upper and lower face symmetric CN VIII: hearing intact CN IX, X: gag intact, uvula midline CN XI: sternocleidomastoid and trapezius muscles intact CN XII: tongue midline Bulk & Tone: normal, no fasciculations. Motor:  muscle strength demonstrated some give way weakness in left hip flexion but other muscle groups of left lower extremity (hip abduction/adduction, knee flexion/extension, ankle dorsiflexion/plantar flexion, foot inversion/eversion and EHL) were intact.  All other muscle groups were intact. Sensation:  Pinprick, temperature and vibratory  sensation intact. Deep Tendon Reflexes:  2+ throughout,  toes downgoing.   Finger to nose testing:  Without dysmetria.   Heel to shin:  Without dysmetria.   Gait:  She does appear to drag the left leg.  Able to walk on toes but some difficulty walking on heel.  Tandem gait unsteady  Romberg negative.   , DO  CC: Shon Millet, MD

## 2020-09-11 ENCOUNTER — Other Ambulatory Visit: Payer: Self-pay

## 2020-09-11 ENCOUNTER — Ambulatory Visit: Payer: Medicare PPO | Admitting: Neurology

## 2020-09-11 ENCOUNTER — Encounter: Payer: Self-pay | Admitting: Neurology

## 2020-09-11 VITALS — BP 110/80 | HR 76 | Ht 61.0 in | Wt 132.0 lb

## 2020-09-11 DIAGNOSIS — R29898 Other symptoms and signs involving the musculoskeletal system: Secondary | ICD-10-CM

## 2020-09-11 DIAGNOSIS — R296 Repeated falls: Secondary | ICD-10-CM | POA: Diagnosis not present

## 2020-09-11 NOTE — Patient Instructions (Addendum)
We will check MRI of lumbar spine with and without contrast.  If unremarkable, will check the spinal cord and then brain if necessary.  Further recommendations pending results.  Follow up after testing.   We have sent a referral to [facility] for your MRI and they will call you directly to schedule your appointment. They are located at [address] [facility]. If you need to contact them directly please call [phone].

## 2020-09-24 DIAGNOSIS — L819 Disorder of pigmentation, unspecified: Secondary | ICD-10-CM | POA: Diagnosis not present

## 2020-09-24 DIAGNOSIS — R208 Other disturbances of skin sensation: Secondary | ICD-10-CM | POA: Diagnosis not present

## 2020-10-03 ENCOUNTER — Ambulatory Visit
Admission: RE | Admit: 2020-10-03 | Discharge: 2020-10-03 | Disposition: A | Discharge status: Home / Self Care | Payer: Medicare PPO | Source: Ambulatory Visit | Attending: Neurology | Admitting: Neurology

## 2020-10-03 ENCOUNTER — Other Ambulatory Visit: Payer: Self-pay

## 2020-10-03 DIAGNOSIS — M5126 Other intervertebral disc displacement, lumbar region: Secondary | ICD-10-CM | POA: Diagnosis not present

## 2020-10-03 DIAGNOSIS — M47816 Spondylosis without myelopathy or radiculopathy, lumbar region: Secondary | ICD-10-CM | POA: Diagnosis not present

## 2020-10-03 DIAGNOSIS — R531 Weakness: Secondary | ICD-10-CM | POA: Diagnosis not present

## 2020-10-03 DIAGNOSIS — R296 Repeated falls: Secondary | ICD-10-CM | POA: Diagnosis not present

## 2020-10-03 IMAGING — MR MR LUMBAR SPINE WO/W CM
4 of 7 series · 25 of 48 positions shown · IV contrast (multihance)
Comparison: None.

CLINICAL DATA: Left foot drop. Left lower extremity weakness.
Frequent falls.

EXAM:
MRI LUMBAR SPINE WITHOUT AND WITH CONTRAST
TECHNIQUE: Multiplanar and multiecho pulse sequences of the lumbar spine were
obtained without and with intravenous contrast.
CONTRAST:  12mL MULTIHANCE GADOBENATE DIMEGLUMINE 529 MG/ML IV SOLN

[Series 5: T1 · sagittal · 4.0mm · 0.53mm/px · 5 of 16 slices shown (1 of 2)]
[im 1/16]
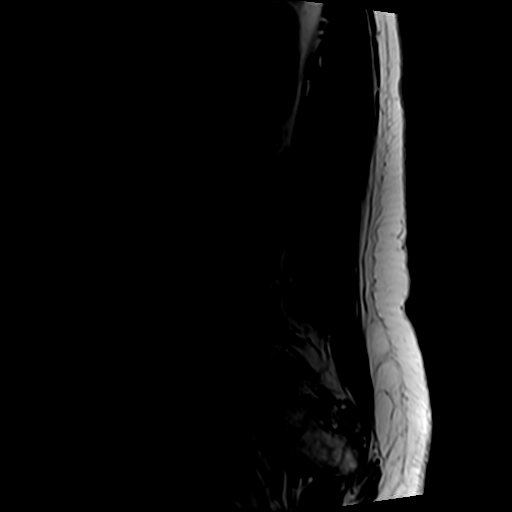
[im 4/16]
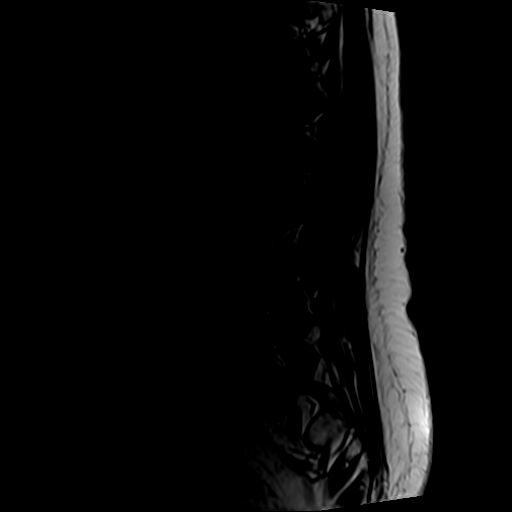
[im 8/16]
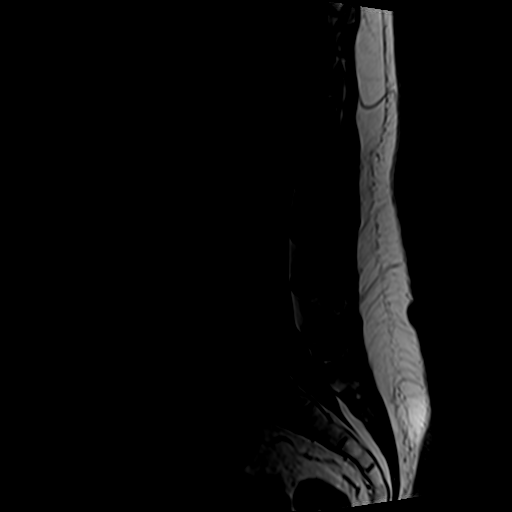
[im 12/16]
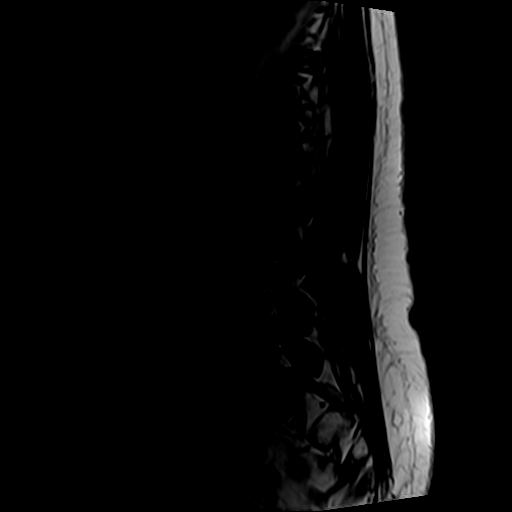
[im 16/16]
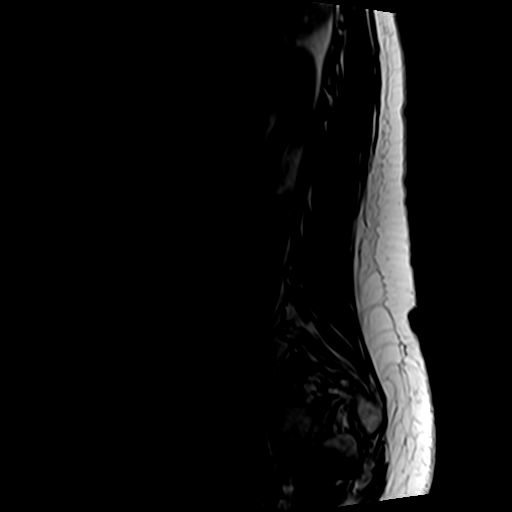

[Series 6: T2 · axial · 4.0mm · 0.70mm/px · z∈[+57,+249]mm · 8 of 37 slices shown]
[im 1/37]
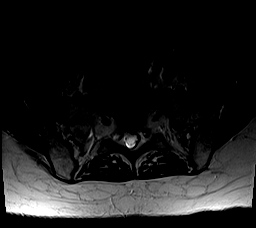
[im 5/37]
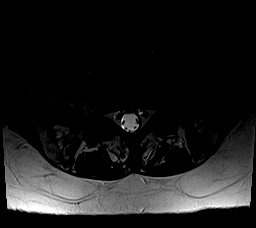
[im 13/37]
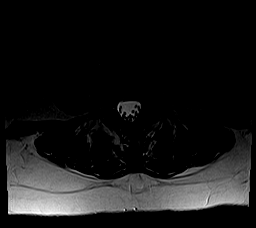
[im 17/37]
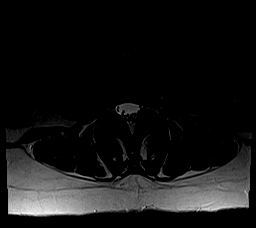
[im 21/37]
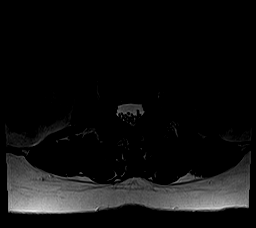
[im 25/37]
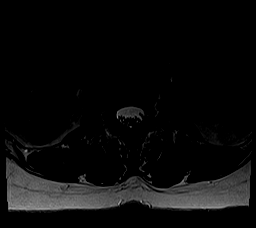
[im 33/37]
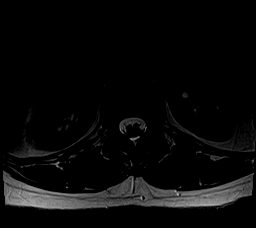
[im 37/37]
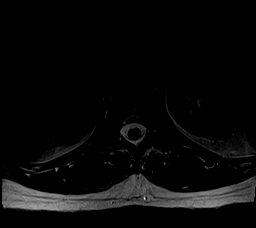

[Series 7: T1 · axial · 4.0mm · 0.35mm/px · z∈[+57,+249]mm · 8 of 37 slices shown (2 of 2)]
[im 1/37]
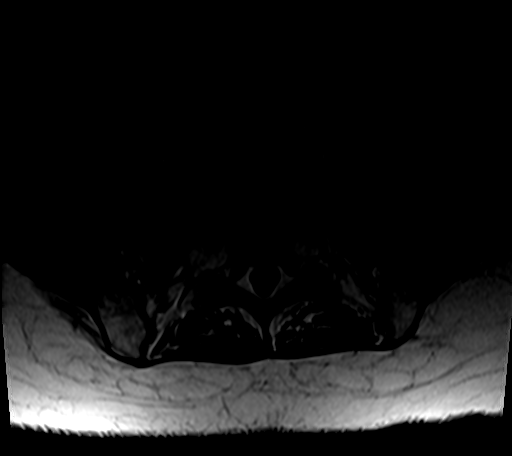
[im 5/37]
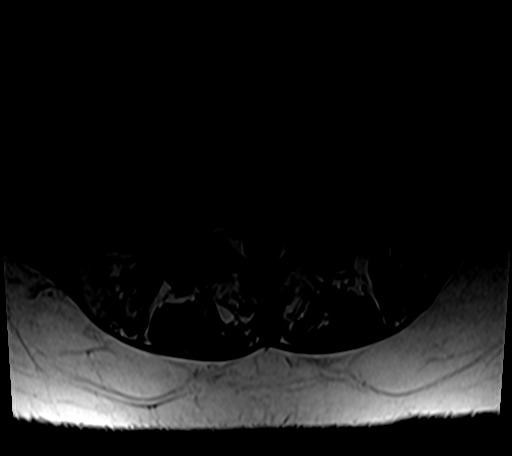
[im 13/37]
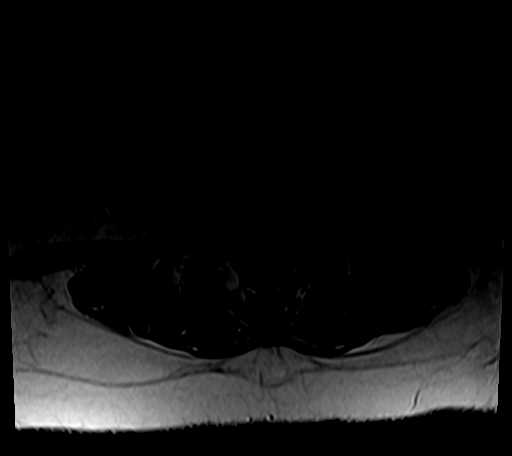
[im 17/37]
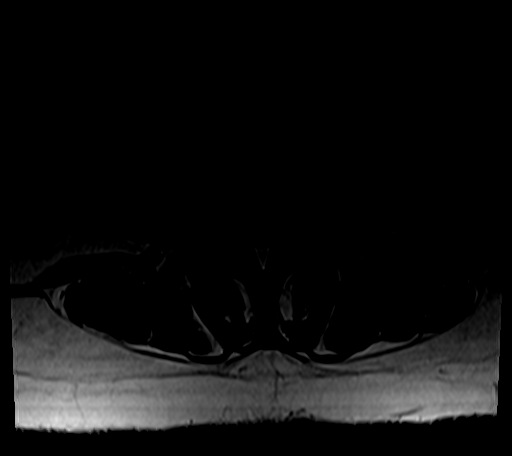
[im 21/37]
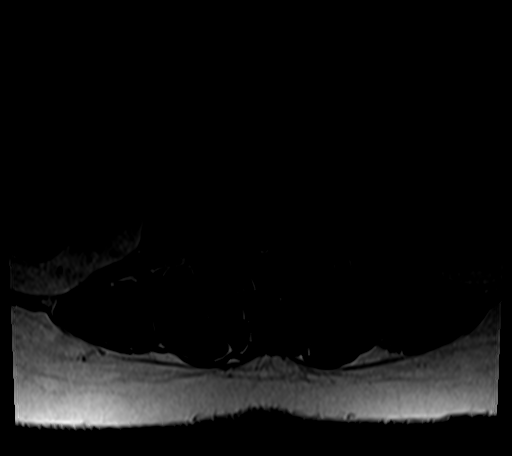
[im 25/37]
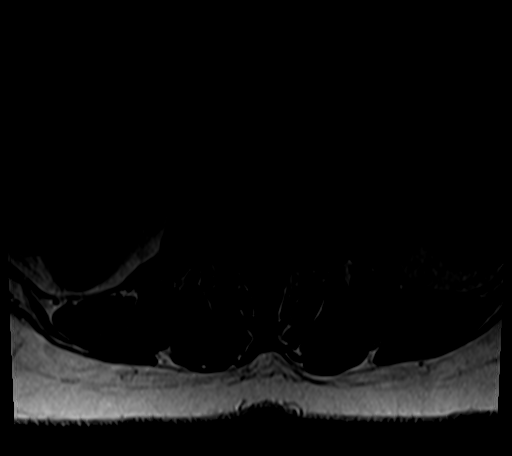
[im 33/37]
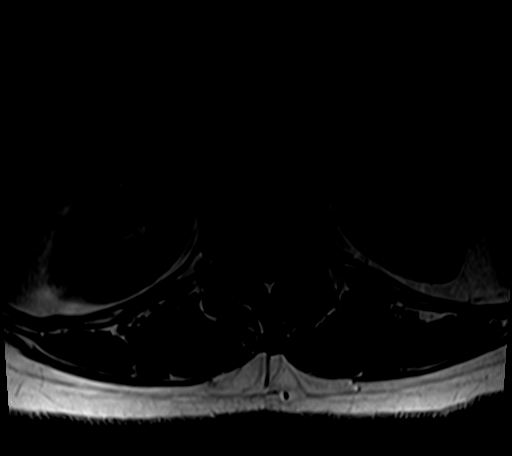
[im 37/37]
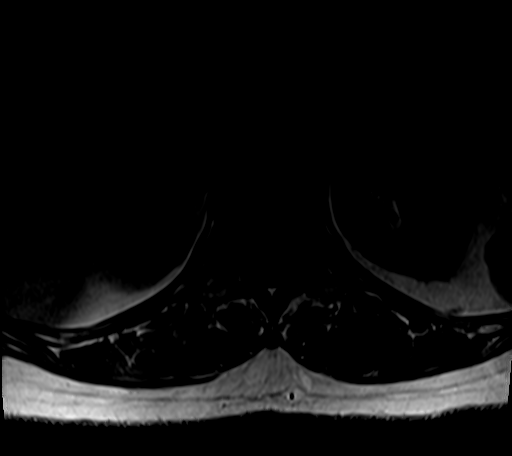

[Series 8: T2 post-contrast · sagittal · 4.0mm · 0.53mm/px · 4 of 16 slices shown]
[im 1/16]
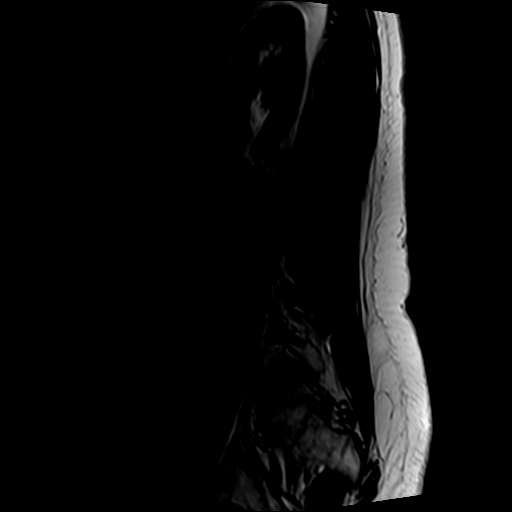
[im 6/16]
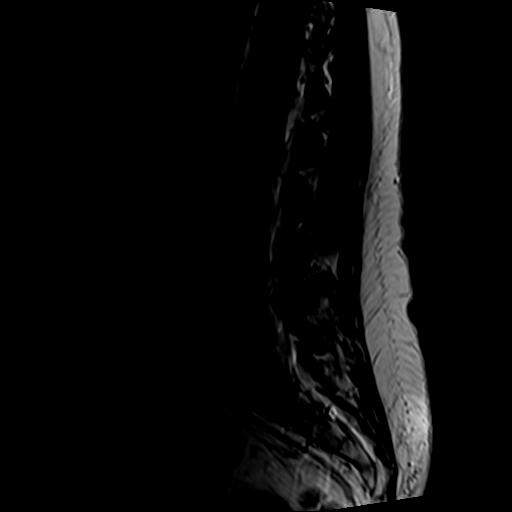
[im 11/16]
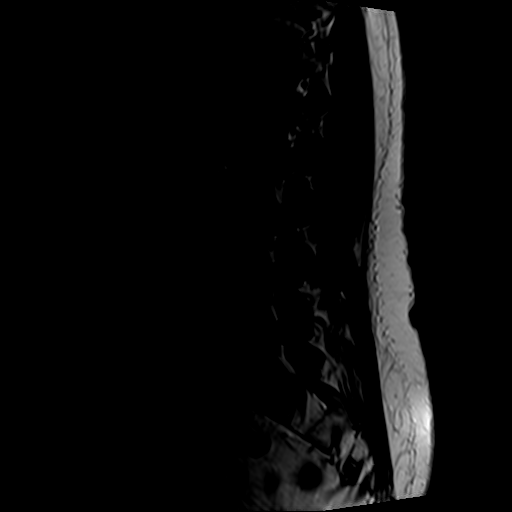
[im 16/16]
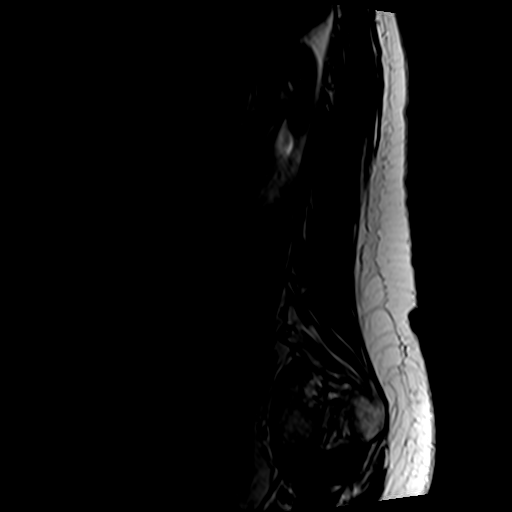

[25 of 48 positions shown; findings below may reference images not displayed]

FINDINGS: Segmentation: 5 non rib-bearing lumbar type vertebral bodies are
present. The lowest fully formed vertebral body is L5.

Alignment:  No significant listhesis is present.

Vertebrae: Marrow signal and vertebral body heights are normal. No
pathologic enhancement is present.

Conus medullaris and cauda equina: Conus extends to the L2 level.
Conus and cauda equina appear normal.

Paraspinal and other soft tissues: Limited imaging the abdomen is
unremarkable. There is no significant adenopathy. No solid organ
lesions are present.

Disc levels:

L1-2: Negative.

L2-3: Mild facet hypertrophy is present bilaterally. Mild disc
bulging noted without significant stenosis.

L3-4: Mild disc bulging desiccation present. No focal protrusion or
stenosis.

L4-5: Mild facet hypertrophy is present bilaterally. No significant
disc protrusion or stenosis is present.

L5-S1: Mild facet hypertrophy is present bilaterally. No significant
disc protrusion or stenosis is present.
IMPRESSION: 1. Mild multilevel spondylosis of the lumbar spine as described. No
significant disc protrusion or stenosis is present.
2. Mild facet hypertrophy at L2-3, L3-4, and L5-S1 without
significant stenosis.

## 2020-10-03 MED ORDER — GADOBENATE DIMEGLUMINE 529 MG/ML IV SOLN
12.0000 mL | Freq: Once | INTRAVENOUS | Status: AC | PRN
  Administered 2020-10-03: 12 mL via INTRAVENOUS

## 2020-10-04 ENCOUNTER — Other Ambulatory Visit: Payer: Medicare PPO

## 2020-10-06 DIAGNOSIS — H2513 Age-related nuclear cataract, bilateral: Secondary | ICD-10-CM | POA: Diagnosis not present

## 2020-10-06 DIAGNOSIS — H15111 Episcleritis periodica fugax, right eye: Secondary | ICD-10-CM | POA: Diagnosis not present

## 2020-10-06 DIAGNOSIS — H0102B Squamous blepharitis left eye, upper and lower eyelids: Secondary | ICD-10-CM | POA: Diagnosis not present

## 2020-10-06 DIAGNOSIS — H0102A Squamous blepharitis right eye, upper and lower eyelids: Secondary | ICD-10-CM | POA: Diagnosis not present

## 2020-10-07 ENCOUNTER — Telehealth: Payer: Self-pay

## 2020-10-07 DIAGNOSIS — R29898 Other symptoms and signs involving the musculoskeletal system: Secondary | ICD-10-CM

## 2020-10-07 DIAGNOSIS — R296 Repeated falls: Secondary | ICD-10-CM

## 2020-10-07 DIAGNOSIS — G1220 Motor neuron disease, unspecified: Secondary | ICD-10-CM

## 2020-10-07 NOTE — Telephone Encounter (Signed)
-----   Message from Drema Dallas, DO sent at 10/07/2020 12:23 PM EDT ----- MRI does not reveal any cause for left leg weakness.  Would now like to check MRI of cervical and thoracic spine without contrast for falls and left leg weakness.

## 2020-10-07 NOTE — Telephone Encounter (Signed)
Pt advised of her MRI lumbar spine,Dr.Jaffe wants to order Mri cervical spine and thoracic spine.    Pt agrees. Orders added.

## 2020-10-13 DIAGNOSIS — H2513 Age-related nuclear cataract, bilateral: Secondary | ICD-10-CM | POA: Diagnosis not present

## 2020-10-13 DIAGNOSIS — H0102B Squamous blepharitis left eye, upper and lower eyelids: Secondary | ICD-10-CM | POA: Diagnosis not present

## 2020-10-13 DIAGNOSIS — H0102A Squamous blepharitis right eye, upper and lower eyelids: Secondary | ICD-10-CM | POA: Diagnosis not present

## 2020-10-13 DIAGNOSIS — H15111 Episcleritis periodica fugax, right eye: Secondary | ICD-10-CM | POA: Diagnosis not present

## 2020-10-15 DIAGNOSIS — Z803 Family history of malignant neoplasm of breast: Secondary | ICD-10-CM | POA: Diagnosis not present

## 2020-10-15 DIAGNOSIS — Z1231 Encounter for screening mammogram for malignant neoplasm of breast: Secondary | ICD-10-CM | POA: Diagnosis not present

## 2020-10-15 DIAGNOSIS — Z780 Asymptomatic menopausal state: Secondary | ICD-10-CM | POA: Diagnosis not present

## 2020-10-15 DIAGNOSIS — M85851 Other specified disorders of bone density and structure, right thigh: Secondary | ICD-10-CM | POA: Diagnosis not present

## 2020-10-29 ENCOUNTER — Ambulatory Visit
Admission: RE | Admit: 2020-10-29 | Discharge: 2020-10-29 | Disposition: A | Discharge status: Home / Self Care | Payer: Medicare PPO | Source: Ambulatory Visit | Attending: Neurology | Admitting: Neurology

## 2020-10-29 ENCOUNTER — Other Ambulatory Visit: Payer: Self-pay

## 2020-10-29 DIAGNOSIS — G1220 Motor neuron disease, unspecified: Secondary | ICD-10-CM

## 2020-10-29 DIAGNOSIS — M50222 Other cervical disc displacement at C5-C6 level: Secondary | ICD-10-CM | POA: Diagnosis not present

## 2020-10-29 DIAGNOSIS — R296 Repeated falls: Secondary | ICD-10-CM

## 2020-10-29 DIAGNOSIS — R29898 Other symptoms and signs involving the musculoskeletal system: Secondary | ICD-10-CM

## 2020-10-29 DIAGNOSIS — G9589 Other specified diseases of spinal cord: Secondary | ICD-10-CM | POA: Diagnosis not present

## 2020-10-29 DIAGNOSIS — R262 Difficulty in walking, not elsewhere classified: Secondary | ICD-10-CM | POA: Diagnosis not present

## 2020-10-29 DIAGNOSIS — M5124 Other intervertebral disc displacement, thoracic region: Secondary | ICD-10-CM | POA: Diagnosis not present

## 2020-10-29 IMAGING — MR MR CERVICAL SPINE W/O CM
5 series · 29 of 48 positions shown · non-contrast
Comparison: None.

CLINICAL DATA: Chronic left leg pain and difficulty walking. No
injury or prior surgery.

EXAM:
MRI CERVICAL SPINE WITHOUT CONTRAST
TECHNIQUE: Multiplanar, multisequence MR imaging of the cervical spine was
performed. No intravenous contrast was administered.

[Series 5: T2 · sagittal · 3.0mm · 0.55mm/px · 7 of 15 slices shown (1 of 2)]
[im 1/15]
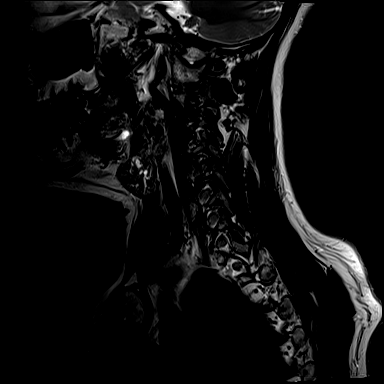
[im 3/15]
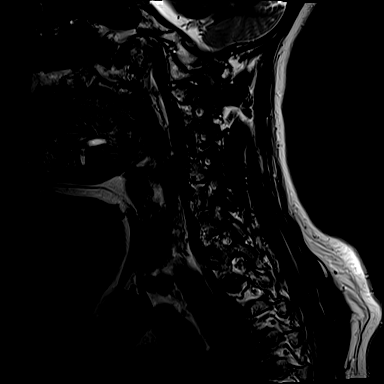
[im 5/15]
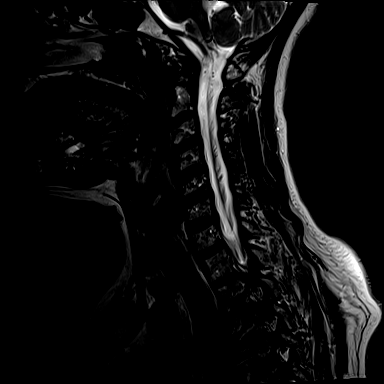
[im 8/15]
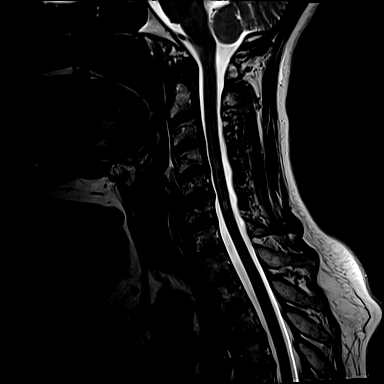
[im 10/15]
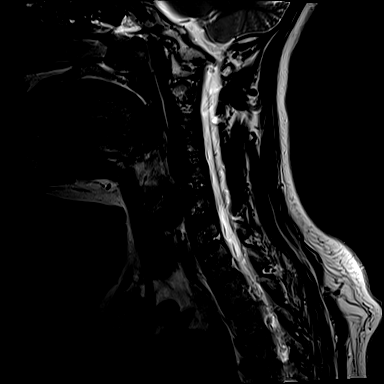
[im 12/15]
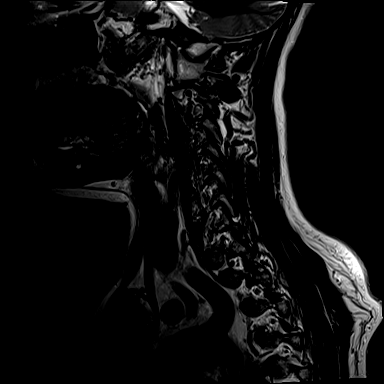
[im 15/15]
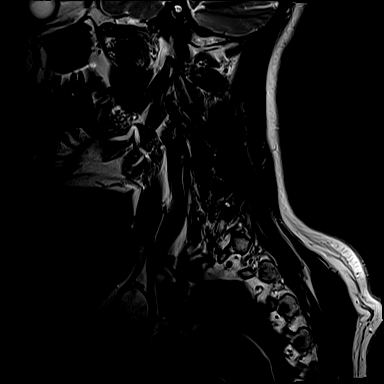

[Series 6: T1 · sagittal · 3.0mm · 0.66mm/px · 7 of 15 slices shown]
[im 1/15]
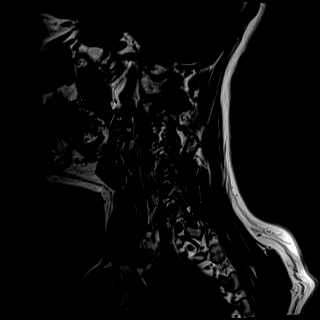
[im 3/15]
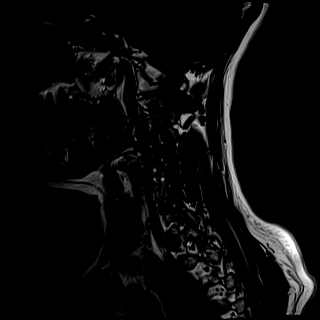
[im 5/15]
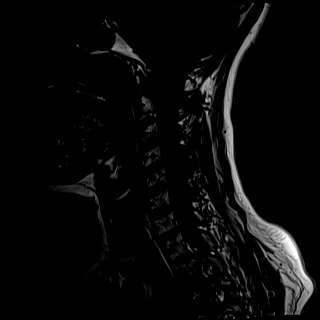
[im 8/15]
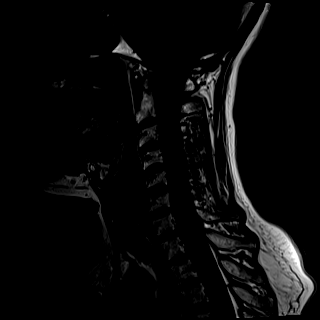
[im 10/15]
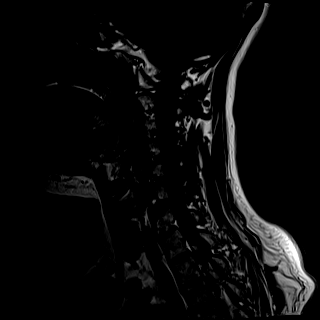
[im 12/15]
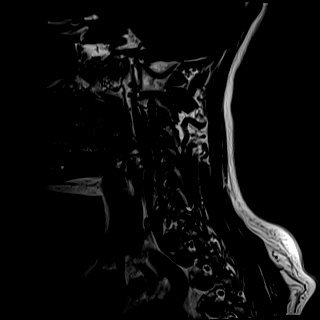
[im 15/15]
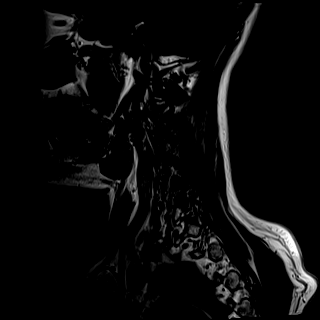

[Series 7: STIR · sagittal · 3.0mm · 0.33mm/px · 6 of 15 slices shown]
[im 1/15]
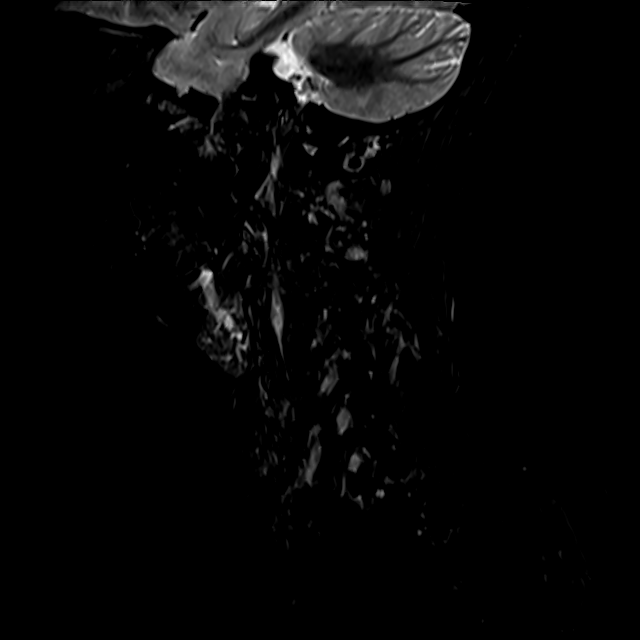
[im 3/15]
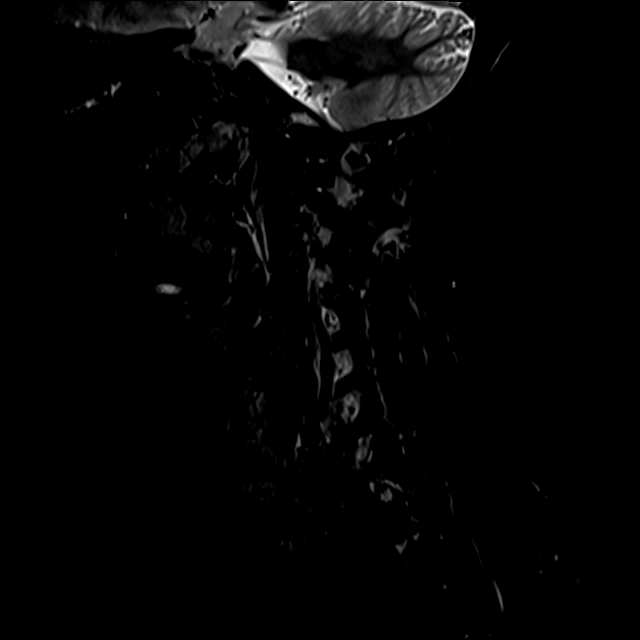
[im 6/15]
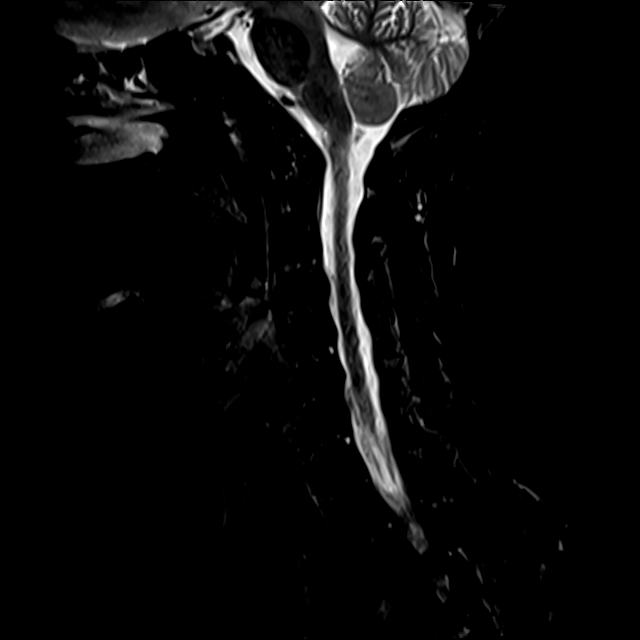
[im 9/15]
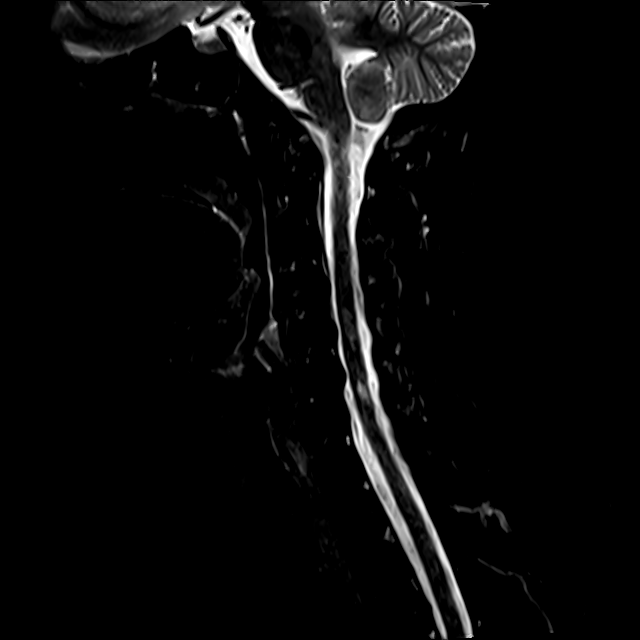
[im 12/15]
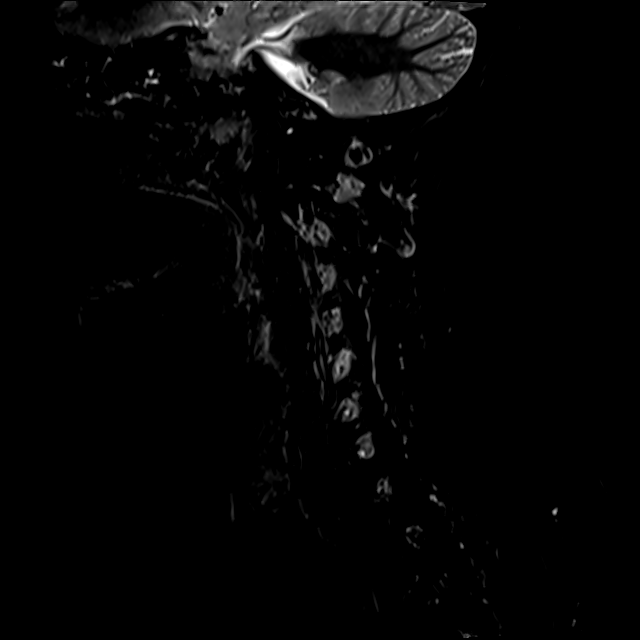
[im 15/15]
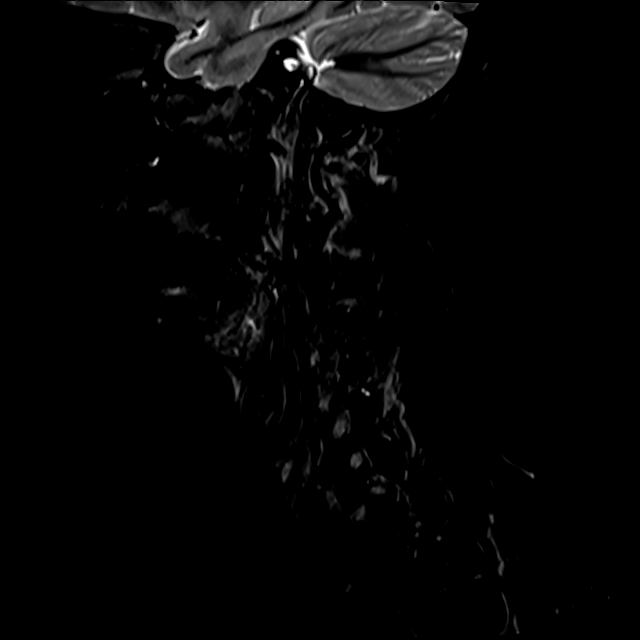

[Series 8: T2 · axial · 3.0mm · 0.50mm/px · z∈[-62,+43]mm · 8 of 33 slices shown (2 of 2)]
[im 1/33]
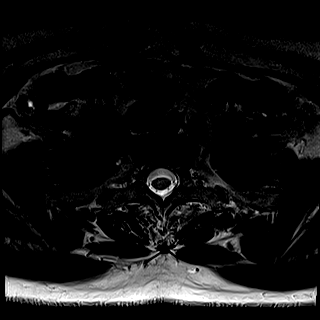
[im 5/33]
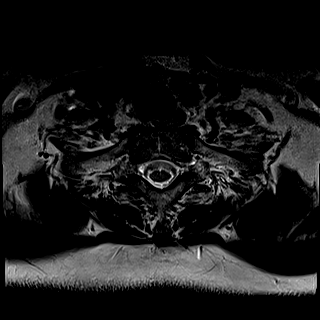
[im 10/33]
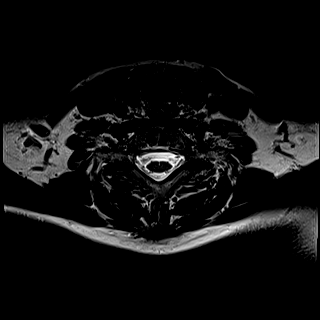
[im 15/33]
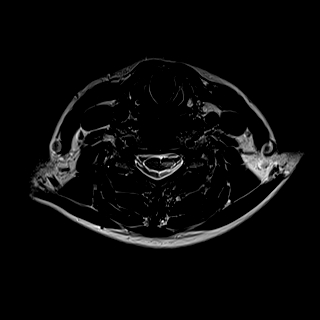
[im 18/33]
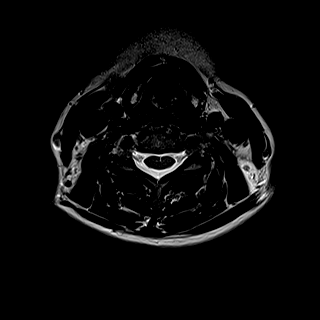
[im 23/33]
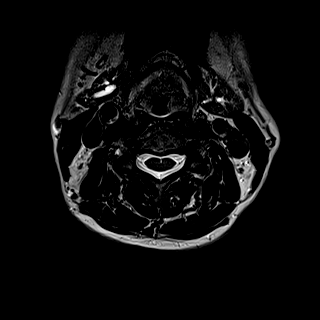
[im 28/33]
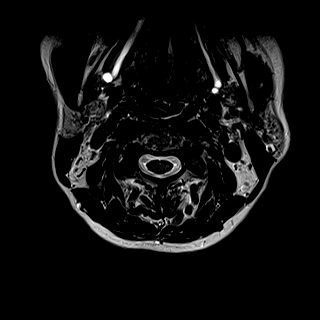
[im 33/33]
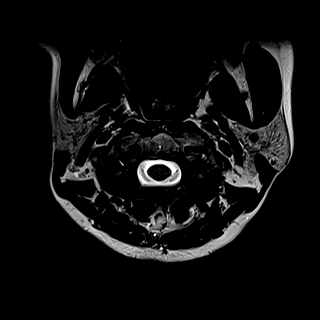

[Series 9: GRE · axial · 3.0mm · 0.42mm/px · 1 of 33 slices shown]
[im 1/33]
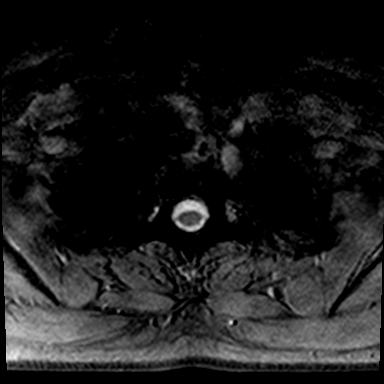

[29 of 48 positions shown; findings below may reference images not displayed]

FINDINGS: Alignment: Physiologic.

Vertebrae: No fracture, evidence of discitis, or bone lesion.

Cord: Focal increased T2 signal in the left hemicord at C6 with
associated slight cord atrophy (series 8, image 19).

Posterior Fossa, vertebral arteries, paraspinal tissues: Negative.

Disc levels:

No significant disc bulge or herniation in the cervical spine. No
stenosis.

Small disc protrusions at T3-T4 and T4-T5.
IMPRESSION: 1. Focal solitary increased T2 signal in the left hemicord at C6
with associated slight cord atrophy. Differential considerations
include neurodegenerative disease and demyelinating disease. The

## 2020-11-03 ENCOUNTER — Telehealth: Payer: Self-pay

## 2020-11-03 DIAGNOSIS — R29898 Other symptoms and signs involving the musculoskeletal system: Secondary | ICD-10-CM

## 2020-11-03 DIAGNOSIS — G1220 Motor neuron disease, unspecified: Secondary | ICD-10-CM

## 2020-11-03 DIAGNOSIS — R296 Repeated falls: Secondary | ICD-10-CM

## 2020-11-03 NOTE — Telephone Encounter (Signed)
-----   Message from Drema Dallas, DO sent at 10/30/2020  4:57 PM EDT ----- I called and spoke with Ms. Cabeza about the MRI results.  I would like to repeat MRI of the cervical spine WITH contrast.  I would also like to check an MRI of the brain and thoracic spine with and without contrast as well.

## 2020-11-03 NOTE — Telephone Encounter (Signed)
Per Dr.Jaffe MRI Brain,Cervical And thoracic spine W/Wo contrast ordered.

## 2020-11-11 DIAGNOSIS — Z20822 Contact with and (suspected) exposure to covid-19: Secondary | ICD-10-CM | POA: Diagnosis not present

## 2020-11-23 DIAGNOSIS — Z20822 Contact with and (suspected) exposure to covid-19: Secondary | ICD-10-CM | POA: Diagnosis not present

## 2020-11-25 DIAGNOSIS — A084 Viral intestinal infection, unspecified: Secondary | ICD-10-CM | POA: Diagnosis not present

## 2020-11-25 DIAGNOSIS — U071 COVID-19: Secondary | ICD-10-CM | POA: Diagnosis not present

## 2020-12-01 ENCOUNTER — Other Ambulatory Visit: Payer: Medicare PPO

## 2020-12-02 DIAGNOSIS — R5382 Chronic fatigue, unspecified: Secondary | ICD-10-CM | POA: Diagnosis not present

## 2020-12-02 DIAGNOSIS — U099 Post covid-19 condition, unspecified: Secondary | ICD-10-CM | POA: Diagnosis not present

## 2020-12-02 DIAGNOSIS — U071 COVID-19: Secondary | ICD-10-CM | POA: Diagnosis not present

## 2020-12-06 ENCOUNTER — Ambulatory Visit
Admission: RE | Admit: 2020-12-06 | Discharge: 2020-12-06 | Disposition: A | Discharge status: Home / Self Care | Payer: Medicare PPO | Source: Ambulatory Visit | Attending: Neurology | Admitting: Neurology

## 2020-12-06 DIAGNOSIS — R296 Repeated falls: Secondary | ICD-10-CM

## 2020-12-06 DIAGNOSIS — G1220 Motor neuron disease, unspecified: Secondary | ICD-10-CM

## 2020-12-06 DIAGNOSIS — R29898 Other symptoms and signs involving the musculoskeletal system: Secondary | ICD-10-CM

## 2020-12-06 DIAGNOSIS — M5134 Other intervertebral disc degeneration, thoracic region: Secondary | ICD-10-CM | POA: Diagnosis not present

## 2020-12-06 IMAGING — MR MR THORACIC SPINE WO/W CM
5 of 10 series · 24 of 48 positions shown · IV contrast (multihance)
Comparison: None.

CLINICAL DATA: Motor neuron disease. Left leg weakness. Frequent
falls.

EXAM:
MRI THORACIC WITHOUT AND WITH CONTRAST
TECHNIQUE: Multiplanar and multiecho pulse sequences of the thoracic spine were
obtained without and with intravenous contrast.
CONTRAST:  13mL MULTIHANCE GADOBENATE DIMEGLUMINE 529 MG/ML IV SOLN

[Series 17: T1 · sagittal · 3.0mm · 0.83mm/px · 2 of 18 slices shown (1 of 2)]
[im 1/18]
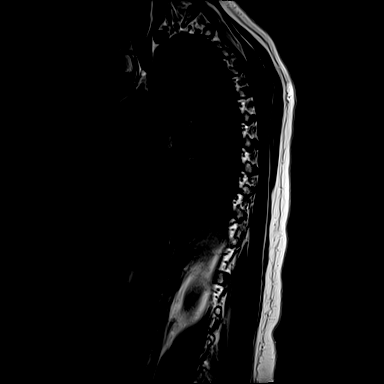
[im 18/18]
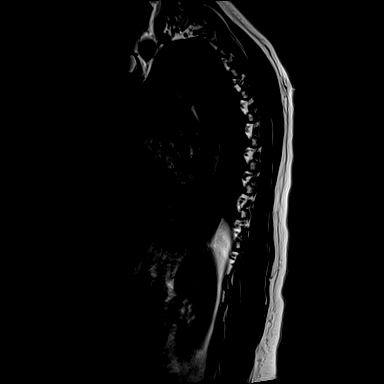

[Series 19: T2 · sagittal · 3.0mm · 0.83mm/px · 3 of 18 slices shown (1 of 3)]
[im 1/18]
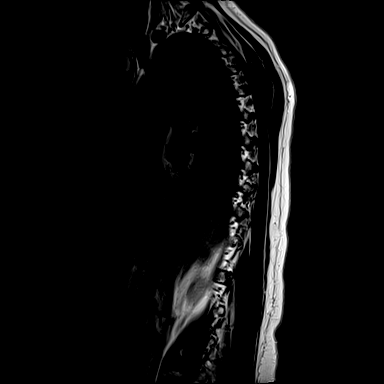
[im 9/18]
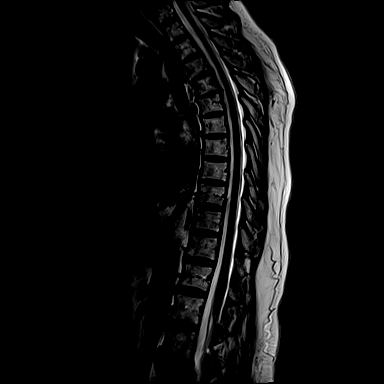
[im 18/18]
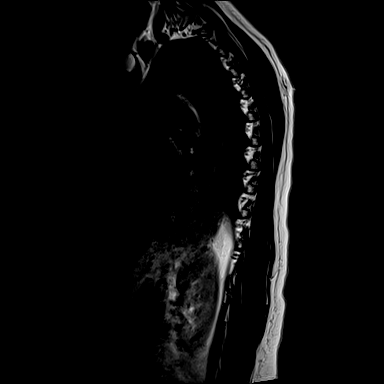

[Series 20: T2 · axial · 4.0mm · 0.28mm/px · z∈[-304,-48]mm · 8 of 54 slices shown (2 of 3)]
[im 1/54]
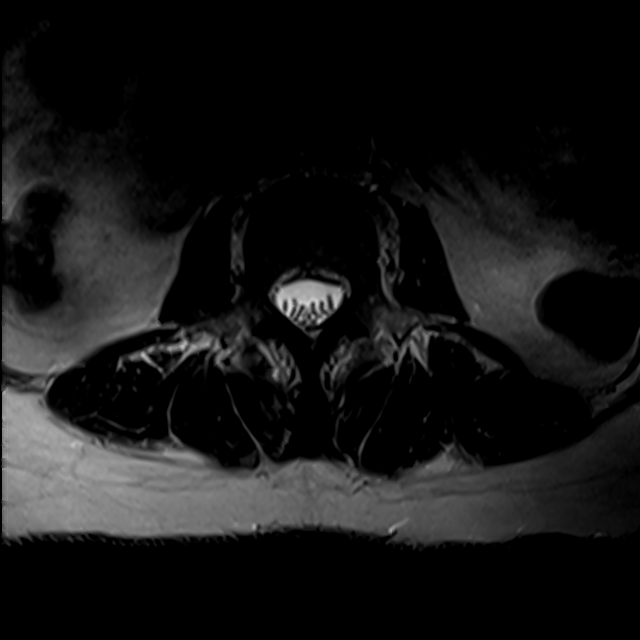
[im 8/54]
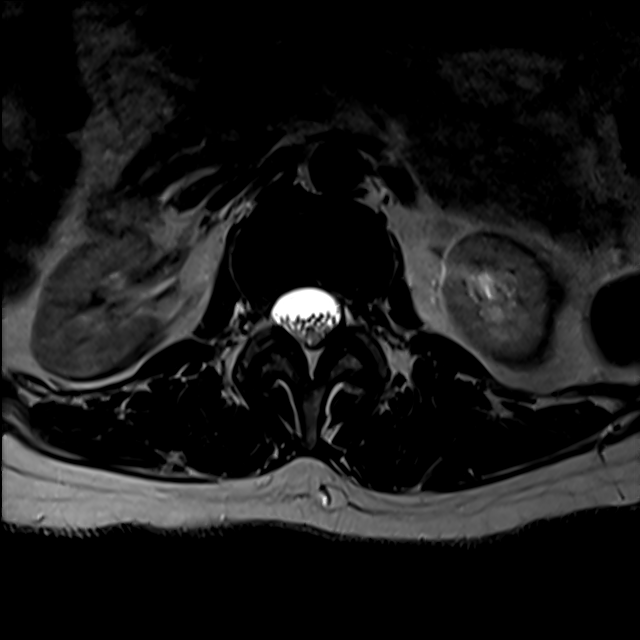
[im 16/54]
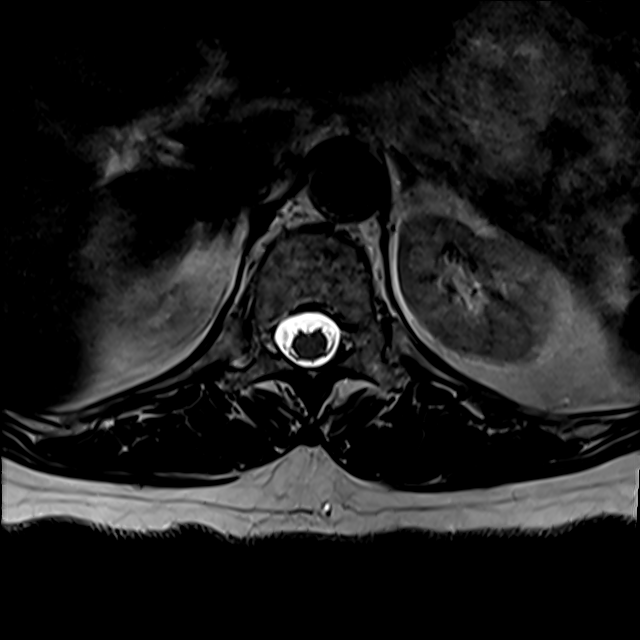
[im 23/54]
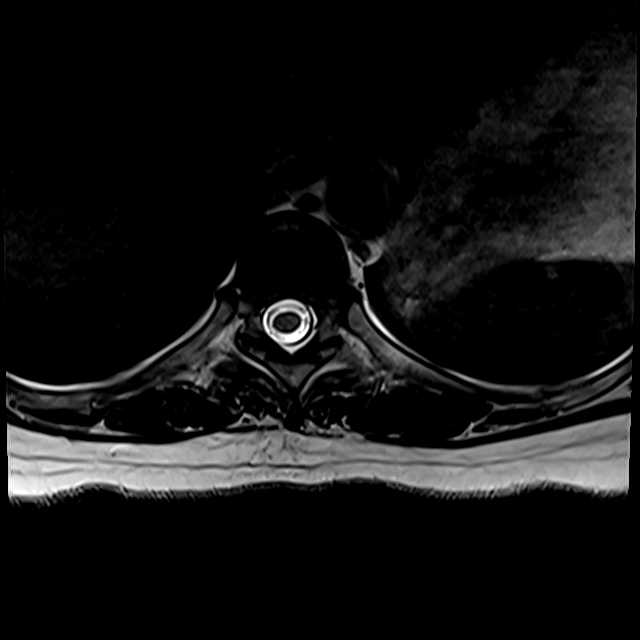
[im 31/54]
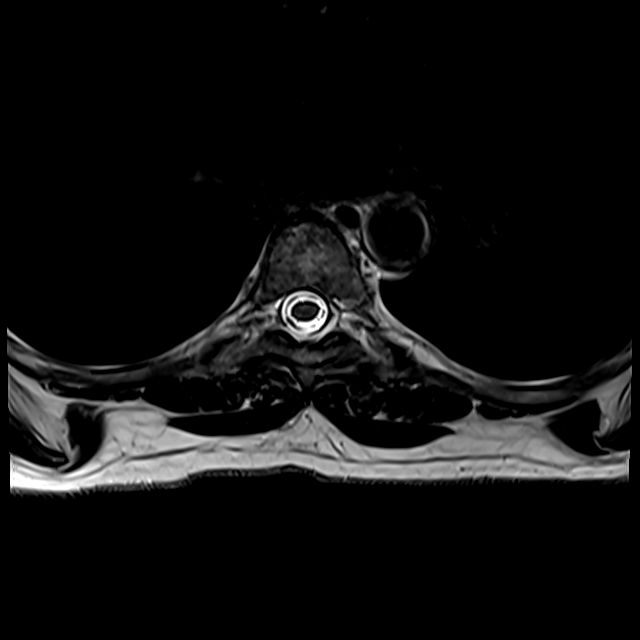
[im 38/54]
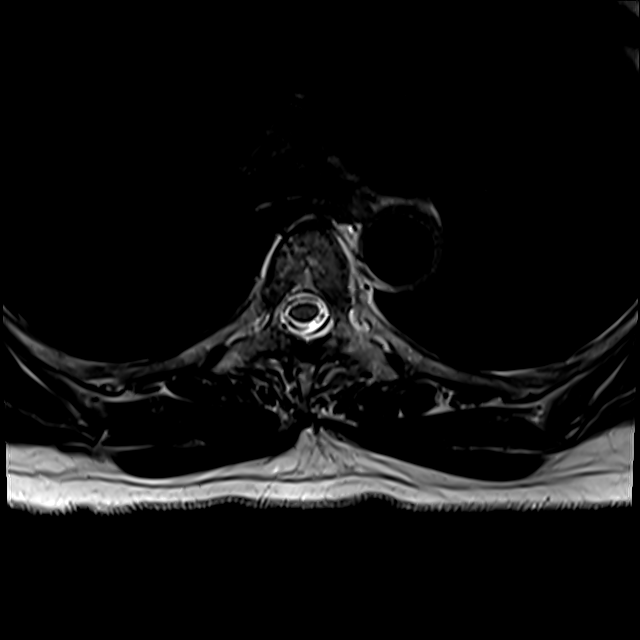
[im 46/54]
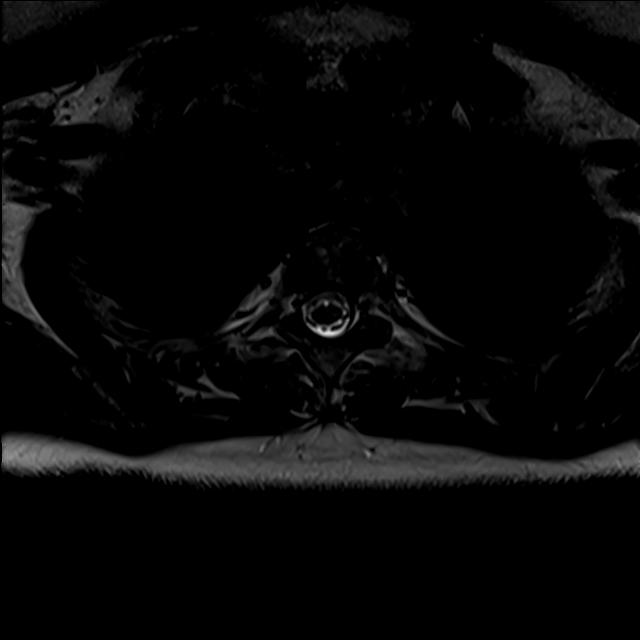
[im 54/54]
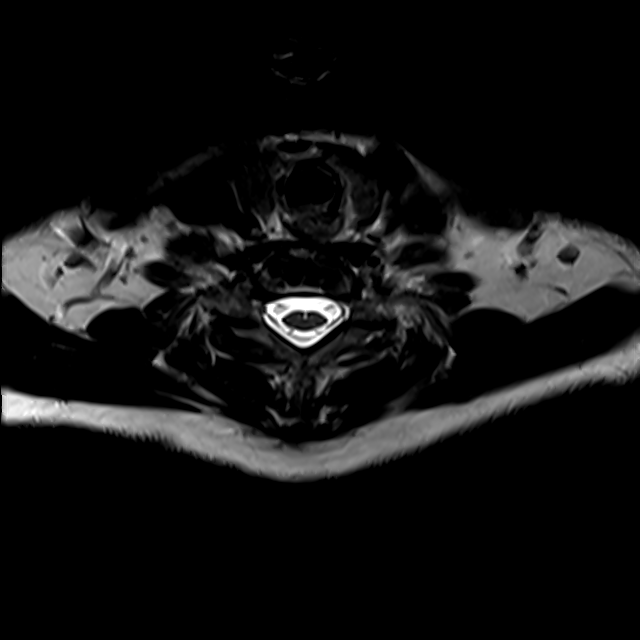

[Series 22: T1 · axial · non-contrast · 4.0mm · 0.56mm/px · z∈[-304,-48]mm · 8 of 54 slices shown (2 of 2)]
[im 1/54]
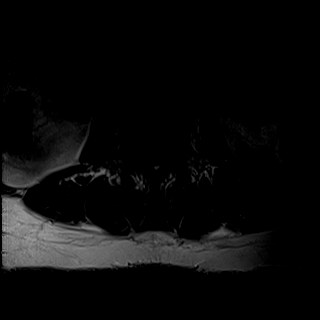
[im 8/54]
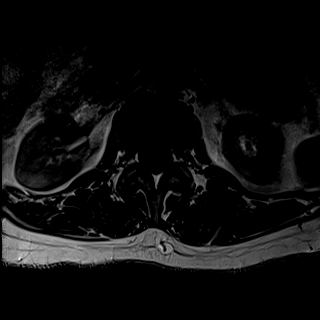
[im 16/54]
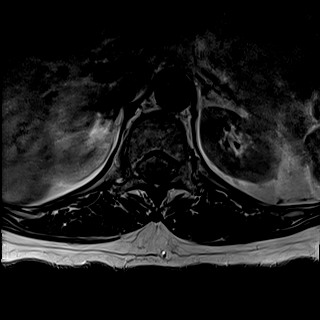
[im 23/54]
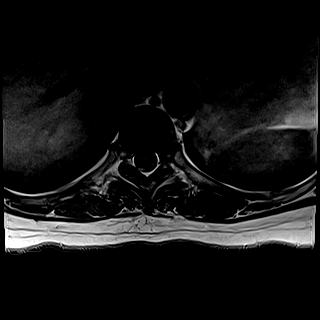
[im 31/54]
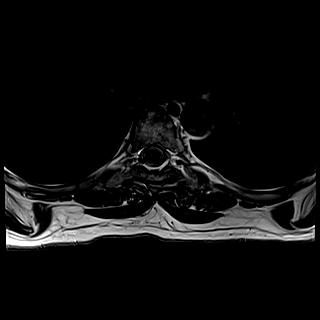
[im 38/54]
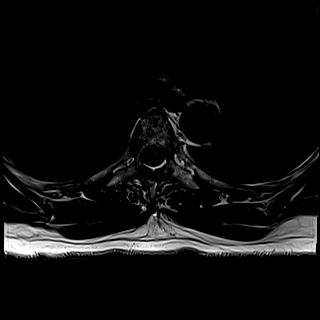
[im 46/54]
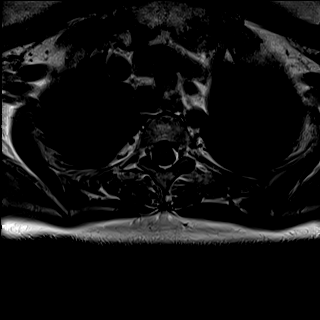
[im 54/54]
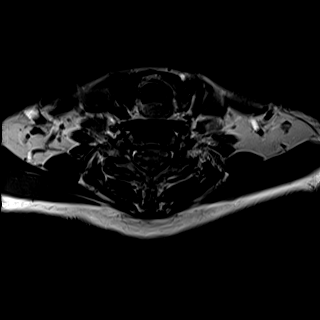

[Series 23: T2 · sagittal · 3.0mm · 0.83mm/px · 3 of 18 slices shown (3 of 3)]
[im 1/18]
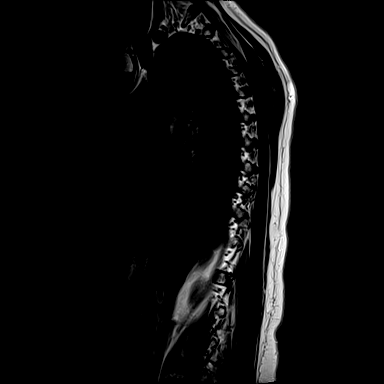
[im 9/18]
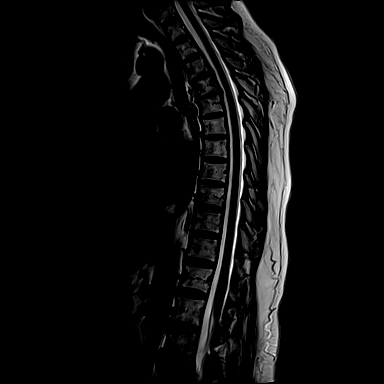
[im 18/18]
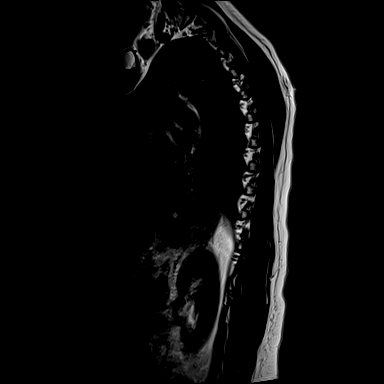

[24 of 48 positions shown; findings below may reference images not displayed]

FINDINGS: Alignment:  Normal

Vertebrae: Normal bone marrow.  Negative for fracture or mass

Cord: Normal signal and morphology. No evidence of cord lesion or
demyelinating disease.

Paraspinal and other soft tissues: Negative

Disc levels:

Mild disc degeneration throughout multiple levels of the thoracic
spine. Disc degeneration and spurring at T4-5.

No significant spinal stenosis in the thoracic spine.
IMPRESSION: Mild thoracic disc degeneration most prominent at T4-5. No
significant spinal stenosis or neural compression. Negative for mass
lesion.

## 2020-12-06 MED ORDER — GADOBENATE DIMEGLUMINE 529 MG/ML IV SOLN
13.0000 mL | Freq: Once | INTRAVENOUS | Status: AC | PRN
  Administered 2020-12-06: 13 mL via INTRAVENOUS

## 2020-12-12 ENCOUNTER — Ambulatory Visit
Admission: RE | Admit: 2020-12-12 | Discharge: 2020-12-12 | Disposition: A | Discharge status: Home / Self Care | Payer: Medicare PPO | Source: Ambulatory Visit | Attending: Neurology | Admitting: Neurology

## 2020-12-12 ENCOUNTER — Other Ambulatory Visit: Payer: Self-pay

## 2020-12-12 DIAGNOSIS — R531 Weakness: Secondary | ICD-10-CM | POA: Diagnosis not present

## 2020-12-12 DIAGNOSIS — R296 Repeated falls: Secondary | ICD-10-CM

## 2020-12-12 DIAGNOSIS — D181 Lymphangioma, any site: Secondary | ICD-10-CM | POA: Diagnosis not present

## 2020-12-12 DIAGNOSIS — R29898 Other symptoms and signs involving the musculoskeletal system: Secondary | ICD-10-CM

## 2020-12-12 DIAGNOSIS — G1220 Motor neuron disease, unspecified: Secondary | ICD-10-CM | POA: Diagnosis not present

## 2020-12-12 DIAGNOSIS — M47812 Spondylosis without myelopathy or radiculopathy, cervical region: Secondary | ICD-10-CM | POA: Diagnosis not present

## 2020-12-12 IMAGING — MR MR CERVICAL SPINE WO/W CM
7 of 9 series · 32 of 48 positions shown · IV contrast (12ml Multihance)
Comparison: Noncontrast cervical spine MRI [DATE]

CLINICAL DATA: Motor neuron disease. Left leg weakness. Frequent
falls.

EXAM:
MRI CERVICAL SPINE WITHOUT AND WITH CONTRAST
TECHNIQUE: Multiplanar and multiecho pulse sequences of the cervical spine, to
include the craniocervical junction and cervicothoracic junction,
were obtained without and with intravenous contrast.
CONTRAST:  12mL MULTIHANCE GADOBENATE DIMEGLUMINE 529 MG/ML IV SOLN

[Series 3: T2 · sagittal · 3.0mm · 0.82mm/px · 4 of 18 slices shown (1 of 2)]
[im 1/18]
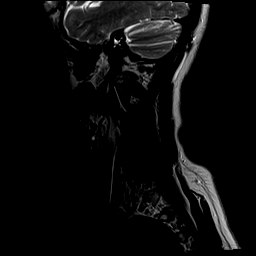
[im 6/18]
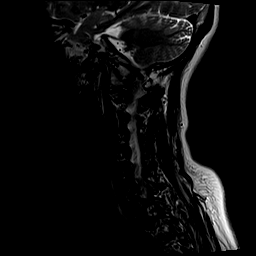
[im 12/18]
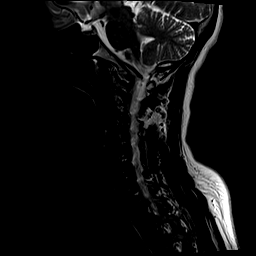
[im 18/18]
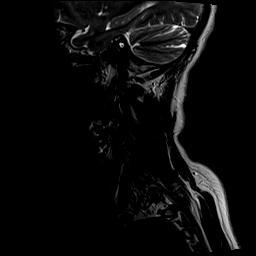

[Series 4: T1 · sagittal · 3.0mm · 0.41mm/px · 4 of 18 slices shown (1 of 3)]
[im 1/18]
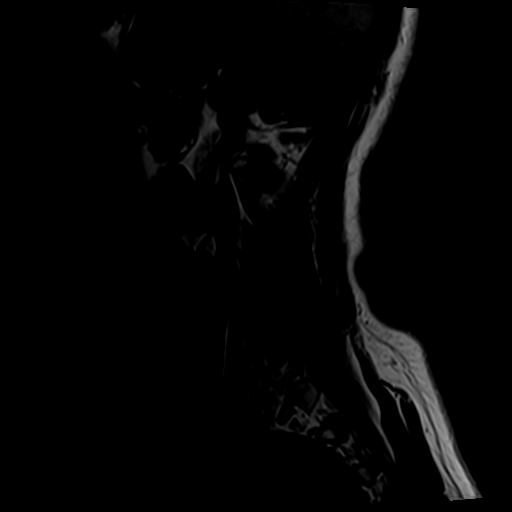
[im 6/18]
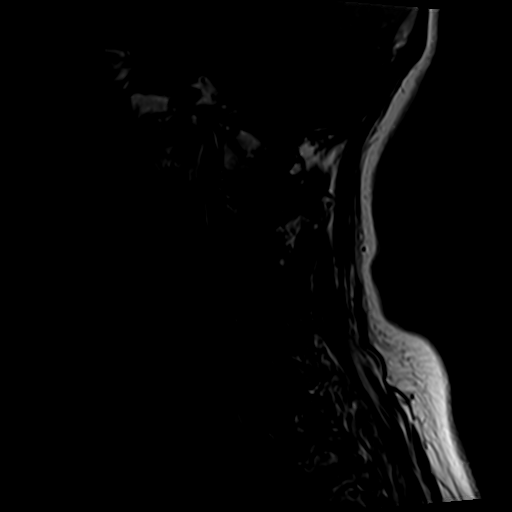
[im 12/18]
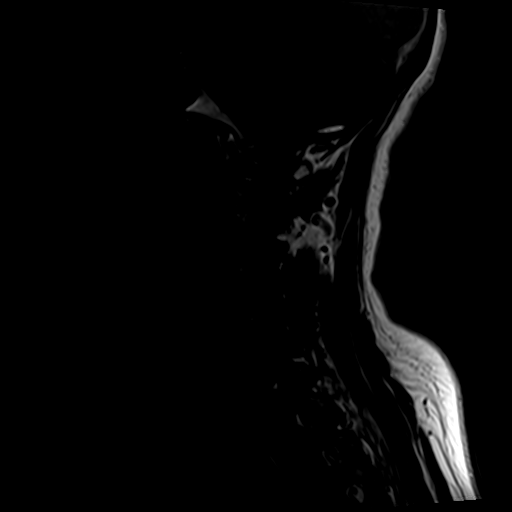
[im 18/18]
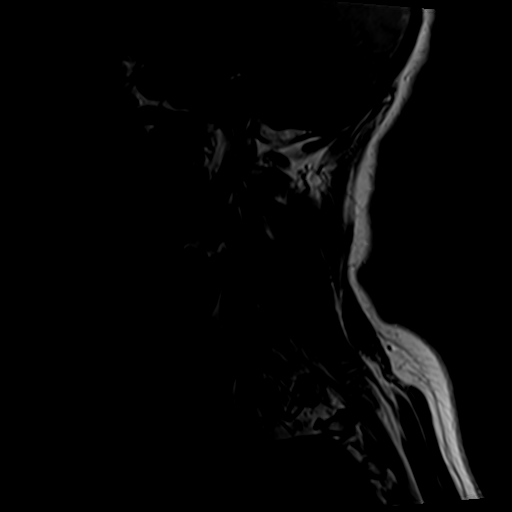

[Series 7: T1 · axial · 3.0mm · 0.35mm/px · z∈[-192,-69]mm · 7 of 34 slices shown (2 of 3)]
[im 1/34]
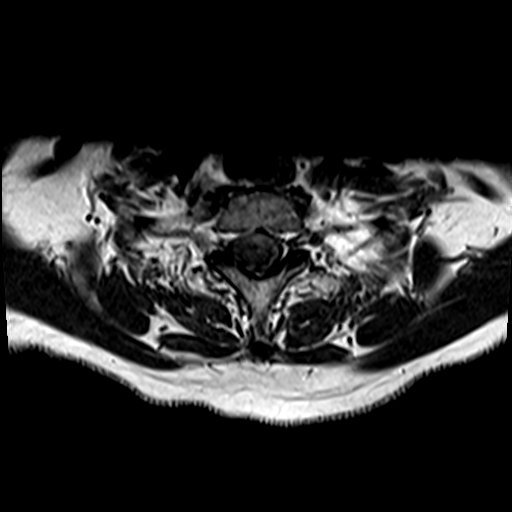
[im 6/34]
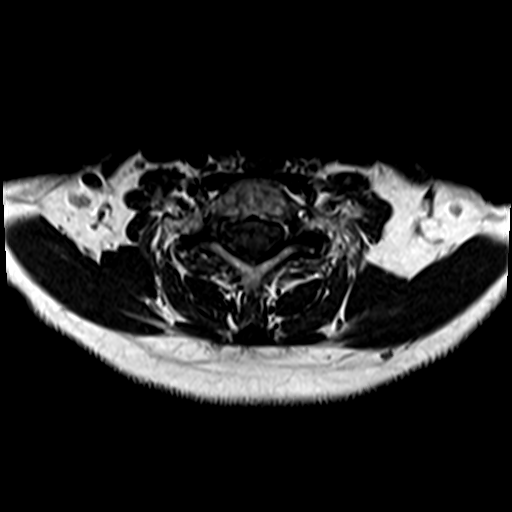
[im 12/34]
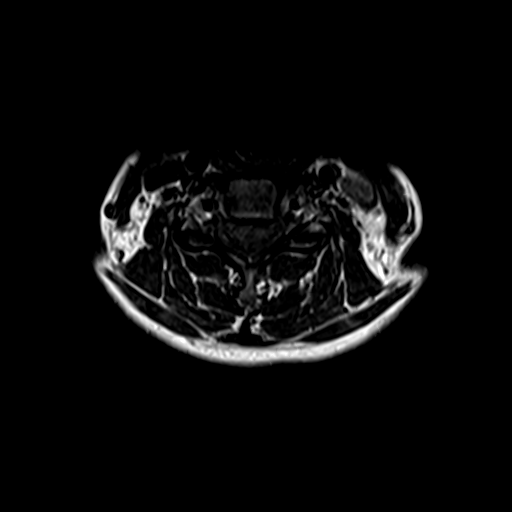
[im 17/34]
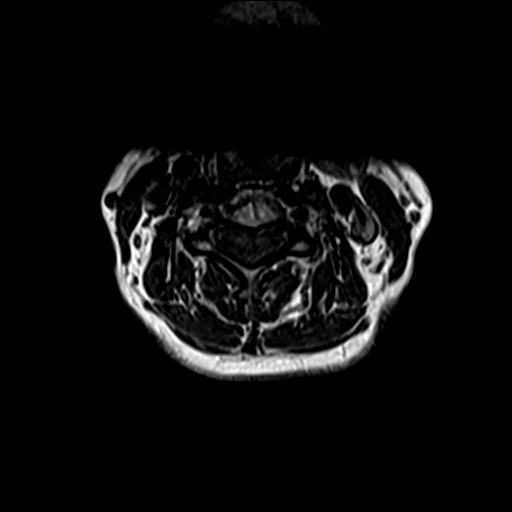
[im 23/34]
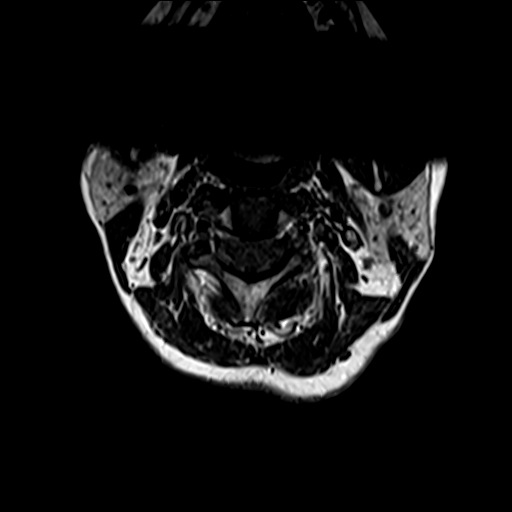
[im 28/34]
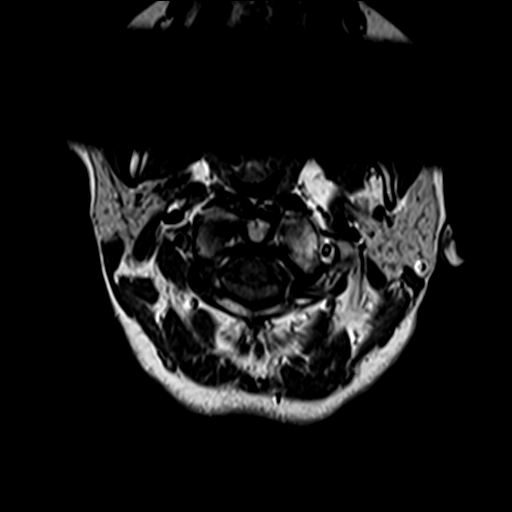
[im 34/34]
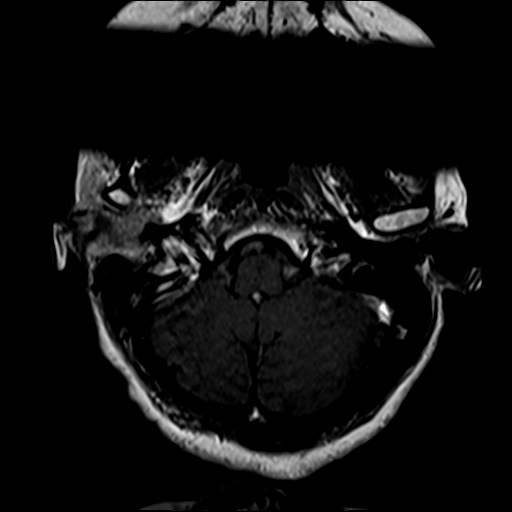

[Series 8: T1 fat-sat post-contrast · sagittal · 3.0mm · 0.82mm/px · 4 of 18 slices shown]
[im 1/18]
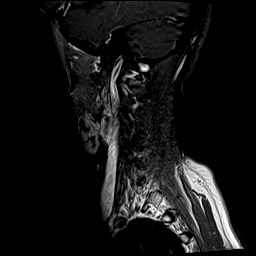
[im 6/18]
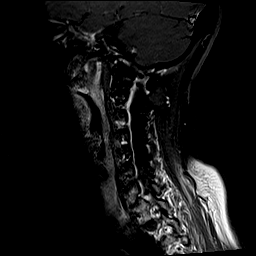
[im 12/18]
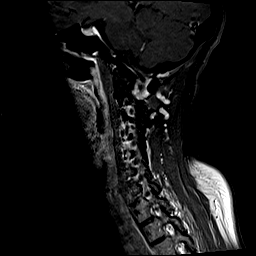
[im 18/18]
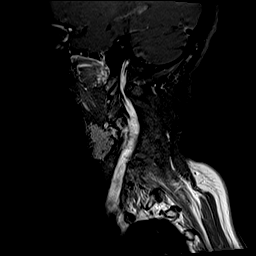

[Series 9: T1 · axial · 3.0mm · 0.35mm/px · z∈[-192,-173]mm · 2 of 34 slices shown (3 of 3)]
[im 1/34]
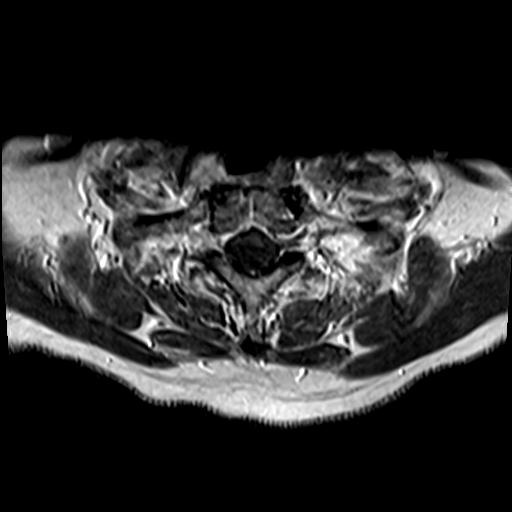
[im 6/34]
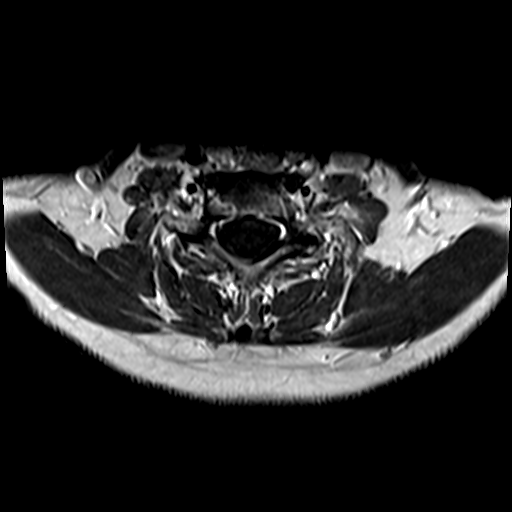

[Series 10: T2 · axial · 3.0mm · 0.70mm/px · z∈[-192,-69]mm · 7 of 34 slices shown (2 of 2)]
[im 1/34]
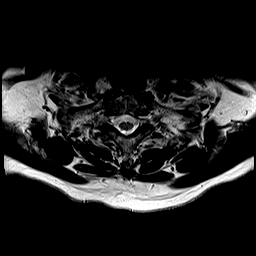
[im 6/34]
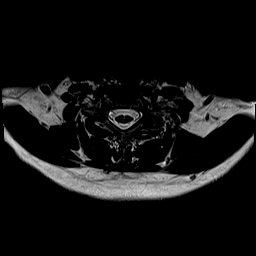
[im 12/34]
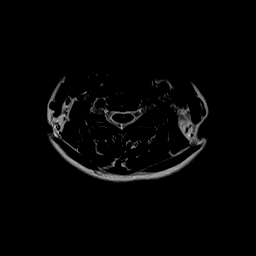
[im 17/34]
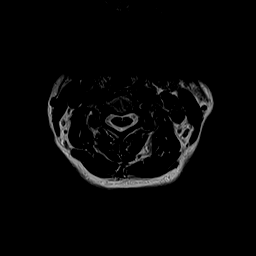
[im 23/34]
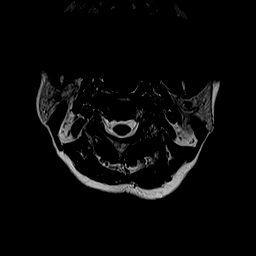
[im 28/34]
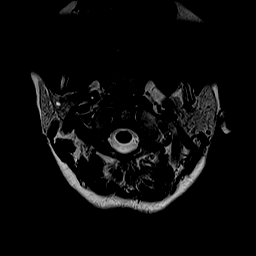
[im 34/34]
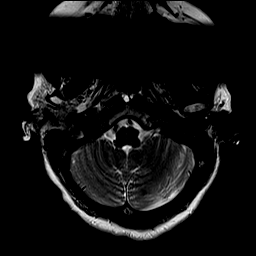

[Series 11: T2 post-contrast · sagittal · 3.0mm · 0.82mm/px · 4 of 18 slices shown]
[im 1/18]
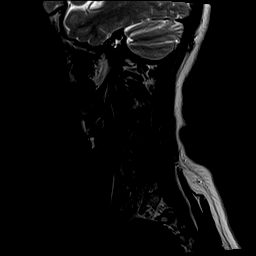
[im 6/18]
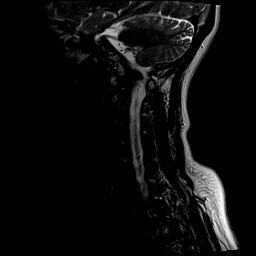
[im 12/18]
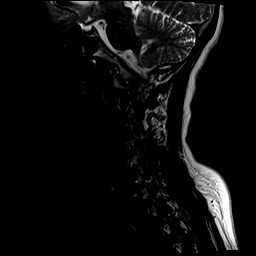
[im 18/18]
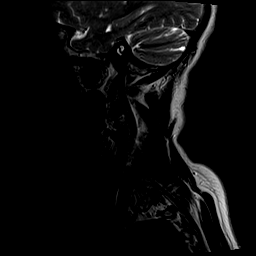

[32 of 48 positions shown; findings below may reference images not displayed]

FINDINGS: Alignment: Normal.

Vertebrae: No fracture, suspicious marrow lesion, or significant
marrow edema.

Cord: Unchanged 6 mm focus of T2 hyperintensity in the left hemicord
at C6 with slight cord volume loss and no enhancement. Normal cord
signal and morphology elsewhere.

Posterior Fossa, vertebral arteries, paraspinal tissues:
Unremarkable.

Disc levels:

Preserved disc space heights throughout the cervical spine.
Unchanged minimal disc bulging at C5-6 without stenosis or spinal
cord mass effect.
IMPRESSION: 1. Unchanged focal T2 hyperintensity in the spinal cord at C6 with
slight volume loss and no enhancement favoring an old nonspecific
insult.
2. Otherwise normal appearance of the cervical spinal cord.
3. Minimal cervical spondylosis without stenosis.

## 2020-12-12 IMAGING — MR MR HEAD WO/W CM
12 series · 48 of 48 positions shown · IV contrast (multihance)
Comparison: None.

CLINICAL DATA: Motor neuron disease. Left leg weakness. Frequent
falls.

EXAM:
MRI HEAD WITHOUT AND WITH CONTRAST
TECHNIQUE: Multiplanar, multiecho pulse sequences of the brain and surrounding
structures were obtained without and with intravenous contrast.
CONTRAST:  12mL MULTIHANCE GADOBENATE DIMEGLUMINE 529 MG/ML IV SOLN

[Series 2: T1 · sagittal · 5.0mm · 0.45mm/px · 1 of 25 slices shown]
[im 1/25]
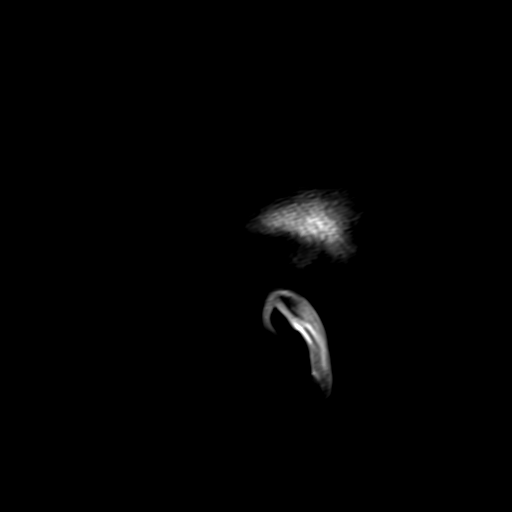

[Series 3: ax ep2d_diff_3 · axial · 3.0mm · 1.80mm/px · z∈[-69,+77]mm · 6 of 99 slices shown]
[im 1/99]
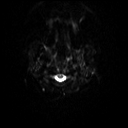
[im 20/99]
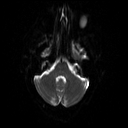
[im 40/99]
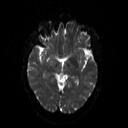
[im 59/99]
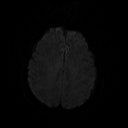
[im 79/99]
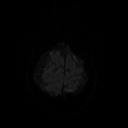
[im 99/99]
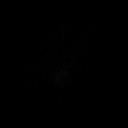

[Series 4: ax ep2d_diff_3_adc · axial · 3.0mm · 1.80mm/px · z∈[-69,+77]mm · 3 of 49 slices shown]
[im 1/49]
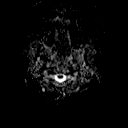
[im 25/49]
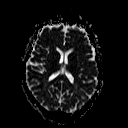
[im 49/49]
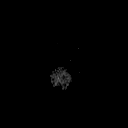

[Series 5: cor ep2d_diff · coronal · 5.0mm · 1.77mm/px · 4 of 59 slices shown]
[im 1/59]
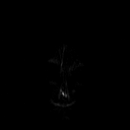
[im 20/59]
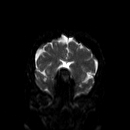
[im 39/59]
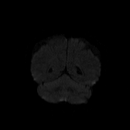
[im 59/59]
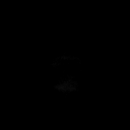

[Series 6: cor ep2d_diff_adc · coronal · 5.0mm · 1.77mm/px · 2 of 30 slices shown]
[im 1/30]
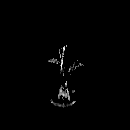
[im 30/30]
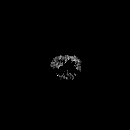

[Series 8: swi_images · axial · 2.0mm · 0.98mm/px · z∈[-69,+73]mm · 4 of 72 slices shown]
[im 1/72]
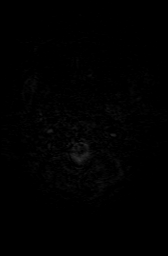
[im 24/72]
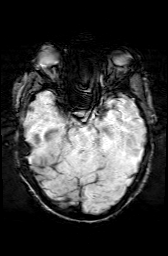
[im 48/72]
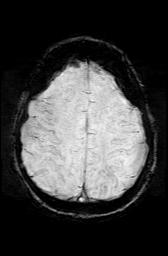
[im 72/72]
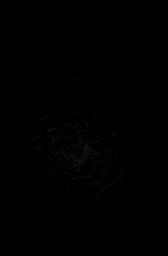

[Series 9: FLAIR · axial · 3.0mm · 0.43mm/px · z∈[-71,+80]mm · 2 of 40 slices shown]
[im 1/40]
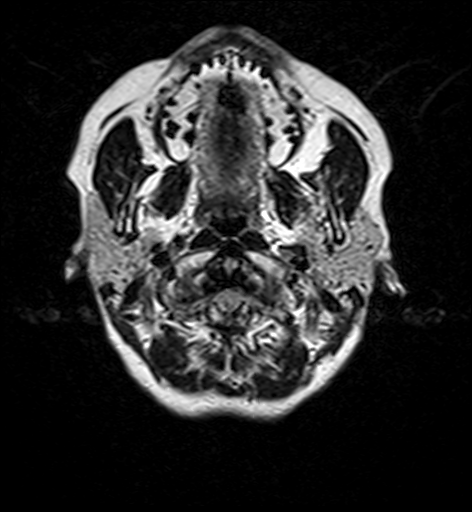
[im 40/40]
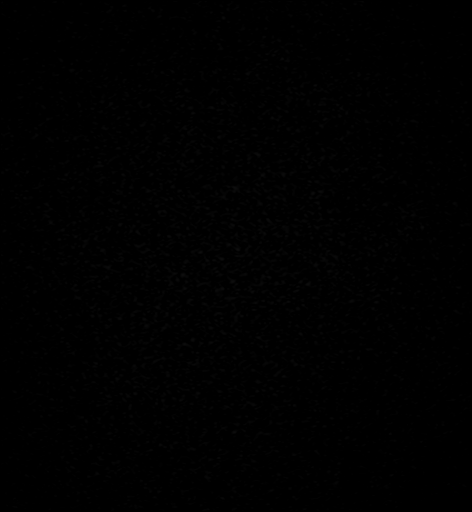

[Series 10: T2 · axial · 5.0mm · 0.65mm/px · z∈[-76,+79]mm · 2 of 27 slices shown (1 of 2)]
[im 1/27]
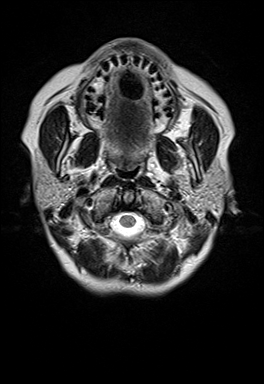
[im 27/27]
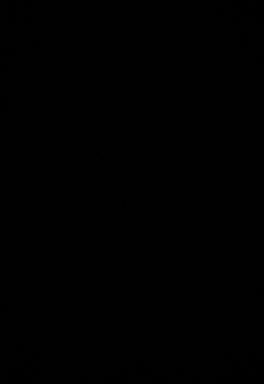

[Series 11: t1_mpr_tra · axial · 1.0mm · 0.72mm/px · z∈[-77,+82]mm · 10 of 160 slices shown]
[im 1/160]
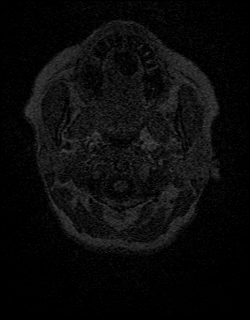
[im 18/160]
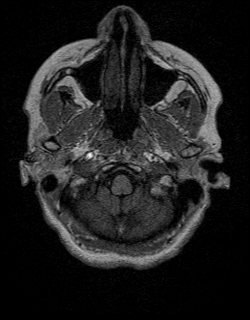
[im 36/160]
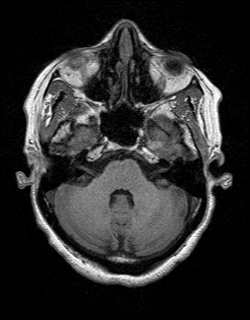
[im 54/160]
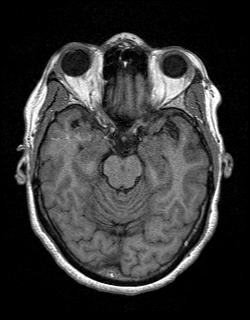
[im 71/160]
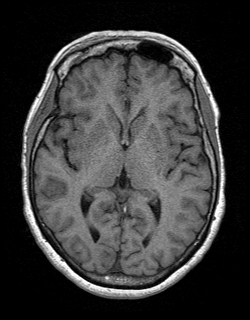
[im 89/160]
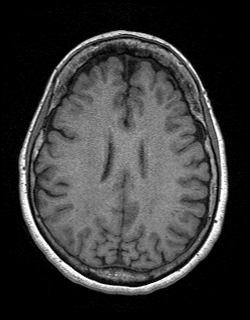
[im 107/160]
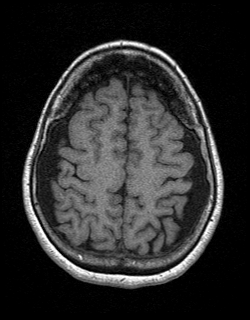
[im 124/160]
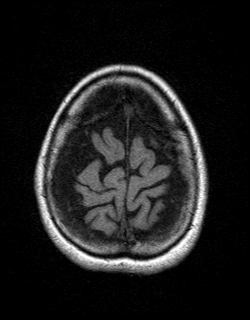
[im 142/160]
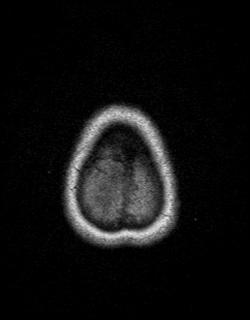
[im 160/160]
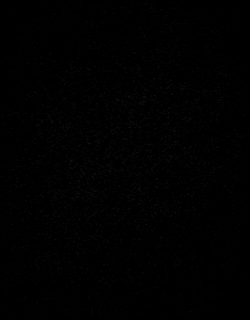

[Series 12: T2 · coronal · 5.0mm · 0.43mm/px · 2 of 32 slices shown (2 of 2)]
[im 1/32]
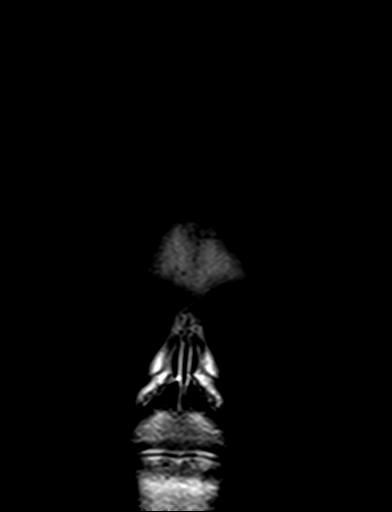
[im 32/32]
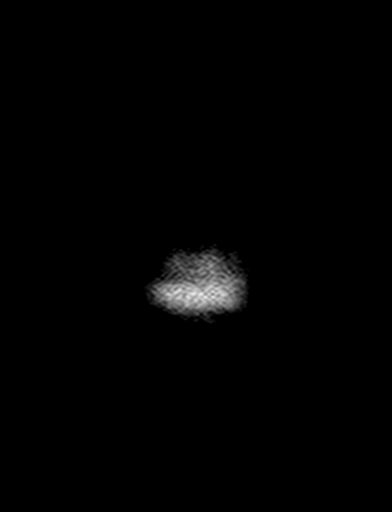

[Series 13: post t1_mpr_tra · axial · 1.0mm · 0.72mm/px · z∈[-77,+82]mm · 10 of 160 slices shown]
[im 1/160]
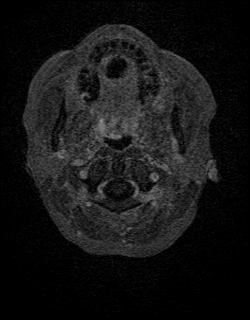
[im 18/160]
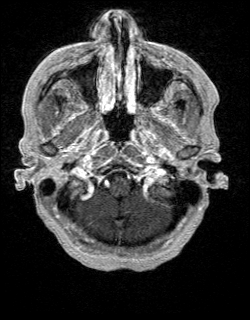
[im 36/160]
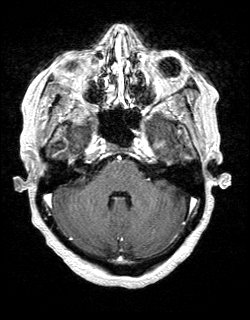
[im 54/160]
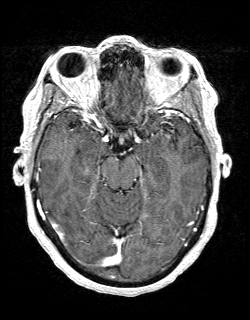
[im 71/160]
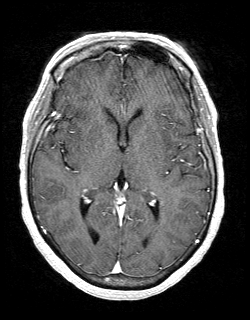
[im 89/160]
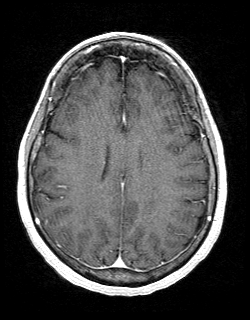
[im 107/160]
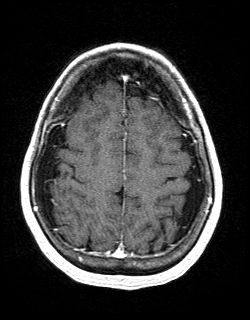
[im 124/160]
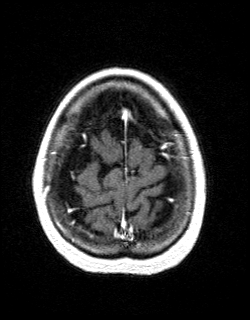
[im 142/160]
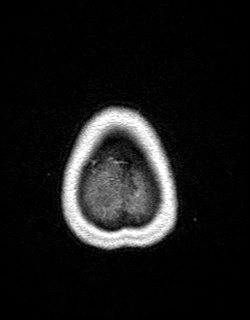
[im 160/160]
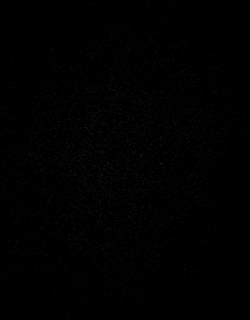

[Series 14: T1 post-contrast · coronal · 5.0mm · 0.72mm/px · 2 of 32 slices shown]
[im 1/32]
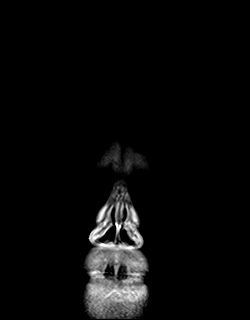
[im 32/32]
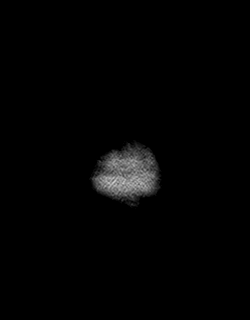

[48 of 48 positions shown; findings below may reference images not displayed]

FINDINGS: Brain: There is no evidence of an acute infarct or mass. Subdural
fluid collections over the frontoparietal convexities measure up to
1.1 cm in thickness on the right and 1.0 cm on the left. No internal
blood products are evident on susceptibility weighted imaging, and
these collections are most consistent with subdural hygromas
resulting in mild flattening of the underlying gyri but no midline
shift. T2 hyperintensities in the cerebral white matter bilaterally
are nonspecific but compatible with minimal chronic small vessel
ischemic disease. No abnormal enhancement is identified. The
ventricles are normal in size.

Vascular: Major intracranial vascular flow voids are preserved.

Skull and upper cervical spine: Unremarkable bone marrow signal.

Sinuses/Orbits: Unremarkable orbits. Minimal fluid in the left
sphenoid sinus. Clear mastoid air cells.

Other: None.
IMPRESSION: 1. Small bilateral subdural hygromas with mild mass effect. No
midline shift.
2. Minimal chronic small vessel ischemic disease.

## 2020-12-12 MED ORDER — GADOBENATE DIMEGLUMINE 529 MG/ML IV SOLN
12.0000 mL | Freq: Once | INTRAVENOUS | Status: AC | PRN
  Administered 2020-12-12: 12 mL via INTRAVENOUS

## 2020-12-19 ENCOUNTER — Telehealth: Payer: Self-pay

## 2020-12-19 DIAGNOSIS — R29898 Other symptoms and signs involving the musculoskeletal system: Secondary | ICD-10-CM

## 2020-12-19 NOTE — Telephone Encounter (Signed)
-----   Message from Jessica Dallas, DO sent at 12/15/2020 11:32 AM EDT ----- MRI of brain and spinal cord reviewed.  The finding in the spinal cord looks like a possible old tiny spinal cord injury.  The MRI of brain shows evidence of spinal fluid collection over the brain, usually develops after somebody once had a bleed in the brain a long time ago - both of these findings may be explained by falls that occurred years ago.  It does not explain why she is having worsening left leg weakness.  I would like to proceed with nerve conduction study of the left leg - my colleague Dr. Allena Katz performs the test.  The first part will involve getting small electric zaps in the leg (feels like a static shock).  The second part involves putting a very thin needle in the muscles of the leg to look for nerve and muscle damage.

## 2020-12-19 NOTE — Telephone Encounter (Signed)
Pt advised of her MRI Brain And Cervical spine.  Pt aware front desk will call and schedule for the EMG.   Message sent to the front to schedule.

## 2020-12-22 DIAGNOSIS — Z01419 Encounter for gynecological examination (general) (routine) without abnormal findings: Secondary | ICD-10-CM | POA: Diagnosis not present

## 2020-12-22 DIAGNOSIS — Z6823 Body mass index (BMI) 23.0-23.9, adult: Secondary | ICD-10-CM | POA: Diagnosis not present

## 2020-12-31 DIAGNOSIS — Z0000 Encounter for general adult medical examination without abnormal findings: Secondary | ICD-10-CM | POA: Diagnosis not present

## 2020-12-31 DIAGNOSIS — J45909 Unspecified asthma, uncomplicated: Secondary | ICD-10-CM | POA: Diagnosis not present

## 2020-12-31 DIAGNOSIS — I10 Essential (primary) hypertension: Secondary | ICD-10-CM | POA: Diagnosis not present

## 2020-12-31 DIAGNOSIS — E7800 Pure hypercholesterolemia, unspecified: Secondary | ICD-10-CM | POA: Diagnosis not present

## 2020-12-31 DIAGNOSIS — K219 Gastro-esophageal reflux disease without esophagitis: Secondary | ICD-10-CM | POA: Diagnosis not present

## 2020-12-31 DIAGNOSIS — Z1389 Encounter for screening for other disorder: Secondary | ICD-10-CM | POA: Diagnosis not present

## 2020-12-31 DIAGNOSIS — Z23 Encounter for immunization: Secondary | ICD-10-CM | POA: Diagnosis not present

## 2020-12-31 DIAGNOSIS — E1169 Type 2 diabetes mellitus with other specified complication: Secondary | ICD-10-CM | POA: Diagnosis not present

## 2020-12-31 DIAGNOSIS — J309 Allergic rhinitis, unspecified: Secondary | ICD-10-CM | POA: Diagnosis not present

## 2021-02-02 DIAGNOSIS — R208 Other disturbances of skin sensation: Secondary | ICD-10-CM | POA: Diagnosis not present

## 2021-02-02 DIAGNOSIS — L819 Disorder of pigmentation, unspecified: Secondary | ICD-10-CM | POA: Diagnosis not present

## 2021-02-03 ENCOUNTER — Other Ambulatory Visit: Payer: Self-pay

## 2021-02-03 ENCOUNTER — Ambulatory Visit: Payer: Medicare PPO | Admitting: Neurology

## 2021-02-03 DIAGNOSIS — R29898 Other symptoms and signs involving the musculoskeletal system: Secondary | ICD-10-CM

## 2021-02-03 NOTE — Procedures (Signed)
[facility]  [address] [place], [address]  [place], [place] [address] Tel: [phone] Fax:  [phone] Test Date:  02/03/2021  Patient: Jessica Woodard DOB: 1950-07-26 Physician: Nita Sickle, DO  Sex: Female Height: 5\' 1"  Ref Phys: , D.O.  ID#: Shon Millet   Technician:    Patient Complaints: This is a 70 year old female referred for evaluation of left leg weakness.  NCV & EMG Findings: Electrodiagnostic testing was prematurely terminated at patient's request due to pain.  Findings show:  Left superficial peroneal sensory response is within normal limits. Submaximal testing of the left peroneal motor response at the extensor digitorum brevis.  Impression: This is an incomplete study.  Electrodiagnostic testing was terminated at patient's request due to pain.   ___________________________ 66, DO    Nerve Conduction Studies Anti Sensory Summary Table   Stim Site NR Peak (ms) Norm Peak (ms) P-T Amp (V) Norm P-T Amp  Left Sup Peroneal Anti Sensory (Ant Lat Mall)  32C  12 cm    2.5 <4.6 32.1 >3   Motor Summary Table   Stim Site NR Onset (ms) Norm Onset (ms) O-P Amp (mV) Norm O-P Amp Site1 Site2 Delta-0 (ms) Dist (cm) Vel (m/s) Norm Vel (m/s)  LefMotor (Ext Dig Brev)  32C  Ankle    6.0 <6.0 1.4 >2.5            Waveforms:

## 2021-02-11 DIAGNOSIS — H0102B Squamous blepharitis left eye, upper and lower eyelids: Secondary | ICD-10-CM | POA: Diagnosis not present

## 2021-02-11 DIAGNOSIS — H2513 Age-related nuclear cataract, bilateral: Secondary | ICD-10-CM | POA: Diagnosis not present

## 2021-02-11 DIAGNOSIS — H532 Diplopia: Secondary | ICD-10-CM | POA: Diagnosis not present

## 2021-02-11 DIAGNOSIS — H5010 Unspecified exotropia: Secondary | ICD-10-CM | POA: Diagnosis not present

## 2021-02-11 DIAGNOSIS — E119 Type 2 diabetes mellitus without complications: Secondary | ICD-10-CM | POA: Diagnosis not present

## 2021-02-11 DIAGNOSIS — H40013 Open angle with borderline findings, low risk, bilateral: Secondary | ICD-10-CM | POA: Diagnosis not present

## 2021-02-11 DIAGNOSIS — H5022 Vertical strabismus, left eye: Secondary | ICD-10-CM | POA: Diagnosis not present

## 2021-02-11 DIAGNOSIS — H0102A Squamous blepharitis right eye, upper and lower eyelids: Secondary | ICD-10-CM | POA: Diagnosis not present

## 2021-02-11 DIAGNOSIS — H1045 Other chronic allergic conjunctivitis: Secondary | ICD-10-CM | POA: Diagnosis not present

## 2021-02-18 DIAGNOSIS — I10 Essential (primary) hypertension: Secondary | ICD-10-CM | POA: Diagnosis not present

## 2021-02-18 DIAGNOSIS — E1169 Type 2 diabetes mellitus with other specified complication: Secondary | ICD-10-CM | POA: Diagnosis not present

## 2021-04-22 DIAGNOSIS — H0102A Squamous blepharitis right eye, upper and lower eyelids: Secondary | ICD-10-CM | POA: Diagnosis not present

## 2021-04-22 DIAGNOSIS — H00022 Hordeolum internum right lower eyelid: Secondary | ICD-10-CM | POA: Diagnosis not present

## 2021-04-22 DIAGNOSIS — H0102B Squamous blepharitis left eye, upper and lower eyelids: Secondary | ICD-10-CM | POA: Diagnosis not present

## 2021-05-18 ENCOUNTER — Ambulatory Visit: Payer: Medicare PPO | Admitting: Pulmonary Disease

## 2021-05-18 ENCOUNTER — Other Ambulatory Visit: Payer: Self-pay

## 2021-05-18 ENCOUNTER — Encounter: Payer: Self-pay | Admitting: Pulmonary Disease

## 2021-05-18 VITALS — BP 102/60 | HR 62 | Ht 61.0 in | Wt 130.0 lb

## 2021-05-18 DIAGNOSIS — R001 Bradycardia, unspecified: Secondary | ICD-10-CM

## 2021-05-18 DIAGNOSIS — D869 Sarcoidosis, unspecified: Secondary | ICD-10-CM

## 2021-05-18 DIAGNOSIS — J4530 Mild persistent asthma, uncomplicated: Secondary | ICD-10-CM | POA: Diagnosis not present

## 2021-05-18 DIAGNOSIS — F513 Sleepwalking [somnambulism]: Secondary | ICD-10-CM | POA: Diagnosis not present

## 2021-05-18 MED ORDER — FLUTICASONE PROPIONATE HFA 110 MCG/ACT IN AERO: 1.0000 | INHALATION_SPRAY | Freq: Two times a day (BID) | RESPIRATORY_TRACT | 12 refills | Status: DC

## 2021-05-18 NOTE — Progress Notes (Signed)
Subjective:   PATIENT ID: [name] GENDER: female DOB: [birth year], MRN: [id number]   HPI  Chief Complaint  Patient presents with   Follow-up    No updates, feeling fine    Reason for Visit: Follow-up  Ms. Renita Brocks is a 71 year old female never smoker with asthma, allergic rhinitis, sarcoid and GERD who presents for follow-up.  Synopsis: Diagnosed with sarcoid by ophthalmologist >10 years. Currently followed by Ophthalmologist, Dr. Dione Booze. Previously followed by Dr. Kendrick Fries with no pulmonary involvement. Lost to follow-up and re-established care with Pulmonary in 2021. Recently she was advised by Dermatology for a skin biopsy last month for a new lesion on her face however she declined.  04/04/20 Overall her asthma symptoms are well-controlled. She reports using Alvesco ONE puff once a day in the morning. She notices shortness of breath with occasional wheezing with heavy exertion like raking leaves etc. Does not use a rescue inhaler. She does notice symptoms occurring at night when laying down.  She reports left facial hyperpigmentation that has been present for the last two years and new right hyperpigmented spot on her right hand. She has previously been advised to have biopsy and is awaiting appointment with [facility] in October. She is concerned what this lesion is and has not had any answers. Does not improve with any OTC creams or lotions.  05/18/21 Our last pulmonary visit was over 1 year ago.  Her exam was noted for sinus bradycardia and cardiac MRI was ordered which was negative for sarcoid involvement. She is also seeing her dermatologist for maturational hyperpigmentation which is improving on Hydroquinone cream. She is scheduled for her eye doctor for sarcoid for which she routinely is seeing every 6 months.   She is compliant with Alvesco. Denies shortness of breath, cough, wheezing and chest pain. She was also seen by [facility] for left foot drop  however unable to complete EMG.  She reports sleeping 10:30/11pm to 7/8am. She falls asleep easily but is waking up at night. She reports sleep walking including opening doors and moving objects.   Social History: Never smoker  Past Medical History:  Diagnosis Date   Allergic rhinitis, cause unspecified    Allergy    seasonal    Anxiety    sleep walking/night terrors   Asthma    inhalers   BRONCHITIS, ACUTE 04/29/2009   Qualifier: Diagnosis of  By: Maple Hudson MD, Clinton D    GERD (gastroesophageal reflux disease)    Hypertension    Sarcoid      Family History  Problem Relation Age of Onset   Asthma Mother    Colon polyps Mother    Diabetes Mother    Heart disease Mother    Stroke Mother    Kidney disease Mother    Asthma Maternal Grandmother    Asthma Maternal Grandfather    Asthma Paternal Grandfather    Asthma Paternal Grandmother    Asthma Sister    Breast cancer Sister    Diabetes Sister    Asthma Brother    Colon polyps Brother    Heart disease Brother    Colon cancer Neg Hx      Social History   Occupational History   Occupation: Retired Runner, broadcasting/film/video  Tobacco Use   Smoking status: Never   Smokeless tobacco: Never  Vaping Use   Vaping Use: Never used  Substance and Sexual Activity   Alcohol use: No    Alcohol/week: 0.0 standard drinks  Drug use: No   Sexual activity: Not on file    Allergies  Allergen Reactions   Sulfa Antibiotics Rash   Cefdinir     REACTION: diarrhea   Other Itching   Roflumilast Diarrhea   Sulfonamide Derivatives Rash     Outpatient Medications Prior to Visit  Medication Sig Dispense Refill   albuterol (VENTOLIN HFA) 108 (90 Base) MCG/ACT inhaler Inhale 2 puffs into the lungs every 6 (six) hours as needed for wheezing or shortness of breath. 1 each 3   APPLE CIDER VINEGAR PO See admin instructions.     Ascorbic Acid (VITAMIN C) 100 MG tablet Take 100 mg by mouth daily.     aspirin 81 MG tablet Take 81 mg by mouth daily.      Cholecalciferol (VITAMIN D3) 250 MCG (10000 UT) capsule 2 capsules     lansoprazole (PREVACID) 30 MG capsule Take 30 mg by mouth 2 (two) times daily before a meal.     metFORMIN (GLUCOPHAGE-XR) 500 MG 24 hr tablet      metoprolol (TOPROL-XL) 50 MG 24 hr tablet Take 75 mg by mouth daily.     montelukast (SINGULAIR) 10 MG tablet Take 10 mg by mouth at bedtime.     Omega-3 Fatty Acids (FISH OIL) 1200 MG CAPS Take 1 capsule by mouth daily.     OneTouch Delica Lancets 33G MISC      Propylene Glycol 0.6 % SOLN Apply to eye.     rosuvastatin (CRESTOR) 5 MG tablet      spironolactone (ALDACTONE) 25 MG tablet      telmisartan (MICARDIS) 40 MG tablet TAKE 1 TABLET BY MOUTH EVERY DAY FOR BLOOD PRESSURE     valACYclovir (VALTREX) 500 MG tablet Take 1 tablet by mouth Daily.     ciclesonide (ALVESCO) 80 MCG/ACT inhaler Inhale 1 puff into the lungs 2 (two) times daily. 1 each 0   Facility-Administered Medications Prior to Visit  Medication Dose Route Frequency Provider Last Rate Last Admin   0.9 %  sodium chloride infusion  500 mL Intravenous Continuous Danis, Starr LakeHenry L III, MD        Review of Systems  Constitutional:  Negative for chills, diaphoresis, fever, malaise/fatigue and weight loss.  HENT:  Negative for congestion.   Respiratory:  Negative for cough, hemoptysis, sputum production, shortness of breath and wheezing.   Cardiovascular:  Negative for chest pain, palpitations and leg swelling.   Objective:   Vitals:   05/18/21 1059  BP: 102/60  Pulse: 62  SpO2: 99%  Weight: 130 lb (59 kg)  Height: 5\' 1"  (1.549 m)   SpO2: 99 % O2 Device: None (Room air)  Physical Exam: General: Well-appearing, no acute distress HENT: [place], AT Eyes: EOMI, no scleral icterus Respiratory: Clear to auscultation bilaterally.  No crackles, wheezing or rales Cardiovascular: RRR, -M/R/G, no JVD Extremities:-Edema,-tenderness Skin: Right hyperpigmentation of left cheek, small right dorsal aspect of right  hand Neuro: AAO x4, CNII-XII grossly intact Psych: Normal mood, normal affect  Data Reviewed:  Imaging: CXR 12/28/19 - Normal CXR. No infiltrates, effusion or edema. No hilar adenopathy   PFT: 01/22/20 FVC 2.12 (97%) FEV1 1.86 (110%) Ratio 86  TLC 76% DLCO 86% Interpretation: Mild restrictive defect with normal gas exchange.  Labs: CBC    Component Value Date/Time   WBC 6.5 05/14/2010 0934   RBC 4.31 05/14/2010 0934   HGB 14.0 09/03/2010 1253   HCT 39.8 05/14/2010 0934   PLT 213.0 05/14/2010 0934   MCV  92.3 05/14/2010 0934   MCHC 34.5 05/14/2010 0934   RDW 12.7 05/14/2010 0934   LYMPHSABS 2.7 05/14/2010 0934   MONOABS 0.5 05/14/2010 0934   EOSABS 0.5 05/14/2010 0934   BASOSABS 0.0 05/14/2010 0934   BMET    Component Value Date/Time   NA 142 09/02/2010 1330   K 4.3 09/02/2010 1330   CL 105 09/02/2010 1330   CO2 31 09/02/2010 1330   GLUCOSE 87 09/02/2010 1330   BUN 11 09/02/2010 1330   CREATININE 0.81 09/02/2010 1330   CALCIUM 9.7 09/02/2010 1330   GFRNONAA >60 09/02/2010 1330   GFRAA >60 09/02/2010 1330   Assessment & Plan:   Discussion: 71 year old female with 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  We discussed the clinical course of sarcoid and benefit of routine monitoring for extrapulmonary symptoms.  She does have restrictive defect and mild asthma-like symptoms likely related to her sarcoid.  Since her last visit her skin lesions are fortunately not related to her sarcoid. Cardiac MRI 2022 with no evidence of sarcoid  Intermittent asthma - well-controlled --STOP Alvesco 80 mcg ONE puff twice a day --START Flovent 110 mcg ONE puff TWICE a day --CONTINUE montelukast 10 mg daily --ORDER Pulmonary function tests  History of sarcoid with eye involvement Mild restrictive defect --Diagnosed by her ophthalmologist >10 years ago --No indication for prednisone therapy --Annual PFTs.  ORDER PFTs --Recommend annual ophthalmology exam.  Last visit on July 2021.  --EKG showed sinus bradycardia.  --Please request Primary Care to obtain CBC and CMP. Send results for our records  Sinus bradycardia --EKG reviewed and confirmed SB  Laryngopharyngeal Reflux --Previously followed by ENT at [facility] with Dr. Jenne Pane --Continue Lansoprazole 30 mg BID  Health Maintenance Immunization History  Administered Date(s) Administered   Influenza Split 02/24/2009, 01/14/2014, 12/14/2014, 01/15/2015, 12/11/2015, 11/30/2016, 11/19/2017, 01/01/2019, 12/06/2019   Influenza Whole 01/13/2009, 12/22/2009   Influenza, High Dose Seasonal PF 12/31/2020   Influenza-Unspecified 01/21/2011   PFIZER(Purple Top)SARS-COV-2 Vaccination 04/19/2019, 05/10/2019, 12/24/2019   Pneumococcal Conjugate-13 04/17/2014   Pneumococcal Polysaccharide-23 04/17/2015   Td 06/15/2005   Tdap 09/11/2013   Zoster, Live 10/14/2014, 12/31/2020, 04/02/2021   CT Lung Screen - not qualified. Never smoker  Orders Placed This Encounter  Procedures   Ambulatory referral to Pulmonology    Referral Priority:   Urgent    Referral Type:   Consultation    Referral Reason:   Specialty Services Required    Requested Specialty:   Pulmonary Disease    Number of Visits Requested:   1   EKG 12-Lead   Pulmonary function test    Standing Status:   Future    Standing Expiration Date:   05/19/2022    Order Specific Question:   Where should this test be performed?    Answer:   [facility]    Order Specific Question:   Full PFT: includes the following: basic spirometry, spirometry pre & post bronchodilator, diffusion capacity (DLCO), lung volumes    Answer:   Full PFT   Meds ordered this encounter  Medications   fluticasone (FLOVENT HFA) 110 MCG/ACT inhaler    Sig: Inhale 1 puff into the lungs in the morning and at bedtime.    Dispense:  1 each    Refill:  12    Return in about 2 months (around 07/18/2021).  I have spent  a total time of 35-minutes on the day of the appointment reviewing prior  documentation, coordinating care and discussing medical diagnosis and plan with the patient/family. Past medical history, allergies, medications were reviewed. Pertinent imaging, labs and tests included in this note have been reviewed and interpreted independently by me.  Renia Mikelson Mechele Collin, MD [facility] 05/18/2021 5:48 PM  Office Number [phone]

## 2021-05-18 NOTE — Patient Instructions (Addendum)
Intermittent asthma - well-controlled ?--STOP Alvesco 80 mcg ONE puff twice a day ?--START Flovent 110 mcg ONE puff TWICE a day ?--CONTINUE montelukast 10 mg daily ?--ORDER Pulmonary function tests ? ?Sinus bradycardia ?--EKG today ? ?Sleep walking ?--REFER to Sleep Physician (urgent) ? ?--Please request Primary Care to obtain CBC and CMP. Send results for our records ? ?Follow-up with me in 2 months with PFTs prior to visit ?

## 2021-05-28 ENCOUNTER — Institutional Professional Consult (permissible substitution): Payer: Medicare PPO | Admitting: Primary Care

## 2021-06-23 ENCOUNTER — Ambulatory Visit (INDEPENDENT_AMBULATORY_CARE_PROVIDER_SITE_OTHER): Payer: Medicare PPO | Admitting: Primary Care

## 2021-06-23 ENCOUNTER — Encounter: Payer: Self-pay | Admitting: Primary Care

## 2021-06-23 VITALS — BP 108/70 | HR 71 | Ht 62.0 in | Wt 133.4 lb

## 2021-06-23 DIAGNOSIS — R0683 Snoring: Secondary | ICD-10-CM | POA: Diagnosis not present

## 2021-06-23 DIAGNOSIS — Z9189 Other specified personal risk factors, not elsewhere classified: Secondary | ICD-10-CM

## 2021-06-23 DIAGNOSIS — F513 Sleepwalking [somnambulism]: Secondary | ICD-10-CM | POA: Diagnosis not present

## 2021-06-23 DIAGNOSIS — G4752 REM sleep behavior disorder: Secondary | ICD-10-CM | POA: Insufficient documentation

## 2021-06-23 NOTE — Progress Notes (Signed)
? ?@Patient  ID: Jessica Woodard, female    DOB: 1950-08-13, 71 y.o.   MRN: [id number] ? ?Chief Complaint  ?Patient presents with  ? Consult  ?  Sleep walking,eating, and dancing  ? ? ?Referring provider: ?Gaynelle Arabian, MD ? ?HPI: ?71 year old female, never smoked.  Past medical history significant for sarcoidosis, asthma, allergic rhinitis, GERD, sleep walking.  ? ?06/23/2021 ?Patient presents today for sleep consult.  She was referred by Dr. Loanne Drilling for sleepwalking. She has been having symptoms of night terrors along with sleep walking/talking since she was a child. She reports jumping out of bed, walking into other rooms, turning on/off lights and opening doors, cursing at people, throwing things. Night terrors occur most nights and are related to snakes or spiders. She often wakes up with her heart racing and short of breath. She is unsure if she snores. She falls asleep easily at night. She lives by her self. Denies symptoms of narcolepsy or cataplexy.  ? ?Sleep questionnaire ?Symptoms- Night terrors, sleep walking/talking  ?Prior sleep study- None ?Bedtime- 10pm-12am ?Time to fall asleep- within minutes  ?Nocturnal awakenings- once ?Out of bed/start of day- 7:30-8:30am ?Weight changes-  -/+ 10 lbs  ?Do you operate heavy machinery- No ?Do you currently wear CPAP- No ?Do you current wear oxygen- No  ?Epworth- 16 ? ?Allergies  ?Allergen Reactions  ? Sulfa Antibiotics Rash  ? Cefdinir   ?  REACTION: diarrhea  ? Other Itching  ? Roflumilast Diarrhea  ? Sulfonamide Derivatives Rash  ? ? ?Immunization History  ?Administered Date(s) Administered  ? Influenza Split 02/24/2009, 01/14/2014, 12/14/2014, 01/15/2015, 12/11/2015, 11/30/2016, 11/19/2017, 01/01/2019, 12/06/2019  ? Influenza Whole 01/13/2009, 12/22/2009  ? Influenza, High Dose Seasonal PF 12/31/2020  ? Influenza-Unspecified 01/21/2011  ? PFIZER(Purple Top)SARS-COV-2 Vaccination 04/19/2019, 05/10/2019, 12/24/2019  ? Pneumococcal Conjugate-13 04/17/2014  ?  Pneumococcal Polysaccharide-23 04/17/2015  ? Td 06/15/2005  ? Tdap 09/11/2013  ? Zoster, Live 10/14/2014, 12/31/2020, 04/02/2021  ? ? ?Past Medical History:  ?Diagnosis Date  ? Allergic rhinitis, cause unspecified   ? Allergy   ? seasonal   ? Anxiety   ? sleep walking/night terrors  ? Asthma   ? inhalers  ? BRONCHITIS, ACUTE 04/29/2009  ? Qualifier: Diagnosis of  By: Annamaria Boots MD, Tarri Fuller D   ? GERD (gastroesophageal reflux disease)   ? Hypertension   ? Sarcoid   ? ? ?Tobacco History: ?Social History  ? ?Tobacco Use  ?Smoking Status Never  ?Smokeless Tobacco Never  ? ?Counseling given: Not Answered ? ? ?Outpatient Medications Prior to Visit  ?Medication Sig Dispense Refill  ? albuterol (VENTOLIN HFA) 108 (90 Base) MCG/ACT inhaler Inhale 2 puffs into the lungs every 6 (six) hours as needed for wheezing or shortness of breath. 1 each 3  ? APPLE CIDER VINEGAR PO See admin instructions.    ? Ascorbic Acid (VITAMIN C) 100 MG tablet Take 100 mg by mouth daily.    ? aspirin 81 MG tablet Take 81 mg by mouth daily.    ? Cholecalciferol (VITAMIN D3) 250 MCG (10000 UT) capsule 2 capsules    ? lansoprazole (PREVACID) 30 MG capsule Take 30 mg by mouth 2 (two) times daily before a meal.    ? metFORMIN (GLUCOPHAGE-XR) 500 MG 24 hr tablet     ? metoprolol (TOPROL-XL) 50 MG 24 hr tablet Take 75 mg by mouth daily.    ? montelukast (SINGULAIR) 10 MG tablet Take 10 mg by mouth at bedtime.    ? Omega-3 Fatty Acids (FISH OIL)  1200 MG CAPS Take 1 capsule by mouth daily.    ? OneTouch Delica Lancets [id number] MISC     ? Propylene Glycol 0.6 % SOLN Apply to eye.    ? rosuvastatin (CRESTOR) 5 MG tablet     ? spironolactone (ALDACTONE) 25 MG tablet     ? telmisartan (MICARDIS) 40 MG tablet TAKE 1 TABLET BY MOUTH EVERY DAY FOR BLOOD PRESSURE    ? valACYclovir (VALTREX) 500 MG tablet Take 1 tablet by mouth Daily.    ? fluticasone (FLOVENT HFA) 110 MCG/ACT inhaler Inhale 1 puff into the lungs in the morning and at bedtime. (Patient not taking: Reported on  06/23/2021) 1 each 12  ? ?Facility-Administered Medications Prior to Visit  ?Medication Dose Route Frequency Provider Last Rate Last Admin  ? 0.9 %  sodium chloride infusion  500 mL Intravenous Continuous Danis, Kirke Corin, MD      ? ? ?Review of Systems ? ?Review of Systems  ?Constitutional:  Positive for fatigue.  ?HENT: Negative.    ?Respiratory: Negative.    ?Psychiatric/Behavioral:  Positive for sleep disturbance.   ? ? ?Physical Exam ? ?BP 108/70 (BP Location: Right Arm, Cuff Size: Normal)   Pulse 71   Ht 5\' 2"  (1.575 m)   Wt 133 lb 6.4 oz (60.5 kg)   SpO2 100%   BMI 24.40 kg/m?  ?Physical Exam ?Constitutional:   ?   Appearance: Normal appearance.  ?HENT:  ?   Head: Normocephalic and atraumatic.  ?   Mouth/Throat:  ?   Mouth: Mucous membranes are moist.  ?   Pharynx: Oropharynx is clear.  ?Cardiovascular:  ?   Rate and Rhythm: Normal rate and regular rhythm.  ?Pulmonary:  ?   Effort: Pulmonary effort is normal.  ?   Breath sounds: Normal breath sounds.  ?Musculoskeletal:     ?   General: Normal range of motion.  ?Skin: ?   General: Skin is warm and dry.  ?Neurological:  ?   General: No focal deficit present.  ?   Mental Status: She is alert and oriented to person, place, and time. Mental status is at baseline.  ?Psychiatric:     ?   Mood and Affect: Mood normal.     ?   Behavior: Behavior normal.     ?   Thought Content: Thought content normal.     ?   Judgment: Judgment normal.  ?  ? ?Lab Results: ? ?CBC ?   ?Component Value Date/Time  ? WBC 6.5 05/14/2010 0934  ? RBC 4.31 05/14/2010 0934  ? HGB 14.0 09/03/2010 1253  ? HCT 39.8 05/14/2010 0934  ? PLT 213.0 05/14/2010 0934  ? MCV 92.3 05/14/2010 0934  ? MCHC 34.5 05/14/2010 0934  ? RDW 12.7 05/14/2010 0934  ? LYMPHSABS 2.7 05/14/2010 0934  ? MONOABS 0.5 05/14/2010 0934  ? EOSABS 0.5 05/14/2010 0934  ? BASOSABS 0.0 05/14/2010 0934  ? ? ?BMET ?   ?Component Value Date/Time  ? NA 142 09/02/2010 1330  ? K 4.3 09/02/2010 1330  ? CL 105 09/02/2010 1330  ? CO2  31 09/02/2010 1330  ? GLUCOSE 87 09/02/2010 1330  ? BUN 11 09/02/2010 1330  ? CREATININE 0.81 09/02/2010 1330  ? CALCIUM 9.7 09/02/2010 1330  ? GFRNONAA >60 09/02/2010 1330  ? GFRAA >60 09/02/2010 1330  ? ? ?BNP ?No results found for: BNP ? ?ProBNP ?No results found for: PROBNP ? ?Imaging: ?No results found. ? ? ?Assessment & Plan:  ? ?  REM sleep behavior disorder ?- Patient describes symptoms consistent with REM sleep behavior disorder. She reports having night terrors along with sleep walking/talking since she was a child. Symptoms occur most nights. Need to rule out underlying OSA as contributing/worsening factor. Recommenf she start taking melatonin 5mg  at bedtime. If infective at controlling REM disorder would try switching to Klonopin 0.5mg  qhs. Review sagely measures. Advised she lock her doors and any potential dangerous objections such as knives or guns. FU in 4-6 weeks.    ? ?At risk for sleep apnea ?- Patient has symptoms of REM sleep behavior disorder along with daytime sleepiness. She is unsure if she snores. She has never had a sleep study before. Epworth 16.  Concern patient could have obstructive sleep apnea, needs in-lab sleep study evaluate. Discussed risk of untreated sleep apnea including cardiac arrhythmias, pulm HTN, stroke, DM. We briefly reviewed treatment options. Encouraged patient to work on weight loss efforts and focus on side sleeping position/elevate head of bed. Advised against driving if experiencing excessive daytime sleepiness. Follow-up in 4-6 weeks to review sleep study results and discuss treatment options further. ? ? ?Martyn Ehrich, NP ?06/23/2021 ? ?

## 2021-06-23 NOTE — Patient Instructions (Signed)
Concern you could have REM sleep disorder and possibly undiagnosed sleep apnea ?Recommend getting in lab sleep study ?Start melatonin 5mg  at bedtime  ?Ensure doors are locked at night and dangerous/sharp objects are locked up as well  ? ?Orders: ?In-lab sleep study ? ?Follow-up: ?6 weeks with NP  ? ? ? ?

## 2021-06-23 NOTE — Assessment & Plan Note (Signed)
-   Patient describes symptoms consistent with REM sleep behavior disorder. She reports having night terrors along with sleep walking/talking since she was a child. Symptoms occur most nights. Need to rule out underlying OSA as contributing/worsening factor. Recommenf she start taking melatonin 5mg  at bedtime. If infective at controlling REM disorder would try switching to Klonopin 0.5mg  qhs. Review sagely measures. Advised she lock her doors and any potential dangerous objections such as knives or guns. FU in 4-6 weeks.    ?

## 2021-06-23 NOTE — Assessment & Plan Note (Signed)
-   Patient has symptoms of REM sleep behavior disorder along with daytime sleepiness. She is unsure if she snores. She has never had a sleep study before. Epworth 16.  Concern patient could have obstructive sleep apnea, needs in-lab sleep study evaluate. Discussed risk of untreated sleep apnea including cardiac arrhythmias, pulm HTN, stroke, DM. We briefly reviewed treatment options. Encouraged patient to work on weight loss efforts and focus on side sleeping position/elevate head of bed. Advised against driving if experiencing excessive daytime sleepiness. Follow-up in 4-6 weeks to review sleep study results and discuss treatment options further. ?

## 2021-06-25 NOTE — Progress Notes (Signed)
Reviewed and agree with assessment/plan. ? ? ?Nahuel Wilbert, MD ?[facility] ?06/25/2021, 11:15 AM ?Pager:  [phone] ? ?

## 2021-07-01 DIAGNOSIS — I10 Essential (primary) hypertension: Secondary | ICD-10-CM | POA: Diagnosis not present

## 2021-07-01 DIAGNOSIS — N183 Chronic kidney disease, stage 3 unspecified: Secondary | ICD-10-CM | POA: Diagnosis not present

## 2021-07-01 DIAGNOSIS — K219 Gastro-esophageal reflux disease without esophagitis: Secondary | ICD-10-CM | POA: Diagnosis not present

## 2021-07-01 DIAGNOSIS — J45909 Unspecified asthma, uncomplicated: Secondary | ICD-10-CM | POA: Diagnosis not present

## 2021-07-01 DIAGNOSIS — E7800 Pure hypercholesterolemia, unspecified: Secondary | ICD-10-CM | POA: Diagnosis not present

## 2021-07-01 DIAGNOSIS — J309 Allergic rhinitis, unspecified: Secondary | ICD-10-CM | POA: Diagnosis not present

## 2021-07-01 DIAGNOSIS — E1169 Type 2 diabetes mellitus with other specified complication: Secondary | ICD-10-CM | POA: Diagnosis not present

## 2021-07-01 DIAGNOSIS — D869 Sarcoidosis, unspecified: Secondary | ICD-10-CM | POA: Diagnosis not present

## 2021-07-28 ENCOUNTER — Encounter: Payer: Self-pay | Admitting: Pulmonary Disease

## 2021-07-28 ENCOUNTER — Ambulatory Visit (INDEPENDENT_AMBULATORY_CARE_PROVIDER_SITE_OTHER): Payer: Medicare PPO | Admitting: Pulmonary Disease

## 2021-07-28 ENCOUNTER — Telehealth: Payer: Self-pay | Admitting: Primary Care

## 2021-07-28 ENCOUNTER — Ambulatory Visit: Payer: Medicare PPO | Admitting: Pulmonary Disease

## 2021-07-28 VITALS — BP 102/60 | HR 64 | Ht 62.0 in | Wt 132.4 lb

## 2021-07-28 DIAGNOSIS — J4530 Mild persistent asthma, uncomplicated: Secondary | ICD-10-CM | POA: Diagnosis not present

## 2021-07-28 DIAGNOSIS — D869 Sarcoidosis, unspecified: Secondary | ICD-10-CM | POA: Diagnosis not present

## 2021-07-28 LAB — PULMONARY FUNCTION TEST
DL/VA % pred: 85 %
DL/VA: 3.59 ml/min/mmHg/L
DLCO cor % pred: 67 %
DLCO cor: 12.24 ml/min/mmHg
DLCO unc % pred: 67 %
DLCO unc: 12.24 ml/min/mmHg
FEF 25-75 Post: 2.88 L/sec
FEF 25-75 Pre: 2.37 L/sec
FEF2575-%Change-Post: 21 %
FEF2575-%Pred-Post: 185 %
FEF2575-%Pred-Pre: 152 %
FEV1-%Change-Post: 4 %
FEV1-%Pred-Post: 106 %
FEV1-%Pred-Pre: 101 %
FEV1-Post: 1.76 L
FEV1-Pre: 1.68 L
FEV1FVC-%Change-Post: 3 %
FEV1FVC-%Pred-Pre: 111 %
FEV6-%Change-Post: 0 %
FEV6-%Pred-Post: 95 %
FEV6-%Pred-Pre: 95 %
FEV6-Post: 1.97 L
FEV6-Pre: 1.95 L
FEV6FVC-%Pred-Post: 104 %
FEV6FVC-%Pred-Pre: 104 %
FVC-%Change-Post: 0 %
FVC-%Pred-Post: 91 %
FVC-%Pred-Pre: 90 %
FVC-Post: 1.97 L
FVC-Pre: 1.95 L
Post FEV1/FVC ratio: 89 %
Post FEV6/FVC ratio: 100 %
Pre FEV1/FVC ratio: 86 %
Pre FEV6/FVC Ratio: 100 %
RV % pred: 80 %
RV: 1.68 L
TLC % pred: 75 %
TLC: 3.61 L

## 2021-07-28 NOTE — Patient Instructions (Addendum)
Fatigue ?Unclear if this is related to sleep vs possible sarcoid flare. We discussed further testing and after discussion will await sleep testing before pursuing PET/CT sarcoid study ?--SEND message to [facility] regarding scheduling of sleep test ordered in April 2023 ?--SEND message to Dr. Halford Chessman for follow-up ?--If symptoms unresolved with sleep management, will order PET/CT ? ?Intermittent asthma - well-controlled ?--CONTINUE Alvesco 80 mcg ONE puff twice a day ?--CONTINUE montelukast 10 mg daily ? ? ?Follow-up with me in 5 months ?

## 2021-07-28 NOTE — Progress Notes (Signed)
Subjective:   PATIENT ID: Jessica Woodard GENDER: female DOB: 1950-04-29, MRN: [id number]   HPI  Chief Complaint  Patient presents with   Follow-up    Pft results Past 69mo feeling fatigued     Reason for Visit: Follow-up  [name] is a 71 year old female never smoker with asthma, allergic rhinitis, sarcoid and GERD who presents for follow-up.  Synopsis: Diagnosed with sarcoid by ophthalmologist >10 years. Currently followed by Ophthalmologist, Dr. Dione Booze. Previously followed by Dr. Kendrick Fries with no pulmonary involvement. Lost to follow-up and re-established care with Pulmonary in 2021. Recently she was advised by Dermatology for a skin biopsy last month for a new lesion on her face however she declined.  04/04/20 Overall her asthma symptoms are well-controlled. She reports using Alvesco ONE puff once a day in the morning. She notices shortness of breath with occasional wheezing with heavy exertion like raking leaves etc. Does not use a rescue inhaler. She does notice symptoms occurring at night when laying down.  She reports left facial hyperpigmentation that has been present for the last two years and new right hyperpigmented spot on her right hand. She has previously been advised to have biopsy and is awaiting appointment with [facility] in October. She is concerned what this lesion is and has not had any answers. Does not improve with any OTC creams or lotions.  05/18/21 Our last pulmonary visit was over 1 year ago.  Her exam was noted for sinus bradycardia and cardiac MRI was ordered which was negative for sarcoid involvement. She is also seeing her dermatologist for maturational hyperpigmentation which is improving on Hydroquinone cream. She is scheduled for her eye doctor for sarcoid for which she routinely is seeing every 6 months.   She is compliant with Alvesco. Denies shortness of breath, cough, wheezing and chest pain. She was also seen by [facility] for left  foot drop however unable to complete EMG.  She reports sleeping 10:30/11pm to 7/8am. She falls asleep easily but is waking up at night. She reports sleep walking including opening doors and moving objects.   07/28/21 She reports unable to walk longer distances due to fatigue and legs giving out. Denies shortness of breath, significant wheezing or coughing. She has reflux and has reduced chocolate, milk or caffeine and this has improved her cough. She is still awaiting her sleep evaluation. She reports chronic left leg pain.   Social History: Never smoker  Past Medical History:  Diagnosis Date   Allergic rhinitis, cause unspecified    Allergy    seasonal    Anxiety    sleep walking/night terrors   Asthma    inhalers   BRONCHITIS, ACUTE 04/29/2009   Qualifier: Diagnosis of  By: Maple Hudson MD, Clinton D    GERD (gastroesophageal reflux disease)    Hypertension    Sarcoid      Family History  Problem Relation Age of Onset   Asthma Mother    Colon polyps Mother    Diabetes Mother    Heart disease Mother    Stroke Mother    Kidney disease Mother    Asthma Maternal Grandmother    Asthma Maternal Grandfather    Asthma Paternal Grandfather    Asthma Paternal Grandmother    Asthma Sister    Breast cancer Sister    Diabetes Sister    Asthma Brother    Colon polyps Brother    Heart disease Brother    Colon cancer Neg Hx  Social History   Occupational History   Occupation: Retired Runner, broadcasting/film/video  Tobacco Use   Smoking status: Never   Smokeless tobacco: Never  Vaping Use   Vaping Use: Never used  Substance and Sexual Activity   Alcohol use: No    Alcohol/week: 0.0 standard drinks   Drug use: No   Sexual activity: Not on file    Allergies  Allergen Reactions   Sulfa Antibiotics Rash   Cefdinir     REACTION: diarrhea   Other Itching   Roflumilast Diarrhea   Sulfonamide Derivatives Rash     Outpatient Medications Prior to Visit  Medication Sig Dispense Refill   APPLE  CIDER VINEGAR PO See admin instructions.     Ascorbic Acid (VITAMIN C) 100 MG tablet Take 100 mg by mouth daily.     aspirin 81 MG tablet Take 81 mg by mouth daily.     Cholecalciferol (VITAMIN D3) 250 MCG (10000 UT) capsule 2 capsules     ciclesonide (ALVESCO) 80 MCG/ACT inhaler Inhale 1 puff into the lungs 2 (two) times daily.     lansoprazole (PREVACID) 30 MG capsule Take 30 mg by mouth 2 (two) times daily before a meal.     metFORMIN (GLUCOPHAGE-XR) 500 MG 24 hr tablet      metoprolol (TOPROL-XL) 50 MG 24 hr tablet Take 75 mg by mouth daily.     montelukast (SINGULAIR) 10 MG tablet Take 10 mg by mouth at bedtime.     Omega-3 Fatty Acids (FISH OIL) 1200 MG CAPS Take 1 capsule by mouth daily.     OneTouch Delica Lancets 33G MISC      Propylene Glycol 0.6 % SOLN Apply to eye.     rosuvastatin (CRESTOR) 5 MG tablet      spironolactone (ALDACTONE) 25 MG tablet      telmisartan (MICARDIS) 40 MG tablet TAKE 1 TABLET BY MOUTH EVERY DAY FOR BLOOD PRESSURE     valACYclovir (VALTREX) 500 MG tablet Take 1 tablet by mouth Daily.     albuterol (VENTOLIN HFA) 108 (90 Base) MCG/ACT inhaler Inhale 2 puffs into the lungs every 6 (six) hours as needed for wheezing or shortness of breath. (Patient not taking: Reported on 07/28/2021) 1 each 3   fluticasone (FLOVENT HFA) 110 MCG/ACT inhaler Inhale 1 puff into the lungs in the morning and at bedtime. (Patient not taking: Reported on 06/23/2021) 1 each 12   Facility-Administered Medications Prior to Visit  Medication Dose Route Frequency Provider Last Rate Last Admin   0.9 %  sodium chloride infusion  500 mL Intravenous Continuous Danis, Andreas Blower, MD        Review of Systems  Constitutional:  Positive for malaise/fatigue. Negative for chills, diaphoresis, fever and weight loss.  HENT:  Negative for congestion.   Respiratory:  Negative for cough, hemoptysis, sputum production, shortness of breath and wheezing.   Cardiovascular:  Negative for chest pain,  palpitations and leg swelling.  Gastrointestinal:  Positive for heartburn.   Objective:   Vitals:   07/28/21 1044  BP: 102/60  Pulse: 64  SpO2: 100%  Weight: 132 lb 6.4 oz (60.1 kg)  Height: 5\' 2"  (1.575 m)   SpO2: 100 % O2 Device: None (Room air)  Physical Exam: General: Well-appearing, no acute distress HENT: [place], AT Eyes: EOMI, no scleral icterus Respiratory: Clear to auscultation bilaterally.  No crackles, wheezing or rales Cardiovascular: RRR, -M/R/G, no JVD Extremities:-Edema,-tenderness Neuro: AAO x4, CNII-XII grossly intact Psych: Normal mood, normal affect  Data  Reviewed:  Imaging: CXR 12/28/19 - Normal CXR. No infiltrates, effusion or edema. No hilar adenopathy   PFT: 01/22/20 FVC 2.12 (97%) FEV1 1.86 (110%) Ratio 86  TLC 76% DLCO 86% Interpretation: Mild restrictive defect with normal gas exchange.  07/28/21 FVC 1.97 (91%) FEV1 1.76 (106%) Ratio 86  TLC 75% DLCO 67% Interpretation: Mild restrictive defect with reduction in DLCO compared to 01/22/20. Clinically correlate   Labs: CBC    Component Value Date/Time   WBC 6.5 05/14/2010 0934   RBC 4.31 05/14/2010 0934   HGB 14.0 09/03/2010 1253   HCT 39.8 05/14/2010 0934   PLT 213.0 05/14/2010 0934   MCV 92.3 05/14/2010 0934   MCHC 34.5 05/14/2010 0934   RDW 12.7 05/14/2010 0934   LYMPHSABS 2.7 05/14/2010 0934   MONOABS 0.5 05/14/2010 0934   EOSABS 0.5 05/14/2010 0934   BASOSABS 0.0 05/14/2010 0934   BMET    Component Value Date/Time   NA 142 09/02/2010 1330   K 4.3 09/02/2010 1330   CL 105 09/02/2010 1330   CO2 31 09/02/2010 1330   GLUCOSE 87 09/02/2010 1330   BUN 11 09/02/2010 1330   CREATININE 0.81 09/02/2010 1330   CALCIUM 9.7 09/02/2010 1330   GFRNONAA >60 09/02/2010 1330   GFRAA >60 09/02/2010 1330       View : No data to display.          Assessment & Plan:   Discussion: 71 year old female with remote history of extrapulmonary sarcoid involving the eye, asthma, andn laryngeal  pharyngeal reflux who presents for follow-up. No clear evidence of active sarcoid though she does have restrictive defect and mild asthma-like symptoms likely related to this. Cardiac MRI 2022 neg for sarcoid and recent ski lesions not related to sarcoid. She does have fatigue that needs sleep work-up. She prefers to defer sarcoid imaging until this is completed as well. PFTs reviewed with reduced DLCO, unclear if this is contributing to her symptoms.  Reduced DLCO Will need to recheck prior to next visit If decreasing, will need further work-up including CT chest and echocardiogram  Fatigue Unclear if this is related to sleep vs possible sarcoid flare. We discussed further testing and after discussion will await sleep testing before pursuing PET/CT sarcoid study --SEND message to [facility] regarding scheduling of sleep test ordered in April 2023 --SEND message to Dr. Craige Cotta for follow-up --If symptoms unresolved with sleep management, will order PET/CT  Intermittent asthma - well-controlled --CONTINUE Alvesco 80 mcg ONE puff twice a day --CONTINUE montelukast 10 mg daily  History of sarcoid with eye involvement Mild restrictive defect Mild reduction DLCO reduction --Diagnosed by her ophthalmologist >10 years ago --No indication for prednisone therapy --Annual PFTs. Last 07/28/21 --Recommend annual ophthalmology exam.  Last visit on July 2021.  --EKG showed sinus bradycardia.  --Will need to follow-up PCP labs for CBC and CMET  Sinus bradycardia --EKG reviewed and confirmed SB  Laryngopharyngeal Reflux --Previously [facility] at [facility] with Dr. Jenne Pane --Continue Lansoprazole 30 mg BID  Health Maintenance Immunization History  Administered Date(s) Administered   Influenza Split 02/24/2009, 01/14/2014, 12/14/2014, 01/15/2015, 12/11/2015, 11/30/2016, 11/19/2017, 01/01/2019, 12/06/2019   Influenza Whole 01/13/2009, 12/22/2009   Influenza, High Dose Seasonal PF 12/31/2020    Influenza-Unspecified 01/21/2011   PFIZER(Purple Top)SARS-COV-2 Vaccination 04/19/2019, 05/10/2019, 12/24/2019   Pneumococcal Conjugate-13 04/17/2014   Pneumococcal Polysaccharide-23 04/17/2015   Td 06/15/2005   Tdap 09/11/2013   Zoster, Live 10/14/2014, 12/31/2020, 04/02/2021   CT Lung Screen - not qualified.  Never smoker  Orders Placed This Encounter  Procedures   Pulmonary function test    Standing Status:   Future    Standing Expiration Date:   07/29/2022    Scheduling Instructions:     DLCO only    Order Specific Question:   Where should this test be performed?    Answer:   [facility]   No orders of the defined types were placed in this encounter.   No follow-ups on file.  I have spent a total time of 30-minutes on the day of the appointment including chart review, data review, collecting history, coordinating care and discussing medical diagnosis and plan with the patient/family. Past medical history, allergies, medications were reviewed. Pertinent imaging, labs and tests included in this note have been reviewed and interpreted independently by me.       Kurtiss Wence Mechele CollinJane Tanner Yeley, MD [facility] 07/28/2021 10:52 AM  Office Number [phone]

## 2021-07-28 NOTE — Progress Notes (Signed)
PFT done today. 

## 2021-07-28 NOTE — Telephone Encounter (Signed)
Called patient and informed her that her in lab sleep study would be schedule as soon as possible. I informed her that we are behind on sleep studies but we will get er scheduled as soon as possible. Pt verbalized understanding. Nothing further needed  ?

## 2021-07-31 ENCOUNTER — Encounter: Payer: Self-pay | Admitting: Pulmonary Disease

## 2021-08-27 ENCOUNTER — Ambulatory Visit (HOSPITAL_BASED_OUTPATIENT_CLINIC_OR_DEPARTMENT_OTHER): Payer: Medicare PPO | Attending: Primary Care | Admitting: Pulmonary Disease

## 2021-08-27 DIAGNOSIS — F513 Sleepwalking [somnambulism]: Secondary | ICD-10-CM | POA: Diagnosis not present

## 2021-08-27 DIAGNOSIS — R0683 Snoring: Secondary | ICD-10-CM | POA: Diagnosis not present

## 2021-08-28 DIAGNOSIS — F513 Sleepwalking [somnambulism]: Secondary | ICD-10-CM | POA: Diagnosis not present

## 2021-08-28 NOTE — Procedures (Signed)
     Patient Name: Jessica Woodard, Jessica Woodard Date: 08/27/2021 Gender: Female D.O.B: 10-Jul-1950 Age (years): 70 Referring Provider: Ames Dura NP Height (inches): 62 Interpreting Physician: Coralyn Helling MD, ABSM Weight (lbs): 130 RPSGT: Armen Pickup BMI: 24 MRN: [id number] Neck Size: 12.50  CLINICAL INFORMATION Sleep Study Type: NPSG  Indication for sleep study: Sleep walking, night terrors, possible REM sleep behavior disordered.  Epworth Sleepiness Score: 11  SLEEP STUDY TECHNIQUE As per the AASM Manual for the Scoring of Sleep and Associated Events v2.3 (April 2016) with a hypopnea requiring 4% desaturations.  The channels recorded and monitored were frontal, central and occipital EEG, electrooculogram (EOG), submentalis EMG (chin), nasal and oral airflow, thoracic and abdominal wall motion, anterior tibialis EMG, snore microphone, electrocardiogram, and pulse oximetry.  MEDICATIONS Medications self-administered by patient taken the night of the study : METFORMIN, METOPROLOL, VALACYCLOVIR, TELMISARTIN, MOTELUKAST, LANSOPRAZOLE, ROSUVASTATIN, ASPIRIN, MULTIVITAMIN W/B12  SLEEP ARCHITECTURE The study was initiated at 9:41:16 PM and ended at 4:16:05 AM.  Sleep onset time was 41.2 minutes and the sleep efficiency was 76.9%%. The total sleep time was 303.5 minutes.  Stage REM latency was 63.5 minutes.  The patient spent 7.6%% of the night in stage N1 sleep, 61.9%% in stage N2 sleep, 10.4%% in stage N3 and 20.1% in REM.  Alpha intrusion was absent.  Supine sleep was 63.76%.  RESPIRATORY PARAMETERS The overall apnea/hypopnea index (AHI) was 0.0 per hour. There were 0 total apneas, including 0 obstructive, 0 central and 0 mixed apneas. There were 0 hypopneas and 3 RERAs.  The AHI during Stage REM sleep was 0.0 per hour.  AHI while supine was 0.0 per hour.  The mean oxygen saturation was 96.1%. The minimum SpO2 during sleep was 92.0%.  Snoring was noted during this  study.  CARDIAC DATA The 2 lead EKG demonstrated sinus rhythm. The mean heart rate was 71.7 beats per minute. Other EKG findings include: None.  LEG MOVEMENT DATA The total PLMS were 9 with a resulting PLMS index of 1.8. Associated arousal with leg movement index was 3.6 .  IMPRESSIONS - No significant obstructive sleep apnea occurred during this study (AHI = 0.0/h). - The patient had minimal or no oxygen desaturation during the study (Min O2 = 92.0%) - Mild snoring noted. - No abnormal movements during REM sleep.  DIAGNOSIS - Snoring.  RECOMMENDATIONS - Avoid alcohol, sedatives and other CNS depressants that may worsen sleep apnea and disrupt normal sleep architecture. - Sleep hygiene should be reviewed to assess factors that may improve sleep quality. - Weight management and regular exercise should be initiated or continued if appropriate.  [Electronically signed] 08/28/2021 08:33 AM  Coralyn Helling MD, ABSM Diplomate, American Board of Sleep Medicine NPI: [id number]  [facility] PH: [phone]   FX: [phone] ACCREDITED BY THE AMERICAN ACADEMY OF SLEEP MEDICINE

## 2021-09-04 ENCOUNTER — Telehealth: Payer: Self-pay | Admitting: Primary Care

## 2021-09-04 NOTE — Telephone Encounter (Signed)
In lab sleep study on 08/28/2021 showed no evidence of obstructive sleep apnea, AHI 0.0/hour.  Patient had minimal to no oxygen desaturations.  Mild snoring.  She does not need CPAP therapy.  She can be referred to orthodontics for consideration for oral device for management of snoring.  If she would like to review sleep study further please set her up with a overview to review results.

## 2021-09-08 NOTE — Telephone Encounter (Signed)
Attempted to call pt but line went directly to VM. Unable to leave VM as mailbox was full.   Will try to call back later.

## 2021-09-10 ENCOUNTER — Encounter: Payer: Self-pay | Admitting: *Deleted

## 2021-09-10 NOTE — Telephone Encounter (Signed)
Attempted to call pt but unable to reach and unable to leave VM as mailbox is full. Due to multiple attempts trying to reach pt and unable to do so, per protocol letter will be sent to pt and encounter will be closed.

## 2021-09-10 NOTE — Progress Notes (Signed)
Attempted to call pt but unable to reach. Left message for pt to return call. Due to multiple attempts trying to reach pt and unable to do so, per protocol letter will be sent to pt and encounter will be closed.

## 2021-09-22 DIAGNOSIS — R059 Cough, unspecified: Secondary | ICD-10-CM | POA: Diagnosis not present

## 2021-09-22 DIAGNOSIS — U071 COVID-19: Secondary | ICD-10-CM | POA: Diagnosis not present

## 2021-10-21 DIAGNOSIS — Z1231 Encounter for screening mammogram for malignant neoplasm of breast: Secondary | ICD-10-CM | POA: Diagnosis not present

## 2021-11-17 ENCOUNTER — Telehealth: Payer: Self-pay | Admitting: Pulmonary Disease

## 2021-11-17 NOTE — Telephone Encounter (Signed)
Called and spoke to patient about sleep study results. Patient verbalized understanding that she does not need cpap. She understood about the oral device but she declined it at this time. Nothing further needed

## 2021-12-14 ENCOUNTER — Telehealth: Payer: Self-pay | Admitting: Pulmonary Disease

## 2021-12-14 ENCOUNTER — Ambulatory Visit: Payer: Medicare PPO | Admitting: Pulmonary Disease

## 2021-12-14 ENCOUNTER — Encounter: Payer: Self-pay | Admitting: Pulmonary Disease

## 2021-12-14 VITALS — BP 110/60 | HR 65 | Ht 62.0 in | Wt 136.0 lb

## 2021-12-14 DIAGNOSIS — U099 Post covid-19 condition, unspecified: Secondary | ICD-10-CM | POA: Diagnosis not present

## 2021-12-14 DIAGNOSIS — J4530 Mild persistent asthma, uncomplicated: Secondary | ICD-10-CM | POA: Diagnosis not present

## 2021-12-14 DIAGNOSIS — R5383 Other fatigue: Secondary | ICD-10-CM | POA: Diagnosis not present

## 2021-12-14 DIAGNOSIS — D869 Sarcoidosis, unspecified: Secondary | ICD-10-CM

## 2021-12-14 DIAGNOSIS — G4752 REM sleep behavior disorder: Secondary | ICD-10-CM | POA: Diagnosis not present

## 2021-12-14 NOTE — Telephone Encounter (Signed)
Yes, patient scheduled with Dr. Annamaria Boots since Dr. Juanetta Gosling [facility] schedule had not opened yet.  Thanks for the input Beth!

## 2021-12-14 NOTE — Telephone Encounter (Signed)
Looks like she has an apt with Dr. Annamaria Boots in October. There are a couple of medications that we can be use for REM sleep behavior disorder including melatonin and clonazepam- if she hasn't tried medication he can start her on something

## 2021-12-14 NOTE — Patient Instructions (Addendum)
  COVID long hauler - dx in July 2023 Intermittent asthma - well-controlled --CONTINUE Alvesco 80 mcg ONE puff twice a day --CONTINUE montelukast 10 mg daily --REFER to pulmonary rehab  REM sleep behavior disorder --RE-REFER to Sleep physician to further management with Dr. Halford Chessman  Sarcoid --REQUEST last note from ophthalmologist, Dr. Katy Fitch  Reduced DLCO ORDER pulmonary function test in 3 months If decreasing, will need further work-up including CT chest and echocardiogram  Follow-up with me in 3 months

## 2021-12-14 NOTE — Telephone Encounter (Signed)
Dr. Halford Chessman,  Patient was seen with you and Beth for concerns for REM behaviors. Sleep study negative however she continues to have activity at night (talking, singing, getting up). Could you arrange follow-up for further evaluation/management? This is significantly affecting her energy and sleep quality.  Thanks for you help, JE

## 2021-12-14 NOTE — Progress Notes (Signed)
Subjective:   PATIENT ID: Jessica Woodard GENDER: female DOB: [birth year], MRN: [id number]   HPI  Chief Complaint  Patient presents with   Follow-up    Cough started in July when she had covid, states has been very tired    Reason for Visit: Follow-up  [name] is a 71 year old female never smoker with asthma, allergic rhinitis, sarcoid and GERD who presents for follow-up.  Synopsis: Diagnosed with sarcoid by ophthalmologist >10 years. Currently followed by Ophthalmologist, Dr. Dione Booze. Previously followed by Dr. Kendrick Fries with no pulmonary involvement. Lost to follow-up and re-established care with Pulmonary in 2021. Recently she was advised by Dermatology for a skin biopsy last month for a new lesion on her face however she declined.  04/04/20 Overall her asthma symptoms are well-controlled. She reports using Alvesco ONE puff once a day in the morning. She notices shortness of breath with occasional wheezing with heavy exertion like raking leaves etc. Does not use a rescue inhaler. She does notice symptoms occurring at night when laying down.  She reports left facial hyperpigmentation that has been present for the last two years and new right hyperpigmented spot on her right hand. She has previously been advised to have biopsy and is awaiting appointment with [facility] in October. She is concerned what this lesion is and has not had any answers. Does not improve with any OTC creams or lotions.  05/18/21 Our last pulmonary visit was over 1 year ago.  Her exam was noted for sinus bradycardia and cardiac MRI was ordered which was negative for sarcoid involvement. She is also seeing her dermatologist for maturational hyperpigmentation which is improving on Hydroquinone cream. She is scheduled for her eye doctor for sarcoid for which she routinely is seeing every 6 months.   She is compliant with Alvesco. Denies shortness of breath, cough, wheezing and chest pain. She was also seen by  [facility] Neurology for left foot drop however unable to complete EMG.  She reports sleeping 10:30/11pm to 7/8am. She falls asleep easily but is waking up at night. She reports sleep walking including opening doors and moving objects.   07/28/21 She reports unable to walk longer distances due to fatigue and legs giving out. Denies shortness of breath, significant wheezing or coughing. She has reflux and has reduced chocolate, milk or caffeine and this has improved her cough. She is still awaiting her sleep evaluation. She reports chronic left leg pain.   12/14/21 Since her covid dx in July, she has continued to have significant fatigue and exhaustion. She had sleep study completed but she continues to be active at night including singing. Previously getting out of bed and opening doors. Has been taking melatonin 5 mg which has helped some and her fiance notes that she is less active. She reports she could close her eyes and fall asleep very quickly.  Social History: Never smoker  Past Medical History:  Diagnosis Date   Allergic rhinitis, cause unspecified    Allergy    seasonal    Anxiety    sleep walking/night terrors   Asthma    inhalers   BRONCHITIS, ACUTE 04/29/2009   Qualifier: Diagnosis of  By: Maple Hudson MD, Clinton D    GERD (gastroesophageal reflux disease)    Hypertension    Sarcoid      Family History  Problem Relation Age of Onset   Asthma Mother    Colon polyps Mother    Diabetes Mother    Heart  disease Mother    Stroke Mother    Kidney disease Mother    Asthma Maternal Grandmother    Asthma Maternal Grandfather    Asthma Paternal Grandfather    Asthma Paternal Grandmother    Asthma Sister    Breast cancer Sister    Diabetes Sister    Asthma Brother    Colon polyps Brother    Heart disease Brother    Colon cancer Neg Hx      Social History   Occupational History   Occupation: Retired Pharmacist, hospital  Tobacco Use   Smoking status: Never   Smokeless tobacco: Never   Vaping Use   Vaping Use: Never used  Substance and Sexual Activity   Alcohol use: No    Alcohol/week: 0.0 standard drinks of alcohol   Drug use: No   Sexual activity: Not on file    Allergies  Allergen Reactions   Sulfa Antibiotics Rash   Cefdinir     REACTION: diarrhea   Other Itching   Roflumilast Diarrhea   Sulfonamide Derivatives Rash     Outpatient Medications Prior to Visit  Medication Sig Dispense Refill   albuterol (VENTOLIN HFA) 108 (90 Base) MCG/ACT inhaler Inhale 2 puffs into the lungs every 6 (six) hours as needed for wheezing or shortness of breath. 1 each 3   APPLE CIDER VINEGAR PO See admin instructions.     Ascorbic Acid (VITAMIN C) 100 MG tablet Take 100 mg by mouth daily.     aspirin 81 MG tablet Take 81 mg by mouth daily.     Cholecalciferol (VITAMIN D3) 250 MCG (10000 UT) capsule 2 capsules     ciclesonide (ALVESCO) 80 MCG/ACT inhaler Inhale 1 puff into the lungs 2 (two) times daily.     lansoprazole (PREVACID) 30 MG capsule Take 30 mg by mouth 2 (two) times daily before a meal.     metFORMIN (GLUCOPHAGE-XR) 500 MG 24 hr tablet      metoprolol (TOPROL-XL) 50 MG 24 hr tablet Take 75 mg by mouth daily.     montelukast (SINGULAIR) 10 MG tablet Take 10 mg by mouth at bedtime.     Omega-3 Fatty Acids (FISH OIL) 1200 MG CAPS Take 1 capsule by mouth daily.     Propylene Glycol 0.6 % SOLN Apply to eye.     rosuvastatin (CRESTOR) 5 MG tablet      spironolactone (ALDACTONE) 25 MG tablet      telmisartan (MICARDIS) 40 MG tablet TAKE 1 TABLET BY MOUTH EVERY DAY FOR BLOOD PRESSURE     valACYclovir (VALTREX) 500 MG tablet Take 1 tablet by mouth Daily.     OneTouch Delica Lancets [id number] MISC  (Patient not taking: Reported on 12/14/2021)     Facility-Administered Medications Prior to Visit  Medication Dose Route Frequency Provider Last Rate Last Admin   0.9 %  sodium chloride infusion  500 mL Intravenous Continuous Danis, Kirke Corin, MD        Review of Systems   Constitutional:  Positive for malaise/fatigue. Negative for chills, diaphoresis, fever and weight loss.  HENT:  Negative for congestion.   Respiratory:  Negative for cough, hemoptysis, sputum production, shortness of breath and wheezing.   Cardiovascular:  Negative for chest pain, palpitations and leg swelling.  Gastrointestinal:  Positive for heartburn.    Objective:   Vitals:   12/14/21 0930  BP: 110/60  Pulse: 65  SpO2: 98%  Weight: 136 lb (61.7 kg)  Height: 5\' 2"  (1.575 m)  SpO2: 98 % O2 Device: None (Room air)  Physical Exam: General: Well-appearing, no acute distress HENT: [place], AT Eyes: EOMI, no scleral icterus Respiratory: Clear to auscultation bilaterally.  No crackles, wheezing or rales Cardiovascular: RRR, -M/R/G, no JVD Extremities:-Edema,-tenderness Neuro: AAO x4, CNII-XII grossly intact Psych: Normal mood, normal affect  Data Reviewed:  Imaging: CXR 12/28/19 - Normal CXR. No infiltrates, effusion or edema. No hilar adenopathy   PFT: 01/22/20 FVC 2.12 (97%) FEV1 1.86 (110%) Ratio 86  TLC 76% DLCO 86% Interpretation: Mild restrictive defect with normal gas exchange.  07/28/21 FVC 1.97 (91%) FEV1 1.76 (106%) Ratio 86  TLC 75% DLCO 67% Interpretation: Mild restrictive defect with reduction in DLCO compared to 01/22/20. Clinically correlate   Labs: CBC    Component Value Date/Time   WBC 6.5 05/14/2010 0934   RBC 4.31 05/14/2010 0934   HGB 14.0 09/03/2010 1253   HCT 39.8 05/14/2010 0934   PLT 213.0 05/14/2010 0934   MCV 92.3 05/14/2010 0934   MCHC 34.5 05/14/2010 0934   RDW 12.7 05/14/2010 0934   LYMPHSABS 2.7 05/14/2010 0934   MONOABS 0.5 05/14/2010 0934   EOSABS 0.5 05/14/2010 0934   BASOSABS 0.0 05/14/2010 0934   BMET    Component Value Date/Time   NA 142 09/02/2010 1330   K 4.3 09/02/2010 1330   CL 105 09/02/2010 1330   CO2 31 09/02/2010 1330   GLUCOSE 87 09/02/2010 1330   BUN 11 09/02/2010 1330   CREATININE 0.81 09/02/2010 1330    CALCIUM 9.7 09/02/2010 1330   GFRNONAA >60 09/02/2010 1330   GFRAA >60 09/02/2010 1330       No data to display          Assessment & Plan:   Discussion: 71 year old female with remote hx of extrapulmonary sarcoid involving eye, asthma and laryngeal pharyngeal reflux who presents for follow-up. Continues to have fatigue however unclear if this is related to her sleep disorder vs sarcoid relapse, recommend working up the former first. Her symptoms are likely exacerbated by her COVID long hauler symptoms from July.    We reviewed clinical course of COVID-19 including long-term complications including post-inflammatory lung disease and long hauler symptoms. Encouraged pulmonary rehab  We discussed the clinical course of sarcoid and management including serial PFTs, labs, eye exam, and EKG and chest imaging if indicated. If symptoms suggest sarcoid flare in the future, we would manage with steroids +/- biologics.   COVID long hauler - dx in July 2023 Intermittent asthma - well-controlled --CONTINUE Alvesco 80 mcg ONE puff twice a day --CONTINUE montelukast 10 mg daily --REFER to pulmonary rehab  Reduced DLCO ORDER pulmonary function test in 3 months If decreasing, will need further work-up including CT chest and echocardiogram  REM sleep behavior disorder --RE-REFER to Sleep physician to further management with Dr. Halford Chessman  Fatigue --SEND message to Dr. Halford Chessman for follow-up --If symptoms unresolved with sleep management, will order PET/CT to rule out sarcoid flare  History of sarcoid with eye involvement Mild restrictive defect Mild reduction DLCO reduction --Diagnosed by her ophthalmologist >10 years ago --No indication for prednisone therapy --Annual PFTs. Last 07/28/21 --Recommend annual ophthalmology exam. Last summer 2023.  --EKG showed sinus bradycardia.   Sinus bradycardia --EKG reviewed and confirmed SB  Laryngopharyngeal Reflux --Previously followed by ENT at [facility] with Dr. Redmond Baseman --Continue Lansoprazole 30 mg BID   Health Maintenance Immunization History  Administered Date(s) Administered   Influenza Split 02/24/2009, 01/14/2014, 12/14/2014, 01/15/2015, 12/11/2015, 11/30/2016, 11/19/2017,  01/01/2019, 12/06/2019   Influenza Whole 01/13/2009, 12/22/2009   Influenza, High Dose Seasonal PF 12/31/2020   Influenza-Unspecified 01/21/2011   PFIZER(Purple Top)SARS-COV-2 Vaccination 04/19/2019, 05/10/2019, 12/24/2019   Pneumococcal Conjugate-13 04/17/2014   Pneumococcal Polysaccharide-23 04/17/2015   Td 06/15/2005   Tdap 09/11/2013   Zoster, Live 10/14/2014, 12/31/2020, 04/02/2021   CT Lung Screen - not qualified. Never smoker  Orders Placed This Encounter  Procedures   AMB referral to pulmonary rehabilitation    Referral Priority:   Routine    Referral Type:   Consultation    Number of Visits Requested:   1   No orders of the defined types were placed in this encounter.   Return in about 3 months (around 03/16/2022).  I have spent a total time of 32-minutes on the day of the appointment including chart review, data review, collecting history, coordinating care and discussing medical diagnosis and plan with the patient/family. Past medical history, allergies, medications were reviewed. Pertinent imaging, labs and tests included in this note have been reviewed and interpreted independently by me.  [place], MD [facility] 12/14/2021 11:04 AM  Office Number [phone]

## 2021-12-23 DIAGNOSIS — Z6824 Body mass index (BMI) 24.0-24.9, adult: Secondary | ICD-10-CM | POA: Diagnosis not present

## 2021-12-23 DIAGNOSIS — Z124 Encounter for screening for malignant neoplasm of cervix: Secondary | ICD-10-CM | POA: Diagnosis not present

## 2021-12-24 ENCOUNTER — Telehealth (HOSPITAL_COMMUNITY): Payer: Self-pay

## 2021-12-24 ENCOUNTER — Encounter (HOSPITAL_COMMUNITY): Payer: Self-pay

## 2021-12-24 NOTE — Telephone Encounter (Signed)
Attempted to call patient in regards to Pulmonary Rehab - LM on VM Mailed letter 

## 2021-12-30 NOTE — Progress Notes (Unsigned)
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 I had worked with her for asthma, cough with occular sarcoid, last in 2012. Followed now for general pulmonary by Dr Loanne Drilling. Saw BW, NP 10/2 noting potential options include melatonin, clonazepam. Medical problem list includes  HTN, Allergic Rhinitis, Asthma, GERD, Covid long-haul, Sarcoid,  NPSG 08/27/21- AHI 0.0/ hr, desat to 92%, no RBD noted, did talk,  dx snoring Epworth score- 14 Body weight today-134 lbs Covid vax-3 Phizer Flu vax-had She has a fianc who stays with at night with her sometimes and they travel together.  She remembers sleepwalking in childhood and says it slowed down in the middle years but never entirely stopped.  Whether it is really worse now or if she simply has an observer is unclear but there has not been a dramatic pattern change.  She is aware of frequent dreaming.  She does not clearly associate parasomnia/sleepwalking with dreams and denies violence or self-harm.  Often she sings, talks, moves around, may throw things and may open doors.  To protect her self she has put barriers in front of the doors.  Some dreams are "scary" especially if she had seen something troubling on TV before going to bed.  She denies punching, kicking or attacking bed partner. Physically comfortable in bed.  No sleep medication until she started melatonin 5 mg on direction of NP visit.  Occasional chocolate but not a caffeine user. Little alcohol.  Denies street drugs.  No history of seizure.  No family history of Parkinson's or dementia. With melatonin now she is less active at night but still talks and sings.  Asleep quickly.   Has had COVID infection twice.  Her second time, in July, 2023, left her feeling less energy.  Sleepier in day.  Takes half hour naps.  Prior to Admission  medications   Medication Sig Start Date End Date Taking? Authorizing Provider  albuterol (VENTOLIN HFA) 108 (90 Base) MCG/ACT inhaler Inhale 2 puffs into the lungs every 6 (six) hours as needed for wheezing or shortness of breath. 12/28/19  Yes Margaretha Seeds, MD  APPLE CIDER VINEGAR PO See admin instructions.   Yes [provider]  Ascorbic Acid (VITAMIN C) 100 MG tablet Take 100 mg by mouth daily.   Yes [provider]  aspirin 81 MG tablet Take 81 mg by mouth daily.   Yes [provider]  Cholecalciferol (VITAMIN D3) 250 MCG (10000 UT) capsule 2 capsules   Yes [provider]  ciclesonide (ALVESCO) 80 MCG/ACT inhaler Inhale 1 puff into the lungs 2 (two) times daily.   Yes [provider]  lansoprazole (PREVACID) 30 MG capsule Take 30 mg by mouth 2 (two) times daily before a meal.   Yes [provider]  metFORMIN (GLUCOPHAGE-XR) 500 MG 24 hr tablet  10/16/19  Yes [provider]  metoprolol (TOPROL-XL) 50 MG 24 hr tablet Take 75 mg by mouth daily.   Yes [provider]  montelukast (SINGULAIR) 10 MG tablet Take 10 mg by mouth at bedtime.   Yes [provider]  Omega-3 Fatty Acids (FISH OIL) 1200 MG CAPS Take 1 capsule by mouth daily.   Yes [provider]  OneTouch Delica Lancets [id number] [place]  12/15/19  Yes [provider]  Propylene Glycol 0.6 % SOLN Apply to eye.   Yes [provider]  rosuvastatin (CRESTOR) 5 MG tablet  12/26/19  Yes [provider]  spironolactone (ALDACTONE) 25 MG tablet  10/16/19  Yes [provider]  telmisartan (MICARDIS) 40 MG tablet TAKE 1 TABLET BY MOUTH EVERY DAY FOR BLOOD PRESSURE 04/12/18  Yes [provider]  valACYclovir (VALTREX) 500 MG tablet Take 1 tablet by mouth Daily. 07/01/10  Yes [provider]   Past Medical History:  Diagnosis Date   Allergic rhinitis, cause unspecified    Allergy    seasonal    Anxiety    sleep  walking/night terrors   Asthma    inhalers   BRONCHITIS, ACUTE 04/29/2009   Qualifier: Diagnosis of  By: Annamaria Boots MD, Laquitha Heslin D    GERD (gastroesophageal reflux disease)    Hypertension    Sarcoid    Past Surgical History:  Procedure Laterality Date   DEBRIDEMENT TOE     corns removed   EXTERNAL FIXATION OF FINGER     TONSILLECTOMY     Family History  Problem Relation Age of Onset   Asthma Mother    Colon polyps Mother    Diabetes Mother    Heart disease Mother    Stroke Mother    Kidney disease Mother    Asthma Maternal Grandmother    Asthma Maternal Grandfather    Asthma Paternal Grandfather    Asthma Paternal Grandmother    Asthma Sister    Breast cancer Sister    Diabetes Sister    Asthma Brother    Colon polyps Brother    Heart disease Brother    Colon cancer Neg Hx    Social History   Socioeconomic History   Marital status: Divorced    Spouse name: Not on file   Number of children: Not on file   Years of education: Not on file   Highest education level: Not on file  Occupational History   Occupation: Retired Pharmacist, hospital  Tobacco Use   Smoking status: Never   Smokeless tobacco: Never  Vaping Use   Vaping Use: Never used  Substance and Sexual Activity   Alcohol use: No    Alcohol/week: 0.0 standard drinks of alcohol   Drug use: No   Sexual activity: Not on file  Other Topics Concern   Not on file  Social History Narrative   Right handed   Drinks caffeine   2 story home   Social Determinants of Health   Financial Resource Strain: Not on file  Food Insecurity: Not on file  Transportation Needs: Not on file  Physical Activity: Not on file  Stress: Not on file  Social Connections: Not on file  Intimate Partner Violence: Not on file   ROS-see HPI   + = positive Constitutional:    weight loss, night sweats, fevers, chills, fatigue, lassitude. HEENT:    headaches, difficulty swallowing, tooth/dental problems, sore throat,       sneezing, itching, ear  ache, +nasal congestion, post nasal drip, snoring CV:    chest pain, orthopnea, PND, swelling in lower extremities, anasarca,                  dizziness, +palpitations Resp:   +shortness of breath with exertion or at rest.                productive cough,   non-productive cough, coughing up of blood.              change in color of mucus.  wheezing.   Skin:  rash or lesions. GI:  +heartburn, indigestion, abdominal pain, nausea, vomiting, diarrhea,                 change in bowel habits, loss of appetite GU: dysuria, change in color of urine, no urgency or frequency.   flank pain. MS:   joint pain, stiffness, decreased range of motion, back pain. Neuro-     nothing unusual Psych:  change in mood or affect.  depression or anxiety.   memory loss.  OBJ- Physical Exam General- Alert, Oriented, Affect-appropriate, Distress- none acute Skin- rash-none, lesions- none, excoriation- none Lymphadenopathy- none Head- atraumatic            Eyes- Gross vision intact, PERRLA, conjunctivae and secretions clear            Ears- Hearing, canals-normal            Nose- Clear, no-Septal dev, mucus, polyps, erosion, perforation             Throat- Mallampati II , mucosa clear , drainage- none, tonsils- atrophic Neck- flexible , trachea midline, no stridor , thyroid nl, carotid no bruit Chest - symmetrical excursion , unlabored           Heart/CV- RRR , no murmur , no gallop  , no rub, nl s1 s2                           - JVD- none , edema- none, stasis changes- none, varices- none           Lung- clear to P&A, wheeze- none, cough- none , dullness-none, rub- none           Chest wall-  Abd-  Br/ Gen/ Rectal- Not done, not indicated Extrem- cyanosis- none, clubbing, none, atrophy- none, strength- nl Neuro- grossly intact to observation

## 2021-12-31 ENCOUNTER — Encounter: Payer: Self-pay | Admitting: Internal Medicine

## 2021-12-31 ENCOUNTER — Ambulatory Visit (INDEPENDENT_AMBULATORY_CARE_PROVIDER_SITE_OTHER): Payer: Medicare PPO | Admitting: Internal Medicine

## 2021-12-31 DIAGNOSIS — F513 Sleepwalking [somnambulism]: Secondary | ICD-10-CM

## 2021-12-31 DIAGNOSIS — J4530 Mild persistent asthma, uncomplicated: Secondary | ICD-10-CM

## 2021-12-31 NOTE — Assessment & Plan Note (Signed)
This is persisted since childhood.  Unclear if it is really worse now or simply more awareness because she has an observer.  She is divorced but does not indicate there was a big problem with it while she was married.  There does not seem to have been any long interval of her life but it stopped completely and there does not seem to be a definite pattern change otherwise.  She does notice some vivid and sometimes uncomfortable dreams but is not attacking bed partner, striking out or kicking. Plan-she is tolerating melatonin and it seems to help some so we will try higher dose.  Consider clonazepam if needed.

## 2021-12-31 NOTE — Assessment & Plan Note (Signed)
Currently clear and she does not report any concerns.

## 2021-12-31 NOTE — Patient Instructions (Signed)
For sleep walking- try increasing melatonin to 10 mg at bedtime  Please let me know if I can help

## 2022-01-13 DIAGNOSIS — M2042 Other hammer toe(s) (acquired), left foot: Secondary | ICD-10-CM | POA: Diagnosis not present

## 2022-01-13 DIAGNOSIS — L84 Corns and callosities: Secondary | ICD-10-CM | POA: Diagnosis not present

## 2022-01-13 DIAGNOSIS — E119 Type 2 diabetes mellitus without complications: Secondary | ICD-10-CM | POA: Diagnosis not present

## 2022-01-13 DIAGNOSIS — M2041 Other hammer toe(s) (acquired), right foot: Secondary | ICD-10-CM | POA: Diagnosis not present

## 2022-01-19 ENCOUNTER — Telehealth (HOSPITAL_COMMUNITY): Payer: Self-pay

## 2022-01-19 NOTE — Telephone Encounter (Signed)
No response from pt.  Closed referral  

## 2022-01-27 ENCOUNTER — Telehealth (HOSPITAL_COMMUNITY): Payer: Self-pay

## 2022-01-27 NOTE — Telephone Encounter (Signed)
Pt returned phone call and stated that she is interested in the pulmonary rehab program but she travels all the time and will be gone majority of the month December. I advised pt to give [place] a call when she is ready to schedule.

## 2022-01-28 DIAGNOSIS — Z23 Encounter for immunization: Secondary | ICD-10-CM | POA: Diagnosis not present

## 2022-01-28 DIAGNOSIS — E7800 Pure hypercholesterolemia, unspecified: Secondary | ICD-10-CM | POA: Diagnosis not present

## 2022-01-28 DIAGNOSIS — J309 Allergic rhinitis, unspecified: Secondary | ICD-10-CM | POA: Diagnosis not present

## 2022-01-28 DIAGNOSIS — N183 Chronic kidney disease, stage 3 unspecified: Secondary | ICD-10-CM | POA: Diagnosis not present

## 2022-01-28 DIAGNOSIS — Z0000 Encounter for general adult medical examination without abnormal findings: Secondary | ICD-10-CM | POA: Diagnosis not present

## 2022-01-28 DIAGNOSIS — I10 Essential (primary) hypertension: Secondary | ICD-10-CM | POA: Diagnosis not present

## 2022-01-28 DIAGNOSIS — E1169 Type 2 diabetes mellitus with other specified complication: Secondary | ICD-10-CM | POA: Diagnosis not present

## 2022-01-28 DIAGNOSIS — Z1331 Encounter for screening for depression: Secondary | ICD-10-CM | POA: Diagnosis not present

## 2022-01-28 DIAGNOSIS — J45909 Unspecified asthma, uncomplicated: Secondary | ICD-10-CM | POA: Diagnosis not present

## 2022-02-22 ENCOUNTER — Telehealth (HOSPITAL_COMMUNITY): Payer: Self-pay

## 2022-02-22 NOTE — Telephone Encounter (Signed)
Pt called and stated she is not interested in PR.    Patient will come in for orientation on 02/26/22 @ 10:30AM and will attend the 1:15PM exercise class.   Pensions consultant.

## 2022-02-22 NOTE — Telephone Encounter (Signed)
Pt insurance is active and benefits verified through [facility]. Co-pay $20.00, DED $0.00/$0.00 met, out of pocket $4,000.00/$340.00 met, co-insurance 0%. No pre-authorization required. [place] J./[employer], 02/22/22 @ 12:06PM, [phone]

## 2022-02-23 DIAGNOSIS — E119 Type 2 diabetes mellitus without complications: Secondary | ICD-10-CM | POA: Diagnosis not present

## 2022-02-23 DIAGNOSIS — I739 Peripheral vascular disease, unspecified: Secondary | ICD-10-CM | POA: Diagnosis not present

## 2022-02-23 DIAGNOSIS — M2041 Other hammer toe(s) (acquired), right foot: Secondary | ICD-10-CM | POA: Diagnosis not present

## 2022-02-23 DIAGNOSIS — L84 Corns and callosities: Secondary | ICD-10-CM | POA: Diagnosis not present

## 2022-02-23 DIAGNOSIS — M2042 Other hammer toe(s) (acquired), left foot: Secondary | ICD-10-CM | POA: Diagnosis not present

## 2022-02-26 ENCOUNTER — Encounter (HOSPITAL_COMMUNITY)
Admission: RE | Admit: 2022-02-26 | Discharge: 2022-02-26 | Disposition: A | Discharge status: Home / Self Care | Payer: Medicare PPO | Source: Ambulatory Visit | Attending: Pulmonary Disease | Admitting: Pulmonary Disease

## 2022-02-26 ENCOUNTER — Encounter (HOSPITAL_COMMUNITY): Payer: Self-pay

## 2022-02-26 VITALS — BP 94/59 | HR 61 | Ht 62.0 in | Wt 135.6 lb

## 2022-02-26 DIAGNOSIS — U099 Post covid-19 condition, unspecified: Secondary | ICD-10-CM | POA: Insufficient documentation

## 2022-02-26 DIAGNOSIS — R0609 Other forms of dyspnea: Secondary | ICD-10-CM | POA: Diagnosis not present

## 2022-02-26 LAB — GLUCOSE, CAPILLARY: Glucose-Capillary: 99 mg/dL (ref 70–99)

## 2022-02-26 NOTE — Progress Notes (Signed)
Pulmonary Rehab Orientation Physical Assessment Note  Physical assessment reveals  Pt is alert and oriented. Breath sounds clear to auscultation, no wheezes, rales, or rhonchi. Reports non-productive cough.  Pt does not report any nausea, vomiting or diarrhea, admits to not having much of an appetite and reports has always been that way. Grip strength equal, strong with radial pulses palpable. No swelling to lower extremities.

## 2022-02-26 NOTE — Progress Notes (Signed)
Jessica Woodard 71 y.o. female  Initial Psychosocial Assessment  Pt psychosocial assessment reveals pt lives alone. Pt is currently retired. Pt hobbies include gardening, reading, listening to music, and volunteering. Pt reports her stress level is low. Areas of stress/anxiety include N/A.  Pt does not exhibit signs of depression. Pt shows good  coping skills with positive outlook . Offered emotional support and reassurance. Will continue to monitor.   02/26/2022 3:00 PM

## 2022-02-26 NOTE — Progress Notes (Addendum)
Pulmonary Individual Treatment Plan  Patient Details  Name: SRISHTI STRNAD MRN: [id number] Date of Birth: 1951/02/27 Referring Provider:   April Manson Pulmonary Rehab Walk Test from 02/26/2022 in [facility] [place]  Referring Provider Loanne Drilling       Initial Encounter Date:  Flowsheet Row Pulmonary Rehab Walk Test from 02/26/2022 in [facility] [place]  Date 02/26/22       Visit Diagnosis: DOE (dyspnea on exertion)  Post covid-19 condition, unspecified  Patient's Home Medications on Admission:   Current Outpatient Medications:    albuterol (VENTOLIN HFA) 108 (90 Base) MCG/ACT inhaler, Inhale 2 puffs into the lungs every 6 (six) hours as needed for wheezing or shortness of breath., Disp: 1 each, Rfl: 3   APPLE CIDER VINEGAR PO, See admin instructions., Disp: , Rfl:    Ascorbic Acid (VITAMIN C) 100 MG tablet, Take 100 mg by mouth daily., Disp: , Rfl:    aspirin 81 MG tablet, Take 81 mg by mouth daily., Disp: , Rfl:    Cholecalciferol (VITAMIN D3) 250 MCG (10000 UT) capsule, 2 capsules, Disp: , Rfl:    ciclesonide (ALVESCO) 80 MCG/ACT inhaler, Inhale 1 puff into the lungs 2 (two) times daily., Disp: , Rfl:    cyanocobalamin (VITAMIN B12) 1000 MCG tablet, Take 1,000 mcg by mouth daily., Disp: , Rfl:    lansoprazole (PREVACID) 30 MG capsule, Take 30 mg by mouth 2 (two) times daily before a meal., Disp: , Rfl:    Melatonin 5 MG/15ML LIQD, Take 5 mg by mouth at bedtime., Disp: , Rfl:    metFORMIN (GLUCOPHAGE-XR) 500 MG 24 hr tablet, , Disp: , Rfl:    metoprolol (TOPROL-XL) 50 MG 24 hr tablet, Take 75 mg by mouth daily., Disp: , Rfl:    Misc Natural Products (ELDERBERRY IMMUNE COMPLEX PO), Take 50 mg by mouth daily., Disp: , Rfl:    montelukast (SINGULAIR) 10 MG tablet, Take 10 mg by mouth at bedtime., Disp: , Rfl:    Omega-3 Fatty Acids (FISH OIL) 1200 MG CAPS, Take 1 capsule by mouth  daily., Disp: , Rfl:    OneTouch Delica Lancets 84X MISC, , Disp: , Rfl:    Propylene Glycol 0.6 % SOLN, Apply to eye., Disp: , Rfl:    rosuvastatin (CRESTOR) 5 MG tablet, , Disp: , Rfl:    spironolactone (ALDACTONE) 25 MG tablet, , Disp: , Rfl:    telmisartan (MICARDIS) 40 MG tablet, TAKE 1 TABLET BY MOUTH EVERY DAY FOR BLOOD PRESSURE, Disp: , Rfl:    valACYclovir (VALTREX) 500 MG tablet, Take 1 tablet by mouth Daily., Disp: , Rfl:   Past Medical History: Past Medical History:  Diagnosis Date   Allergic rhinitis, cause unspecified    Allergy    seasonal    Anxiety    sleep walking/night terrors   Asthma    inhalers   BRONCHITIS, ACUTE 04/29/2009   Qualifier: Diagnosis of  By: Annamaria Boots MD, Clinton D    GERD (gastroesophageal reflux disease)    Hypertension    Sarcoid     Tobacco Use: Social History   Tobacco Use  Smoking Status Never  Smokeless Tobacco Never    Labs: Review Flowsheet        No data to display          Capillary Blood Glucose: Lab Results  Component Value Date   GLUCAP 99 02/26/2022     Pulmonary  Assessment Scores:  Pulmonary Assessment Scores     Row Name 02/26/22 1122         ADL UCSD   ADL Phase Entry     SOB Score total 65       CAT Score   CAT Score 24       mMRC Score   mMRC Score 2             UCSD: Self-administered rating of dyspnea associated with activities of daily living (ADLs) 6-point scale (0 = "not at all" to 5 = "maximal or unable to do because of breathlessness")  Scoring Scores range from 0 to 120.  Minimally important difference is 5 units  CAT: CAT can identify the health impairment of COPD patients and is better correlated with disease progression.  CAT has a scoring range of zero to 40. The CAT score is classified into four groups of low (less than 10), medium (10 - 20), high (21-30) and very high (31-40) based on the impact level of disease on health status. A CAT score over 10 suggests significant  symptoms.  A worsening CAT score could be explained by an exacerbation, poor medication adherence, poor inhaler technique, or progression of COPD or comorbid conditions.  CAT MCID is 2 points  mMRC: mMRC (Modified Medical Research Council) Dyspnea Scale is used to assess the degree of baseline functional disability in patients of respiratory disease due to dyspnea. No minimal important difference is established. A decrease in score of 1 point or greater is considered a positive change.   Pulmonary Function Assessment:  Pulmonary Function Assessment - 02/26/22 1455       Breath   Bilateral Breath Sounds Clear    Shortness of Breath Yes;Limiting activity             Exercise Target Goals: Exercise Program Goal: Individual exercise prescription set using results from initial 6 min walk test and THRR while considering  patient's activity barriers and safety.   Exercise Prescription Goal: Initial exercise prescription builds to 30-45 minutes a day of aerobic activity, 2-3 days per week.  Home exercise guidelines will be given to patient during program as part of exercise prescription that the participant will acknowledge.  Activity Barriers & Risk Stratification:  Activity Barriers & Cardiac Risk Stratification - 02/26/22 1123       Activity Barriers & Cardiac Risk Stratification   Activity Barriers Deconditioning;Muscular Weakness;Shortness of Breath;Balance Concerns;History of Falls             6 Minute Walk:  6 Minute Walk     Row Name 02/26/22 1450         6 Minute Walk   Phase Initial     Distance 960 feet     Walk Time 6 minutes     # of Rest Breaks 0     MPH 1.82     METS 1.93     RPE 13     Perceived Dyspnea  2     VO2 Peak 6.75     Symptoms No     Resting HR 61 bpm     Resting BP 94/59     Resting Oxygen Saturation  98 %     Exercise Oxygen Saturation  during 6 min walk 94 %     Max Ex. HR 75 bpm     Max Ex. BP 110/62     2 Minute Post BP 107/64        Interval HR  1 Minute HR 57     2 Minute HR 70     3 Minute HR 74     4 Minute HR 75     5 Minute HR 75     6 Minute HR 74     2 Minute Post HR 56     Interval Heart Rate? Yes       Interval Oxygen   Interval Oxygen? Yes     Baseline Oxygen Saturation % 98 %     1 Minute Oxygen Saturation % 100 %     1 Minute Liters of Oxygen 0 L     2 Minute Oxygen Saturation % 100 %     2 Minute Liters of Oxygen 0 L     3 Minute Oxygen Saturation % 97 %     3 Minute Liters of Oxygen 0 L     4 Minute Oxygen Saturation % 95 %     4 Minute Liters of Oxygen 0 L     5 Minute Oxygen Saturation % 94 %     5 Minute Liters of Oxygen 0 L     6 Minute Oxygen Saturation % 98 %     6 Minute Liters of Oxygen 0 L     2 Minute Post Oxygen Saturation % 100 %     2 Minute Post Liters of Oxygen 0 L              Oxygen Initial Assessment:  Oxygen Initial Assessment - 02/26/22 1120       Home Oxygen   Home Oxygen Device None    Sleep Oxygen Prescription None    Home Exercise Oxygen Prescription None    Home Resting Oxygen Prescription None      Initial 6 min Walk   Oxygen Used None      Program Oxygen Prescription   Program Oxygen Prescription None      Intervention   Short Term Goals To learn and understand importance of maintaining oxygen saturations>88%;To learn and demonstrate proper use of respiratory medications;To learn and understand importance of monitoring SPO2 with pulse oximeter and demonstrate accurate use of the pulse oximeter.;To learn and demonstrate proper pursed lip breathing techniques or other breathing techniques.     Long  Term Goals Verbalizes importance of monitoring SPO2 with pulse oximeter and return demonstration;Maintenance of O2 saturations>88%;Exhibits proper breathing techniques, such as pursed lip breathing or other method taught during program session;Compliance with respiratory medication;Demonstrates proper use of MDI's             Oxygen  Re-Evaluation:   Oxygen Discharge (Final Oxygen Re-Evaluation):   Initial Exercise Prescription:  Initial Exercise Prescription - 02/26/22 1400       Date of Initial Exercise RX and Referring Provider   Date 02/26/22    Referring Provider Loanne Drilling    Expected Discharge Date 05/06/22      Recumbant Bike   Level 1    Watts 20    Minutes 15      NuStep   Level 1    SPM 60    Minutes 15      Prescription Details   Frequency (times per week) 2    Duration Progress to 30 minutes of continuous aerobic without signs/symptoms of physical distress      Intensity   THRR 40-80% of Max Heartrate 60-119    Ratings of Perceived Exertion 11-13    Perceived Dyspnea 0-4      Progression  Progression Continue to progress workloads to maintain intensity without signs/symptoms of physical distress.      Resistance Training   Training Prescription Yes    Weight red bands    Reps 10-15             Perform Capillary Blood Glucose checks as needed.  Exercise Prescription Changes:   Exercise Comments:   Exercise Goals and Review:   Exercise Goals     Row Name 02/26/22 1454             Exercise Goals   Increase Physical Activity Yes       Intervention Provide advice, education, support and counseling about physical activity/exercise needs.;Develop an individualized exercise prescription for aerobic and resistive training based on initial evaluation findings, risk stratification, comorbidities and participant's personal goals.       Expected Outcomes Short Term: Attend rehab on a regular basis to increase amount of physical activity.;Long Term: Exercising regularly at least 3-5 days a week.;Long Term: Add in home exercise to make exercise part of routine and to increase amount of physical activity.       Increase Strength and Stamina Yes       Intervention Provide advice, education, support and counseling about physical activity/exercise needs.;Develop an individualized  exercise prescription for aerobic and resistive training based on initial evaluation findings, risk stratification, comorbidities and participant's personal goals.       Expected Outcomes Short Term: Increase workloads from initial exercise prescription for resistance, speed, and METs.;Short Term: Perform resistance training exercises routinely during rehab and add in resistance training at home;Long Term: Improve cardiorespiratory fitness, muscular endurance and strength as measured by increased METs and functional capacity (6MWT)       Able to understand and use rate of perceived exertion (RPE) scale Yes       Intervention Provide education and explanation on how to use RPE scale       Expected Outcomes Short Term: Able to use RPE daily in rehab to express subjective intensity level;Long Term:  Able to use RPE to guide intensity level when exercising independently       Able to understand and use Dyspnea scale Yes       Intervention Provide education and explanation on how to use Dyspnea scale       Expected Outcomes Short Term: Able to use Dyspnea scale daily in rehab to express subjective sense of shortness of breath during exertion;Long Term: Able to use Dyspnea scale to guide intensity level when exercising independently       Knowledge and understanding of Target Heart Rate Range (THRR) Yes       Intervention Provide education and explanation of THRR including how the numbers were predicted and where they are located for reference       Expected Outcomes Short Term: Able to state/look up THRR;Long Term: Able to use THRR to govern intensity when exercising independently;Short Term: Able to use daily as guideline for intensity in rehab       Understanding of Exercise Prescription Yes       Intervention Provide education, explanation, and written materials on patient's individual exercise prescription       Expected Outcomes Short Term: Able to explain program exercise prescription;Long Term: Able  to explain home exercise prescription to exercise independently                Exercise Goals Re-Evaluation :   Discharge Exercise Prescription (Final Exercise Prescription Changes):   Nutrition:  Target Goals: Understanding of nutrition guidelines, daily intake of sodium <1556m, cholesterol <2022m calories 30% from fat and 7% or less from saturated fats, daily to have 5 or more servings of fruits and vegetables.  Biometrics:  Pre Biometrics - 02/26/22 1039       Pre Biometrics   Height _0  (1.575 m)    BMI (Calculated) 24.79              Nutrition Therapy Plan and Nutrition Goals:   Nutrition Assessments:  MEDIFICTS Score Key: ?70 Need to make dietary changes  40-70 Heart Healthy Diet ? 40 Therapeutic Level Cholesterol Diet   Picture Your Plate Scores: <4<95nhealthy dietary pattern with much room for improvement. 41-50 Dietary pattern unlikely to meet recommendations for good health and room for improvement. 51-60 More healthful dietary pattern, with some room for improvement.  >60 Healthy dietary pattern, although there may be some specific behaviors that could be improved.    Nutrition Goals Re-Evaluation:   Nutrition Goals Discharge (Final Nutrition Goals Re-Evaluation):   Psychosocial: Target Goals: Acknowledge presence or absence of significant depression and/or stress, maximize coping skills, provide positive support system. Participant is able to verbalize types and ability to use techniques and skills needed for reducing stress and depression.  Initial Review & Psychosocial Screening:  Initial Psych Review & Screening - 02/26/22 1111       Initial Review   Current issues with None Identified      Family Dynamics   Good Support System? Yes    Comments Fiance, Daughter      Barriers   Psychosocial barriers to participate in program There are no identifiable barriers or psychosocial needs.      Screening Interventions   Interventions  Encouraged to exercise             Quality of Life Scores:  Scores of 19 and below usually indicate a poorer quality of life in these areas.  A difference of  2-3 points is a clinically meaningful difference.  A difference of 2-3 points in the total score of the Quality of Life Index has been associated with significant improvement in overall quality of life, self-image, physical symptoms, and general health in studies assessing change in quality of life.  PHQ-9: Review Flowsheet       02/26/2022  Depression screen PHQ 2/9  Decreased Interest 0  Down, Depressed, Hopeless 0  PHQ - 2 Score 0  Altered sleeping 0  Tired, decreased energy 0  Change in appetite 0  Feeling bad or failure about yourself  0  Trouble concentrating 0  Moving slowly or fidgety/restless 0  Suicidal thoughts 0  PHQ-9 Score 0  Difficult doing work/chores Not difficult at all   Interpretation of Total Score  Total Score Depression Severity:  1-4 = Minimal depression, 5-9 = Mild depression, 10-14 = Moderate depression, 15-19 = Moderately severe depression, 20-27 = Severe depression   Psychosocial Evaluation and Intervention:  Psychosocial Evaluation - 02/26/22 1113       Psychosocial Evaluation & Interventions   Interventions Encouraged to exercise with the program and follow exercise prescription    Comments Pt denies psychosocial barriers.    Expected Outcomes For [facility] participate in PR free of psychosocial barriers    Continue Psychosocial Services  No Follow up required             Psychosocial Re-Evaluation:   Psychosocial Discharge (Final Psychosocial Re-Evaluation):   Education: Education Goals: Education classes will be  provided on a weekly basis, covering required topics. Participant will state understanding/return demonstration of topics presented.  Learning Barriers/Preferences:  Learning Barriers/Preferences - 02/26/22 1117       Learning Barriers/Preferences   Learning  Barriers Sight   reading glasses   Learning Preferences Skilled Demonstration;Pictoral;Video;Written Material             Education Topics: Introduction to Pulmonary Rehab Group instruction provided by PowerPoint, verbal discussion, and written material to support subject matter. Instructor reviews what Pulmonary Rehab is, the purpose of the program, and how patients are referred.     Know Your Numbers Group instruction that is supported by a PowerPoint presentation. Instructor discusses importance of knowing and understanding resting, exercise, and post-exercise oxygen saturation, heart rate, and blood pressure. Oxygen saturation, heart rate, blood pressure, rating of perceived exertion, and dyspnea are reviewed along with a normal range for these values.    Exercise for the Pulmonary Patient Group instruction that is supported by a PowerPoint presentation. Instructor discusses benefits of exercise, core components of exercise, frequency, duration, and intensity of an exercise routine, importance of utilizing pulse oximetry during exercise, safety while exercising, and options of places to exercise outside of rehab.       MET Level  Group instruction provided by PowerPoint, verbal discussion, and written material to support subject matter. Instructor reviews what METs are and how to increase METs.    Pulmonary Medications Verbally interactive group education provided by instructor with focus on inhaled medications and proper administration.   Anatomy and Physiology of the Respiratory System Group instruction provided by PowerPoint, verbal discussion, and written material to support subject matter. Instructor reviews respiratory cycle and anatomical components of the respiratory system and their functions. Instructor also reviews differences in obstructive and restrictive respiratory diseases with examples of each.    Oxygen Safety Group instruction provided by PowerPoint,  verbal discussion, and written material to support subject matter. There is an overview of "What is Oxygen" and "Why do we need it".  Instructor also reviews how to create a safe environment for oxygen use, the importance of using oxygen as prescribed, and the risks of noncompliance. There is a brief discussion on traveling with oxygen and resources the patient may utilize.   Oxygen Use Group instruction provided by PowerPoint, verbal discussion, and written material to discuss how supplemental oxygen is prescribed and different types of oxygen supply systems. Resources for more information are provided.    Breathing Techniques Group instruction that is supported by demonstration and informational handouts. Instructor discusses the benefits of pursed lip and diaphragmatic breathing and detailed demonstration on how to perform both.     Risk Factor Reduction Group instruction that is supported by a PowerPoint presentation. Instructor discusses the definition of a risk factor, different risk factors for pulmonary disease, and how the heart and lungs work together.   MD Day A group question and answer session with a medical doctor that allows participants to ask questions that relate to their pulmonary disease state.   Nutrition for the Pulmonary Patient Group instruction provided by PowerPoint slides, verbal discussion, and written materials to support subject matter. The instructor gives an explanation and review of healthy diet recommendations, which includes a discussion on weight management, recommendations for fruit and vegetable consumption, as well as protein, fluid, caffeine, fiber, sodium, sugar, and alcohol. Tips for eating when patients are short of breath are discussed.    Other Education Group or individual verbal, written, or video instructions that support  the educational goals of the pulmonary rehab program.    Knowledge Questionnaire Score:  Knowledge Questionnaire Score -  02/26/22 1456       Knowledge Questionnaire Score   Pre Score 16/18             Core Components/Risk Factors/Patient Goals at Admission:  Personal Goals and Risk Factors at Admission - 02/26/22 1117       Core Components/Risk Factors/Patient Goals on Admission   Improve shortness of breath with ADL's Yes    Intervention Provide education, individualized exercise plan and daily activity instruction to help decrease symptoms of SOB with activities of daily living.    Expected Outcomes Short Term: Improve cardiorespiratory fitness to achieve a reduction of symptoms when performing ADLs    Increase knowledge of respiratory medications and ability to use respiratory devices properly  Yes    Intervention Provide education and demonstration as needed of appropriate use of medications, inhalers, and oxygen therapy.    Expected Outcomes Short Term: Achieves understanding of medications use. Understands that oxygen is a medication prescribed by physician. Demonstrates appropriate use of inhaler and oxygen therapy.;Long Term: Maintain appropriate use of medications, inhalers, and oxygen therapy.             Core Components/Risk Factors/Patient Goals Review:    Core Components/Risk Factors/Patient Goals at Discharge (Final Review):    ITP Comments: Dr. Rodman Pickle is Medical Director for Pulmonary Rehab at [facility].

## 2022-02-26 NOTE — Progress Notes (Signed)
Jessica Woodard 71 y.o. female Pulmonary Rehab Orientation Note This patient who was referred to Pulmonary Rehab by Dr. Everardo All with the diagnosis of DOE; Post COVID-19 arrived today in Cardiac and Pulmonary Rehab. She  arrived ambulatory with assistive device with normal gait. She  does not carry portable oxygen. Per patient, Jessica Woodard uses oxygen never. Color good, skin warm and dry. Patient is oriented to time and place. Patient's medical history, psychosocial health, and medications reviewed. Psychosocial assessment reveals patient lives with alone. Jessica Woodard is currently retired. Patient hobbies include gardening, reading, listening to music, and volunteering. Patient reports her stress level is low. Areas of stress/anxiety include N/A. Patient  does not  exhibit signs of depression. PHQ2/9 score 0/0. Jessica Woodard shows good  coping skills with positive outlook on life. Offered emotional support and reassurance. Will continue to monitor. Physical assessment performed by Jessica Sine RN. Please see their orientation physical assessment note. Jessica Woodard reports she does take medications as prescribed. Patient states she follows a regular  diet. The patient reports no specific efforts to gain or lose weight.. Patient's weight will be monitored closely. Demonstration and practice of PLB using pulse oximeter. Jessica Woodard able to return demonstration satisfactorily. Safety and hand hygiene in the exercise area reviewed with patient. Jessica Woodard voices understanding of the information reviewed. Department expectations discussed with patient and achievable goals were set. The patient shows enthusiasm about attending the program and we look forward to working with Jessica Woodard. Jessica Woodard completed a 6 min walk test today and is scheduled to begin exercise on 03/04/22 at 1:15 pm.   1020-1200 Jessica San, MS, ACSM-CEP

## 2022-03-03 ENCOUNTER — Telehealth (HOSPITAL_COMMUNITY): Payer: Self-pay

## 2022-03-03 NOTE — Telephone Encounter (Signed)
Jessica Woodard called stating that she had a sore throat and congestion. Advised her to call her PCP or Pulmonologist to get advice from them. Jessica Woodard voiced understanding and agreed.

## 2022-03-03 NOTE — Progress Notes (Signed)
Pulmonary Individual Treatment Plan  Patient Details  Name: Jessica Woodard MRN: [id number] Date of Birth: 1951/02/27 Referring Provider:   April Manson Pulmonary Rehab Walk Test from 02/26/2022 in [facility] [place]  Referring Provider Loanne Drilling       Initial Encounter Date:  Flowsheet Row Pulmonary Rehab Walk Test from 02/26/2022 in [facility] [place]  Date 02/26/22       Visit Diagnosis: DOE (dyspnea on exertion)  Post covid-19 condition, unspecified  Patient's Home Medications on Admission:   Current Outpatient Medications:    albuterol (VENTOLIN HFA) 108 (90 Base) MCG/ACT inhaler, Inhale 2 puffs into the lungs every 6 (six) hours as needed for wheezing or shortness of breath., Disp: 1 each, Rfl: 3   APPLE CIDER VINEGAR PO, See admin instructions., Disp: , Rfl:    Ascorbic Acid (VITAMIN C) 100 MG tablet, Take 100 mg by mouth daily., Disp: , Rfl:    aspirin 81 MG tablet, Take 81 mg by mouth daily., Disp: , Rfl:    Cholecalciferol (VITAMIN D3) 250 MCG (10000 UT) capsule, 2 capsules, Disp: , Rfl:    ciclesonide (ALVESCO) 80 MCG/ACT inhaler, Inhale 1 puff into the lungs 2 (two) times daily., Disp: , Rfl:    cyanocobalamin (VITAMIN B12) 1000 MCG tablet, Take 1,000 mcg by mouth daily., Disp: , Rfl:    lansoprazole (PREVACID) 30 MG capsule, Take 30 mg by mouth 2 (two) times daily before a meal., Disp: , Rfl:    Melatonin 5 MG/15ML LIQD, Take 5 mg by mouth at bedtime., Disp: , Rfl:    metFORMIN (GLUCOPHAGE-XR) 500 MG 24 hr tablet, , Disp: , Rfl:    metoprolol (TOPROL-XL) 50 MG 24 hr tablet, Take 75 mg by mouth daily., Disp: , Rfl:    Misc Natural Products (ELDERBERRY IMMUNE COMPLEX PO), Take 50 mg by mouth daily., Disp: , Rfl:    montelukast (SINGULAIR) 10 MG tablet, Take 10 mg by mouth at bedtime., Disp: , Rfl:    Omega-3 Fatty Acids (FISH OIL) 1200 MG CAPS, Take 1 capsule by mouth  daily., Disp: , Rfl:    OneTouch Delica Lancets 84X MISC, , Disp: , Rfl:    Propylene Glycol 0.6 % SOLN, Apply to eye., Disp: , Rfl:    rosuvastatin (CRESTOR) 5 MG tablet, , Disp: , Rfl:    spironolactone (ALDACTONE) 25 MG tablet, , Disp: , Rfl:    telmisartan (MICARDIS) 40 MG tablet, TAKE 1 TABLET BY MOUTH EVERY DAY FOR BLOOD PRESSURE, Disp: , Rfl:    valACYclovir (VALTREX) 500 MG tablet, Take 1 tablet by mouth Daily., Disp: , Rfl:   Past Medical History: Past Medical History:  Diagnosis Date   Allergic rhinitis, cause unspecified    Allergy    seasonal    Anxiety    sleep walking/night terrors   Asthma    inhalers   BRONCHITIS, ACUTE 04/29/2009   Qualifier: Diagnosis of  By: Annamaria Boots MD, Clinton D    GERD (gastroesophageal reflux disease)    Hypertension    Sarcoid     Tobacco Use: Social History   Tobacco Use  Smoking Status Never  Smokeless Tobacco Never    Labs: Review Flowsheet        No data to display          Capillary Blood Glucose: Lab Results  Component Value Date   GLUCAP 99 02/26/2022     Pulmonary  Assessment Scores:  Pulmonary Assessment Scores     Row Name 02/26/22 1122         ADL UCSD   ADL Phase Entry     SOB Score total 65       CAT Score   CAT Score 24       mMRC Score   mMRC Score 2             UCSD: Self-administered rating of dyspnea associated with activities of daily living (ADLs) 6-point scale (0 = "not at all" to 5 = "maximal or unable to do because of breathlessness")  Scoring Scores range from 0 to 120.  Minimally important difference is 5 units  CAT: CAT can identify the health impairment of COPD patients and is better correlated with disease progression.  CAT has a scoring range of zero to 40. The CAT score is classified into four groups of low (less than 10), medium (10 - 20), high (21-30) and very high (31-40) based on the impact level of disease on health status. A CAT score over 10 suggests significant  symptoms.  A worsening CAT score could be explained by an exacerbation, poor medication adherence, poor inhaler technique, or progression of COPD or comorbid conditions.  CAT MCID is 2 points  mMRC: mMRC (Modified Medical Research Council) Dyspnea Scale is used to assess the degree of baseline functional disability in patients of respiratory disease due to dyspnea. No minimal important difference is established. A decrease in score of 1 point or greater is considered a positive change.   Pulmonary Function Assessment:  Pulmonary Function Assessment - 02/26/22 1455       Breath   Bilateral Breath Sounds Clear    Shortness of Breath Yes;Limiting activity             Exercise Target Goals: Exercise Program Goal: Individual exercise prescription set using results from initial 6 min walk test and THRR while considering  patient's activity barriers and safety.   Exercise Prescription Goal: Initial exercise prescription builds to 30-45 minutes a day of aerobic activity, 2-3 days per week.  Home exercise guidelines will be given to patient during program as part of exercise prescription that the participant will acknowledge.  Activity Barriers & Risk Stratification:  Activity Barriers & Cardiac Risk Stratification - 02/26/22 1123       Activity Barriers & Cardiac Risk Stratification   Activity Barriers Deconditioning;Muscular Weakness;Shortness of Breath;Balance Concerns;History of Falls             6 Minute Walk:  6 Minute Walk     Row Name 02/26/22 1450         6 Minute Walk   Phase Initial     Distance 960 feet     Walk Time 6 minutes     # of Rest Breaks 0     MPH 1.82     METS 1.93     RPE 13     Perceived Dyspnea  2     VO2 Peak 6.75     Symptoms No     Resting HR 61 bpm     Resting BP 94/59     Resting Oxygen Saturation  98 %     Exercise Oxygen Saturation  during 6 min walk 94 %     Max Ex. HR 75 bpm     Max Ex. BP 110/62     2 Minute Post BP 107/64        Interval HR  1 Minute HR 57     2 Minute HR 70     3 Minute HR 74     4 Minute HR 75     5 Minute HR 75     6 Minute HR 74     2 Minute Post HR 56     Interval Heart Rate? Yes       Interval Oxygen   Interval Oxygen? Yes     Baseline Oxygen Saturation % 98 %     1 Minute Oxygen Saturation % 100 %     1 Minute Liters of Oxygen 0 L     2 Minute Oxygen Saturation % 100 %     2 Minute Liters of Oxygen 0 L     3 Minute Oxygen Saturation % 97 %     3 Minute Liters of Oxygen 0 L     4 Minute Oxygen Saturation % 95 %     4 Minute Liters of Oxygen 0 L     5 Minute Oxygen Saturation % 94 %     5 Minute Liters of Oxygen 0 L     6 Minute Oxygen Saturation % 98 %     6 Minute Liters of Oxygen 0 L     2 Minute Post Oxygen Saturation % 100 %     2 Minute Post Liters of Oxygen 0 L              Oxygen Initial Assessment:  Oxygen Initial Assessment - 02/26/22 1120       Home Oxygen   Home Oxygen Device None    Sleep Oxygen Prescription None    Home Exercise Oxygen Prescription None    Home Resting Oxygen Prescription None      Initial 6 min Walk   Oxygen Used None      Program Oxygen Prescription   Program Oxygen Prescription None      Intervention   Short Term Goals To learn and understand importance of maintaining oxygen saturations>88%;To learn and demonstrate proper use of respiratory medications;To learn and understand importance of monitoring SPO2 with pulse oximeter and demonstrate accurate use of the pulse oximeter.;To learn and demonstrate proper pursed lip breathing techniques or other breathing techniques.     Long  Term Goals Verbalizes importance of monitoring SPO2 with pulse oximeter and return demonstration;Maintenance of O2 saturations>88%;Exhibits proper breathing techniques, such as pursed lip breathing or other method taught during program session;Compliance with respiratory medication;Demonstrates proper use of MDI's             Oxygen  Re-Evaluation:  Oxygen Re-Evaluation     Row Name 03/01/22 1446             Program Oxygen Prescription   Program Oxygen Prescription None         Home Oxygen   Home Oxygen Device None       Sleep Oxygen Prescription None       Home Exercise Oxygen Prescription None       Home Resting Oxygen Prescription None         Goals/Expected Outcomes   Short Term Goals To learn and understand importance of maintaining oxygen saturations>88%;To learn and demonstrate proper use of respiratory medications;To learn and understand importance of monitoring SPO2 with pulse oximeter and demonstrate accurate use of the pulse oximeter.;To learn and demonstrate proper pursed lip breathing techniques or other breathing techniques.        Long  Term Goals Verbalizes importance  of monitoring SPO2 with pulse oximeter and return demonstration;Maintenance of O2 saturations>88%;Exhibits proper breathing techniques, such as pursed lip breathing or other method taught during program session;Compliance with respiratory medication;Demonstrates proper use of MDI's       Comments Will monitor as pt exercises.       Goals/Expected Outcomes Compliance and understanding of oxygen saturation monitoring and breathing techniques to decrease shortness of breath. Compliance and understanding of oxygen saturation monitoring and breathing techniques to decrease shortness of breath.                Oxygen Discharge (Final Oxygen Re-Evaluation):  Oxygen Re-Evaluation - 03/01/22 1446       Program Oxygen Prescription   Program Oxygen Prescription None      Home Oxygen   Home Oxygen Device None    Sleep Oxygen Prescription None    Home Exercise Oxygen Prescription None    Home Resting Oxygen Prescription None      Goals/Expected Outcomes   Short Term Goals To learn and understand importance of maintaining oxygen saturations>88%;To learn and demonstrate proper use of respiratory medications;To learn and understand  importance of monitoring SPO2 with pulse oximeter and demonstrate accurate use of the pulse oximeter.;To learn and demonstrate proper pursed lip breathing techniques or other breathing techniques.     Long  Term Goals Verbalizes importance of monitoring SPO2 with pulse oximeter and return demonstration;Maintenance of O2 saturations>88%;Exhibits proper breathing techniques, such as pursed lip breathing or other method taught during program session;Compliance with respiratory medication;Demonstrates proper use of MDI's    Comments Will monitor as pt exercises.    Goals/Expected Outcomes Compliance and understanding of oxygen saturation monitoring and breathing techniques to decrease shortness of breath. Compliance and understanding of oxygen saturation monitoring and breathing techniques to decrease shortness of breath.             Initial Exercise Prescription:  Initial Exercise Prescription - 02/26/22 1400       Date of Initial Exercise RX and Referring Provider   Date 02/26/22    Referring Provider Loanne Drilling    Expected Discharge Date 05/06/22      Recumbant Bike   Level 1    Watts 20    Minutes 15      NuStep   Level 1    SPM 60    Minutes 15      Prescription Details   Frequency (times per week) 2    Duration Progress to 30 minutes of continuous aerobic without signs/symptoms of physical distress      Intensity   THRR 40-80% of Max Heartrate 60-119    Ratings of Perceived Exertion 11-13    Perceived Dyspnea 0-4      Progression   Progression Continue to progress workloads to maintain intensity without signs/symptoms of physical distress.      Resistance Training   Training Prescription Yes    Weight red bands    Reps 10-15             Perform Capillary Blood Glucose checks as needed.  Exercise Prescription Changes:   Exercise Comments:   Exercise Goals and Review:   Exercise Goals     Row Name 02/26/22 1454 03/01/22 1444           Exercise Goals    Increase Physical Activity Yes Yes      Intervention Provide advice, education, support and counseling about physical activity/exercise needs.;Develop an individualized exercise prescription for aerobic and resistive training based on initial  evaluation findings, risk stratification, comorbidities and participant's personal goals. Provide advice, education, support and counseling about physical activity/exercise needs.;Develop an individualized exercise prescription for aerobic and resistive training based on initial evaluation findings, risk stratification, comorbidities and participant's personal goals.      Expected Outcomes Short Term: Attend rehab on a regular basis to increase amount of physical activity.;Long Term: Exercising regularly at least 3-5 days a week.;Long Term: Add in home exercise to make exercise part of routine and to increase amount of physical activity. Short Term: Attend rehab on a regular basis to increase amount of physical activity.;Long Term: Exercising regularly at least 3-5 days a week.;Long Term: Add in home exercise to make exercise part of routine and to increase amount of physical activity.      Increase Strength and Stamina Yes Yes      Intervention Provide advice, education, support and counseling about physical activity/exercise needs.;Develop an individualized exercise prescription for aerobic and resistive training based on initial evaluation findings, risk stratification, comorbidities and participant's personal goals. Provide advice, education, support and counseling about physical activity/exercise needs.;Develop an individualized exercise prescription for aerobic and resistive training based on initial evaluation findings, risk stratification, comorbidities and participant's personal goals.      Expected Outcomes Short Term: Increase workloads from initial exercise prescription for resistance, speed, and METs.;Short Term: Perform resistance training exercises  routinely during rehab and add in resistance training at home;Long Term: Improve cardiorespiratory fitness, muscular endurance and strength as measured by increased METs and functional capacity (6MWT) Short Term: Increase workloads from initial exercise prescription for resistance, speed, and METs.;Short Term: Perform resistance training exercises routinely during rehab and add in resistance training at home;Long Term: Improve cardiorespiratory fitness, muscular endurance and strength as measured by increased METs and functional capacity (6MWT)      Able to understand and use rate of perceived exertion (RPE) scale Yes Yes      Intervention Provide education and explanation on how to use RPE scale Provide education and explanation on how to use RPE scale      Expected Outcomes Short Term: Able to use RPE daily in rehab to express subjective intensity level;Long Term:  Able to use RPE to guide intensity level when exercising independently Short Term: Able to use RPE daily in rehab to express subjective intensity level;Long Term:  Able to use RPE to guide intensity level when exercising independently      Able to understand and use Dyspnea scale Yes Yes      Intervention Provide education and explanation on how to use Dyspnea scale Provide education and explanation on how to use Dyspnea scale      Expected Outcomes Short Term: Able to use Dyspnea scale daily in rehab to express subjective sense of shortness of breath during exertion;Long Term: Able to use Dyspnea scale to guide intensity level when exercising independently Short Term: Able to use Dyspnea scale daily in rehab to express subjective sense of shortness of breath during exertion;Long Term: Able to use Dyspnea scale to guide intensity level when exercising independently      Knowledge and understanding of Target Heart Rate Range (THRR) Yes Yes      Intervention Provide education and explanation of THRR including how the numbers were predicted and  where they are located for reference Provide education and explanation of THRR including how the numbers were predicted and where they are located for reference      Expected Outcomes Short Term: Able to state/look up THRR;Long Term:  Able to use THRR to govern intensity when exercising independently;Short Term: Able to use daily as guideline for intensity in rehab Short Term: Able to state/look up THRR;Long Term: Able to use THRR to govern intensity when exercising independently;Short Term: Able to use daily as guideline for intensity in rehab      Understanding of Exercise Prescription Yes Yes      Intervention Provide education, explanation, and written materials on patient's individual exercise prescription Provide education, explanation, and written materials on patient's individual exercise prescription      Expected Outcomes Short Term: Able to explain program exercise prescription;Long Term: Able to explain home exercise prescription to exercise independently Short Term: Able to explain program exercise prescription;Long Term: Able to explain home exercise prescription to exercise independently               Exercise Goals Re-Evaluation :  Exercise Goals Re-Evaluation     [place] Name 03/01/22 1444             Exercise Goal Re-Evaluation   Exercise Goals Review Increase Physical Activity;Able to understand and use Dyspnea scale;Understanding of Exercise Prescription;Increase Strength and Stamina;Knowledge and understanding of Target Heart Rate Range (THRR);Able to understand and use rate of perceived exertion (RPE) scale       Comments Pt is scheduled to begin exercise 12/21. Will monitor for progression.       Expected Outcomes Through exercise at rehab and home, the patient will decrease shortness of breath with daily activities and feel confident in carrying out an exercise regimen at home.                Discharge Exercise Prescription (Final Exercise Prescription  Changes):   Nutrition:  Target Goals: Understanding of nutrition guidelines, daily intake of sodium <1552m, cholesterol <2040m calories 30% from fat and 7% or less from saturated fats, daily to have 5 or more servings of fruits and vegetables.  Biometrics:  Pre Biometrics - 02/26/22 1039       Pre Biometrics   Height 5' 2" (1.575 m)    BMI (Calculated) 24.79              Nutrition Therapy Plan and Nutrition Goals:   Nutrition Assessments:  MEDIFICTS Score Key: ?70 Need to make dietary changes  40-70 Heart Healthy Diet ? 40 Therapeutic Level Cholesterol Diet   Picture Your Plate Scores: <4<93nhealthy dietary pattern with much room for improvement. 41-50 Dietary pattern unlikely to meet recommendations for good health and room for improvement. 51-60 More healthful dietary pattern, with some room for improvement.  >60 Healthy dietary pattern, although there may be some specific behaviors that could be improved.    Nutrition Goals Re-Evaluation:   Nutrition Goals Discharge (Final Nutrition Goals Re-Evaluation):   Psychosocial: Target Goals: Acknowledge presence or absence of significant depression and/or stress, maximize coping skills, provide positive support system. Participant is able to verbalize types and ability to use techniques and skills needed for reducing stress and depression.  Initial Review & Psychosocial Screening:  Initial Psych Review & Screening - 02/26/22 1111       Initial Review   Current issues with None Identified      Family Dynamics   Good Support System? Yes    Comments Fiance, Daughter      Barriers   Psychosocial barriers to participate in program There are no identifiable barriers or psychosocial needs.      Screening Interventions   Interventions Encouraged to exercise  Quality of Life Scores:  Scores of 19 and below usually indicate a poorer quality of life in these areas.  A difference of  2-3 points is  a clinically meaningful difference.  A difference of 2-3 points in the total score of the Quality of Life Index has been associated with significant improvement in overall quality of life, self-image, physical symptoms, and general health in studies assessing change in quality of life.  PHQ-9: Review Flowsheet       02/26/2022  Depression screen PHQ 2/9  Decreased Interest 0  Down, Depressed, Hopeless 0  PHQ - 2 Score 0  Altered sleeping 0  Tired, decreased energy 0  Change in appetite 0  Feeling bad or failure about yourself  0  Trouble concentrating 0  Moving slowly or fidgety/restless 0  Suicidal thoughts 0  PHQ-9 Score 0  Difficult doing work/chores Not difficult at all   Interpretation of Total Score  Total Score Depression Severity:  1-4 = Minimal depression, 5-9 = Mild depression, 10-14 = Moderate depression, 15-19 = Moderately severe depression, 20-27 = Severe depression   Psychosocial Evaluation and Intervention:  Psychosocial Evaluation - 02/26/22 1113       Psychosocial Evaluation & Interventions   Interventions Encouraged to exercise with the program and follow exercise prescription    Comments Pt denies psychosocial barriers.    Expected Outcomes For Iliani to participate in PR free of psychosocial barriers    Continue Psychosocial Services  No Follow up required             Psychosocial Re-Evaluation:  Psychosocial Re-Evaluation     [place] Name 03/02/22 0927 03/03/22 1155           Psychosocial Re-Evaluation   Current issues with None Identified None Identified      Comments Lysette came in for orientation last Friday, 12/15. At that time, she denied any psychosocial barriers. Adeana has attended her orientationto the PR program. She is scheduled to start exercise on 03/04/22. we will continue to monitor for any psychosocial barriers.      Expected Outcomes Makaia will attend PR without any psychosocial barriers or concerns. For her to participate in the  program without any psychosocial issuses or concerns.      Interventions Encouraged to attend Pulmonary Rehabilitation for the exercise Encouraged to attend Cardiac Rehabilitation for the exercise      Continue Psychosocial Services  No Follow up required No Follow up required               Psychosocial Discharge (Final Psychosocial Re-Evaluation):  Psychosocial Re-Evaluation - 03/03/22 1155       Psychosocial Re-Evaluation   Current issues with None Identified    Comments Mayumi has attended her orientationto the PR program. She is scheduled to start exercise on 03/04/22. we will continue to monitor for any psychosocial barriers.    Expected Outcomes For her to participate in the program without any psychosocial issuses or concerns.    Interventions Encouraged to attend Cardiac Rehabilitation for the exercise    Continue Psychosocial Services  No Follow up required             Education: Education Goals: Education classes will be provided on a weekly basis, covering required topics. Participant will state understanding/return demonstration of topics presented.  Learning Barriers/Preferences:  Learning Barriers/Preferences - 02/26/22 1117       Learning Barriers/Preferences   Learning Barriers Sight   reading glasses   Learning Preferences Skilled Demonstration;Pictoral;Video;Written Material  Education Topics: Introduction to Pulmonary Rehab Group instruction provided by PowerPoint, verbal discussion, and written material to support subject matter. Instructor reviews what Pulmonary Rehab is, the purpose of the program, and how patients are referred.     Know Your Numbers Group instruction that is supported by a PowerPoint presentation. Instructor discusses importance of knowing and understanding resting, exercise, and post-exercise oxygen saturation, heart rate, and blood pressure. Oxygen saturation, heart rate, blood pressure, rating of perceived  exertion, and dyspnea are reviewed along with a normal range for these values.    Exercise for the Pulmonary Patient Group instruction that is supported by a PowerPoint presentation. Instructor discusses benefits of exercise, core components of exercise, frequency, duration, and intensity of an exercise routine, importance of utilizing pulse oximetry during exercise, safety while exercising, and options of places to exercise outside of rehab.       MET Level  Group instruction provided by PowerPoint, verbal discussion, and written material to support subject matter. Instructor reviews what METs are and how to increase METs.    Pulmonary Medications Verbally interactive group education provided by instructor with focus on inhaled medications and proper administration.   Anatomy and Physiology of the Respiratory System Group instruction provided by PowerPoint, verbal discussion, and written material to support subject matter. Instructor reviews respiratory cycle and anatomical components of the respiratory system and their functions. Instructor also reviews differences in obstructive and restrictive respiratory diseases with examples of each.    Oxygen Safety Group instruction provided by PowerPoint, verbal discussion, and written material to support subject matter. There is an overview of "What is Oxygen" and "Why do we need it".  Instructor also reviews how to create a safe environment for oxygen use, the importance of using oxygen as prescribed, and the risks of noncompliance. There is a brief discussion on traveling with oxygen and resources the patient may utilize.   Oxygen Use Group instruction provided by PowerPoint, verbal discussion, and written material to discuss how supplemental oxygen is prescribed and different types of oxygen supply systems. Resources for more information are provided.    Breathing Techniques Group instruction that is supported by demonstration and  informational handouts. Instructor discusses the benefits of pursed lip and diaphragmatic breathing and detailed demonstration on how to perform both.     Risk Factor Reduction Group instruction that is supported by a PowerPoint presentation. Instructor discusses the definition of a risk factor, different risk factors for pulmonary disease, and how the heart and lungs work together.   MD Day A group question and answer session with a medical doctor that allows participants to ask questions that relate to their pulmonary disease state.   Nutrition for the Pulmonary Patient Group instruction provided by PowerPoint slides, verbal discussion, and written materials to support subject matter. The instructor gives an explanation and review of healthy diet recommendations, which includes a discussion on weight management, recommendations for fruit and vegetable consumption, as well as protein, fluid, caffeine, fiber, sodium, sugar, and alcohol. Tips for eating when patients are short of breath are discussed.    Other Education Group or individual verbal, written, or video instructions that support the educational goals of the pulmonary rehab program.    Knowledge Questionnaire Score:  Knowledge Questionnaire Score - 02/26/22 1456       Knowledge Questionnaire Score   Pre Score 16/18             Core Components/Risk Factors/Patient Goals at Admission:  Personal Goals and Risk Factors at Admission -  02/26/22 1117       Core Components/Risk Factors/Patient Goals on Admission   Improve shortness of breath with ADL's Yes    Intervention Provide education, individualized exercise plan and daily activity instruction to help decrease symptoms of SOB with activities of daily living.    Expected Outcomes Short Term: Improve cardiorespiratory fitness to achieve a reduction of symptoms when performing ADLs    Increase knowledge of respiratory medications and ability to use respiratory devices  properly  Yes    Intervention Provide education and demonstration as needed of appropriate use of medications, inhalers, and oxygen therapy.    Expected Outcomes Short Term: Achieves understanding of medications use. Understands that oxygen is a medication prescribed by physician. Demonstrates appropriate use of inhaler and oxygen therapy.;Long Term: Maintain appropriate use of medications, inhalers, and oxygen therapy.             Core Components/Risk Factors/Patient Goals Review:   Goals and Risk Factor Review     Row Name 03/02/22 0936             Core Components/Risk Factors/Patient Goals Review   Personal Goals Review Improve shortness of breath with ADL's;Develop more efficient breathing techniques such as purse lipped breathing and diaphragmatic breathing and practicing self-pacing with activity.;Increase knowledge of respiratory medications and ability to use respiratory devices properly.       Review Chesnee came in for pulmonary rehab orientation last Friday, 12/15. She is scheduled to attend her first class on Thursday, 12/21. We look forward to educating her on breathing techniques, increase her knowledge of when and how to use her inhaler, and improve her shortness of breath while exercising and improving her endurance and stamina in everyday activities.       Expected Outcomes See admission goals                Core Components/Risk Factors/Patient Goals at Discharge (Final Review):   Goals and Risk Factor Review - 03/02/22 0936       Core Components/Risk Factors/Patient Goals Review   Personal Goals Review Improve shortness of breath with ADL's;Develop more efficient breathing techniques such as purse lipped breathing and diaphragmatic breathing and practicing self-pacing with activity.;Increase knowledge of respiratory medications and ability to use respiratory devices properly.    Review Liliann came in for pulmonary rehab orientation last Friday, 12/15. She is  scheduled to attend her first class on Thursday, 12/21. We look forward to educating her on breathing techniques, increase her knowledge of when and how to use her inhaler, and improve her shortness of breath while exercising and improving her endurance and stamina in everyday activities.    Expected Outcomes See admission goals             ITP Comments: Samaiya has completed PR orientation and is scheduled to start exercise 03/04/22 at 1:15 pm.    Comments: Dr. Rodman Pickle is Medical Director for Pulmonary Rehab at [facility].

## 2022-03-04 ENCOUNTER — Encounter (HOSPITAL_COMMUNITY): Payer: Medicare PPO

## 2022-03-09 ENCOUNTER — Telehealth (HOSPITAL_COMMUNITY): Payer: Self-pay | Admitting: *Deleted

## 2022-03-09 ENCOUNTER — Encounter (HOSPITAL_COMMUNITY): Payer: Medicare PPO

## 2022-03-09 NOTE — Telephone Encounter (Signed)
Called Jessica Woodard to check on her given she missed PR last week due to illness. Pt sts she is feeling some better but still has a significant cough and does not feel she can participate in PR today. Will cancel appointment.  Ethelda Chick BS, ACSM-CEP 03/09/2022 9:54 AM

## 2022-03-11 ENCOUNTER — Encounter (HOSPITAL_COMMUNITY): Payer: Medicare PPO

## 2022-03-16 ENCOUNTER — Encounter (HOSPITAL_COMMUNITY)
Admission: RE | Admit: 2022-03-16 | Discharge: 2022-03-16 | Disposition: A | Discharge status: Home / Self Care | Payer: Medicare PPO | Source: Ambulatory Visit | Attending: Pulmonary Disease | Admitting: Pulmonary Disease

## 2022-03-16 DIAGNOSIS — H903 Sensorineural hearing loss, bilateral: Secondary | ICD-10-CM | POA: Diagnosis not present

## 2022-03-16 DIAGNOSIS — R0609 Other forms of dyspnea: Secondary | ICD-10-CM | POA: Diagnosis not present

## 2022-03-16 DIAGNOSIS — H9113 Presbycusis, bilateral: Secondary | ICD-10-CM | POA: Diagnosis not present

## 2022-03-16 DIAGNOSIS — U099 Post covid-19 condition, unspecified: Secondary | ICD-10-CM | POA: Diagnosis not present

## 2022-03-16 NOTE — Progress Notes (Signed)
Daily Session Note  Patient Details  Name: Jessica Woodard MRN: [id number] Date of Birth: May 16, 1950 Referring Provider:   April Manson Pulmonary Rehab Walk Test from 02/26/2022 in [facility] for [facility], Vascular, & [place]  Referring Provider Loanne Drilling       Encounter Date: 03/16/2022  Check In:  Session Check In - 03/16/22 1558       Check-In   Supervising physician immediately available to respond to emergencies [facility] - Physician supervision    Physician(s) Dr. Silas [place]    Location [facility]    Staff Present Janine Ores, RN, Quentin Ore, MS, ACSM-CEP, Exercise Physiologist;Lisa Ysidro Evert, RN;Randi Olen Cordial BS, ACSM-CEP, Exercise Physiologist    Virtual Visit No    Medication changes reported     No    Fall or balance concerns reported    No    Tobacco Cessation No Change    Warm-up and Cool-down Performed as group-led instruction    Resistance Training Performed Yes    VAD Patient? No    PAD/SET Patient? No      Pain Assessment   Currently in Pain? No/denies             Capillary Blood Glucose: No results found for this or any previous visit (from the past 24 hour(s)).   Exercise Prescription Changes - 03/16/22 1500       Response to Exercise   Blood Pressure (Admit) 118/58    Blood Pressure (Exercise) 116/56    Blood Pressure (Exit) 100/62    Heart Rate (Admit) 65 bpm    Heart Rate (Exercise) 72 bpm    Heart Rate (Exit) 63 bpm    Oxygen Saturation (Admit) 100 %    Oxygen Saturation (Exercise) 97 %    Oxygen Saturation (Exit) 99 %    Rating of Perceived Exertion (Exercise) 11    Perceived Dyspnea (Exercise) 2    Duration Continue with 30 min of aerobic exercise without signs/symptoms of physical distress.    Intensity THRR unchanged      Progression   Progression Continue to progress workloads to maintain intensity without signs/symptoms of physical distress.      Resistance Training   Training  Prescription Yes    Weight Red Bands    Reps 10-15    Time 10 Minutes      Recumbant Bike   Level 1    Watts 20    Minutes 15    METs 2.1      NuStep   Level 1    SPM 60    Minutes 15    METs 1.5             Social History   Tobacco Use  Smoking Status Never  Smokeless Tobacco Never    Goals Met:  Exercise tolerated well No report of concerns or symptoms today Strength training completed today  Goals Unmet:  Not Applicable  Comments: Service time is from 1306 to Murphy    Dr. Rodman Pickle is Medical Director for Pulmonary Rehab at [facility].

## 2022-03-17 ENCOUNTER — Telehealth: Payer: Self-pay | Admitting: Pulmonary Disease

## 2022-03-17 LAB — GLUCOSE, CAPILLARY
Glucose-Capillary: 113 mg/dL — ABNORMAL HIGH (ref 70–99)
Glucose-Capillary: 158 mg/dL — ABNORMAL HIGH (ref 70–99)

## 2022-03-17 NOTE — Telephone Encounter (Signed)
-----   Message from [place], MD sent at 03/17/2022 12:45 PM EST ----- Regarding: Recall for April with me for sarcoidosis Please place recall for April with me for sarcoidosis

## 2022-03-17 NOTE — Telephone Encounter (Signed)
Recall placed. Front will contact pt when schedule is released. Nothing further needed.

## 2022-03-18 ENCOUNTER — Telehealth (HOSPITAL_COMMUNITY): Payer: Self-pay | Admitting: *Deleted

## 2022-03-18 ENCOUNTER — Encounter (HOSPITAL_COMMUNITY): Payer: Medicare PPO

## 2022-03-18 NOTE — Telephone Encounter (Signed)
Called pt to check in since she missed PR. She reports she had a stomach bug yesterday but is starting to feel better.  Yves Dill BS, ACSM-CEP 03/18/2022 3:56 PM

## 2022-03-23 ENCOUNTER — Encounter (HOSPITAL_COMMUNITY)
Admission: RE | Admit: 2022-03-23 | Discharge: 2022-03-23 | Disposition: A | Discharge status: Home / Self Care | Payer: Medicare PPO | Source: Ambulatory Visit | Attending: Pulmonary Disease | Admitting: Pulmonary Disease

## 2022-03-23 ENCOUNTER — Telehealth (HOSPITAL_COMMUNITY): Payer: Self-pay

## 2022-03-23 DIAGNOSIS — U099 Post covid-19 condition, unspecified: Secondary | ICD-10-CM

## 2022-03-23 DIAGNOSIS — R0609 Other forms of dyspnea: Secondary | ICD-10-CM | POA: Diagnosis not present

## 2022-03-23 LAB — GLUCOSE, CAPILLARY
Glucose-Capillary: 119 mg/dL — ABNORMAL HIGH (ref 70–99)
Glucose-Capillary: 117 mg/dL — ABNORMAL HIGH (ref 70–99)

## 2022-03-23 NOTE — Progress Notes (Signed)
Daily Session Note  Patient Details  Name: Jessica Woodard MRN: [id number] Date of Birth: [birth year] Referring Provider:   April Manson Pulmonary Rehab Walk Test from 02/26/2022 in [facility] for Heart, Vascular, & [place]  Referring Provider Loanne Drilling       Encounter Date: 03/23/2022  Check In:  Session Check In - 03/23/22 1036       Check-In   Supervising physician immediately available to respond to emergencies [facility] - Physician supervision    Physician(s) Dr. Marshell Garfinkel    Location [facility]    Staff Present Janine Ores, RN, Quentin Ore, MS, ACSM-CEP, Exercise Physiologist;Randi Yevonne Pax, ACSM-CEP, Exercise Physiologist    Virtual Visit No    Medication changes reported     No    Fall or balance concerns reported    No    Tobacco Cessation No Change    Warm-up and Cool-down Performed as group-led instruction    Resistance Training Performed Yes    VAD Patient? No    PAD/SET Patient? No      Pain Assessment   Currently in Pain? No/denies    Multiple Pain Sites No             Capillary Blood Glucose: Results for orders placed or performed during the hospital encounter of 03/16/22 (from the past 24 hour(s))  Glucose, capillary     Status: Abnormal   Collection Time: 03/23/22 10:07 AM  Result Value Ref Range   Glucose-Capillary 119 (H) 70 - 99 mg/dL      Social History   Tobacco Use  Smoking Status Never  Smokeless Tobacco Never    Goals Met:  Proper associated with RPD/PD & O2 Sat Exercise tolerated well No report of concerns or symptoms today Strength training completed today  Goals Unmet:  Not Applicable  Comments: Service time is from 1008 to 1135.    Dr. Rodman Pickle is Medical Director for Pulmonary Rehab at [facility].

## 2022-03-25 ENCOUNTER — Encounter (HOSPITAL_COMMUNITY)
Admission: RE | Admit: 2022-03-25 | Discharge: 2022-03-25 | Disposition: A | Discharge status: Home / Self Care | Payer: Medicare PPO | Source: Ambulatory Visit | Attending: Pulmonary Disease | Admitting: Pulmonary Disease

## 2022-03-25 DIAGNOSIS — R0609 Other forms of dyspnea: Secondary | ICD-10-CM | POA: Diagnosis not present

## 2022-03-25 DIAGNOSIS — U099 Post covid-19 condition, unspecified: Secondary | ICD-10-CM

## 2022-03-25 NOTE — Progress Notes (Signed)
Daily Session Note  Patient Details  Name: Jessica Woodard MRN: [id number] Date of Birth: [birth year] Referring Provider:   April Manson Pulmonary Rehab Walk Test from 02/26/2022 in [facility] for Heart, Vascular, & [place]  Referring Provider Loanne Drilling       Encounter Date: 03/25/2022  Check In:  Session Check In - 03/25/22 1429       Check-In   Supervising physician immediately available to respond to emergencies [facility] MD immediately available    Physician(s) Mannam    Location [facility]    Staff Present Janine Ores, RN, Quentin Ore, MS, ACSM-CEP, Exercise Physiologist;Randi Olen Cordial BS, ACSM-CEP, Exercise Physiologist;Jetta Walker BS, ACSM-CEP, Exercise Physiologist    Virtual Visit No    Medication changes reported     No    Fall or balance concerns reported    No    Tobacco Cessation No Change    Warm-up and Cool-down Performed as group-led instruction    Resistance Training Performed Yes    VAD Patient? No    PAD/SET Patient? No      Pain Assessment   Currently in Pain? No/denies    Multiple Pain Sites No             Capillary Blood Glucose: No results found for this or any previous visit (from the past 24 hour(s)).    Social History   Tobacco Use  Smoking Status Never  Smokeless Tobacco Never    Goals Met:  Independence with exercise equipment Exercise tolerated well No report of concerns or symptoms today Strength training completed today  Goals Unmet:  Not Applicable  Comments: Service time is from 1312 to 1455    Dr. Rodman Pickle is Medical Director for Pulmonary Rehab at [facility].

## 2022-03-30 ENCOUNTER — Encounter (HOSPITAL_COMMUNITY)
Admission: RE | Admit: 2022-03-30 | Discharge: 2022-03-30 | Disposition: A | Discharge status: Home / Self Care | Payer: Medicare PPO | Source: Ambulatory Visit | Attending: Pulmonary Disease | Admitting: Pulmonary Disease

## 2022-03-30 VITALS — Wt 135.4 lb

## 2022-03-30 DIAGNOSIS — U099 Post covid-19 condition, unspecified: Secondary | ICD-10-CM

## 2022-03-30 DIAGNOSIS — R0609 Other forms of dyspnea: Secondary | ICD-10-CM

## 2022-03-30 NOTE — Progress Notes (Signed)
Daily Session Note  Patient Details  Name: Jessica Woodard MRN: [id number] Date of Birth: 1950-10-13 Referring Provider:   April Manson Pulmonary Rehab Walk Test from 02/26/2022 in [facility] for Heart, Vascular, & [facility]  Referring Provider Loanne Drilling       Encounter Date: 03/30/2022  Check In:  Session Check In - 03/30/22 1430       Check-In   Supervising physician immediately available to respond to emergencies [facility] MD immediately available    Physician(s) Eric Form, NP    Location [facility]    Staff Present Janine Ores, RN, Quentin Ore, MS, ACSM-CEP, Exercise Physiologist;Randi Yevonne Pax, ACSM-CEP, Exercise Physiologist;Other    Virtual Visit No    Medication changes reported     No    Fall or balance concerns reported    No    Tobacco Cessation No Change    Warm-up and Cool-down Performed as group-led instruction    Resistance Training Performed Yes    VAD Patient? No    PAD/SET Patient? No      Pain Assessment   Currently in Pain? No/denies    Multiple Pain Sites No             Capillary Blood Glucose: No results found for this or any previous visit (from the past 24 hour(s)).   Exercise Prescription Changes - 03/30/22 1500       Response to Exercise   Blood Pressure (Admit) 116/60    Blood Pressure (Exercise) 118/60    Blood Pressure (Exit) 92/58    Heart Rate (Admit) 71 bpm    Heart Rate (Exercise) 78 bpm    Heart Rate (Exit) 71 bpm    Oxygen Saturation (Admit) 98 %    Oxygen Saturation (Exercise) 98 %    Oxygen Saturation (Exit) 99 %    Rating of Perceived Exertion (Exercise) 11    Perceived Dyspnea (Exercise) 1    Duration Continue with 30 min of aerobic exercise without signs/symptoms of physical distress.    Intensity THRR unchanged      Progression   Progression Continue to progress workloads to maintain intensity without signs/symptoms of physical distress.      Resistance Training    Training Prescription Yes    Weight Red Bands    Reps 10-15    Time 10 Minutes      Recumbant Bike   Level 2    Minutes 15    METs 2.6      NuStep   Level 2    SPM 60    Minutes 15    METs 2             Social History   Tobacco Use  Smoking Status Never  Smokeless Tobacco Never    Goals Met:  Proper associated with RPD/PD & O2 Sat Exercise tolerated well No report of concerns or symptoms today Strength training completed today  Goals Unmet:  Not Applicable  Comments: Service time is from 1319 to 1445.    Dr. Rodman Pickle is Medical Director for Pulmonary Rehab at [facility].

## 2022-03-31 NOTE — Progress Notes (Signed)
Pulmonary Individual Treatment Plan  Patient Details  Name: Jessica Woodard MRN: [id number] Date of Birth: 1950/05/08 Referring Provider:   April Manson Pulmonary Rehab Walk Test from 02/26/2022 in [facility] for Heart, Vascular, & [place]  Referring Provider Loanne Drilling       Initial Encounter Date:  Flowsheet Row Pulmonary Rehab Walk Test from 02/26/2022 in [facility] for Heart, Vascular, & [place]  Date 02/26/22       Visit Diagnosis: DOE (dyspnea on exertion)  Post covid-19 condition, unspecified  Patient's Home Medications on Admission:   Current Outpatient Medications:    albuterol (VENTOLIN HFA) 108 (90 Base) MCG/ACT inhaler, Inhale 2 puffs into the lungs every 6 (six) hours as needed for wheezing or shortness of breath., Disp: 1 each, Rfl: 3   APPLE CIDER VINEGAR PO, See admin instructions., Disp: , Rfl:    Ascorbic Acid (VITAMIN C) 100 MG tablet, Take 100 mg by mouth daily., Disp: , Rfl:    aspirin 81 MG tablet, Take 81 mg by mouth daily., Disp: , Rfl:    Cholecalciferol (VITAMIN D3) 250 MCG (10000 UT) capsule, 2 capsules, Disp: , Rfl:    ciclesonide (ALVESCO) 80 MCG/ACT inhaler, Inhale 1 puff into the lungs 2 (two) times daily., Disp: , Rfl:    cyanocobalamin (VITAMIN B12) 1000 MCG tablet, Take 1,000 mcg by mouth daily., Disp: , Rfl:    lansoprazole (PREVACID) 30 MG capsule, Take 30 mg by mouth 2 (two) times daily before a meal., Disp: , Rfl:    Melatonin 5 MG/15ML LIQD, Take 5 mg by mouth at bedtime., Disp: , Rfl:    metFORMIN (GLUCOPHAGE-XR) 500 MG 24 hr tablet, , Disp: , Rfl:    metoprolol (TOPROL-XL) 50 MG 24 hr tablet, Take 75 mg by mouth daily., Disp: , Rfl:    Misc Natural Products (ELDERBERRY IMMUNE COMPLEX PO), Take 50 mg by mouth daily., Disp: , Rfl:    montelukast (SINGULAIR) 10 MG tablet, Take 10 mg by mouth at bedtime., Disp: , Rfl:    Omega-3 Fatty Acids (FISH OIL) 1200 MG CAPS, Take 1 capsule by mouth  daily., Disp: , Rfl:    OneTouch Delica Lancets 22G MISC, , Disp: , Rfl:    Propylene Glycol 0.6 % SOLN, Apply to eye., Disp: , Rfl:    rosuvastatin (CRESTOR) 5 MG tablet, , Disp: , Rfl:    spironolactone (ALDACTONE) 25 MG tablet, , Disp: , Rfl:    telmisartan (MICARDIS) 40 MG tablet, TAKE 1 TABLET BY MOUTH EVERY DAY FOR BLOOD PRESSURE, Disp: , Rfl:    valACYclovir (VALTREX) 500 MG tablet, Take 1 tablet by mouth Daily., Disp: , Rfl:   Past Medical History: Past Medical History:  Diagnosis Date   Allergic rhinitis, cause unspecified    Allergy    seasonal    Anxiety    sleep walking/night terrors   Asthma    inhalers   BRONCHITIS, ACUTE 04/29/2009   Qualifier: Diagnosis of  By: Annamaria Boots MD, Clinton D    GERD (gastroesophageal reflux disease)    Hypertension    Sarcoid     Tobacco Use: Social History   Tobacco Use  Smoking Status Never  Smokeless Tobacco Never    Labs: Review Flowsheet        No data to display          Capillary Blood Glucose: Lab Results  Component Value Date   GLUCAP 117 (H) 03/23/2022   GLUCAP 119 (  H) 03/23/2022   GLUCAP 158 (H) 03/16/2022   GLUCAP 113 (H) 03/16/2022   GLUCAP 99 02/26/2022     Pulmonary Assessment Scores:  Pulmonary Assessment Scores     Row Name 02/26/22 1122         ADL UCSD   ADL Phase Entry     SOB Score total 65       CAT Score   CAT Score 24       mMRC Score   mMRC Score 2             UCSD: Self-administered rating of dyspnea associated with activities of daily living (ADLs) 6-point scale (0 = "not at all" to 5 = "maximal or unable to do because of breathlessness")  Scoring Scores range from 0 to 120.  Minimally important difference is 5 units  CAT: CAT can identify the health impairment of COPD patients and is better correlated with disease progression.  CAT has a scoring range of zero to 40. The CAT score is classified into four groups of low (less than 10), medium (10 - 20), high (21-30) and  very high (31-40) based on the impact level of disease on health status. A CAT score over 10 suggests significant symptoms.  A worsening CAT score could be explained by an exacerbation, poor medication adherence, poor inhaler technique, or progression of COPD or comorbid conditions.  CAT MCID is 2 points  mMRC: mMRC (Modified Medical Research Council) Dyspnea Scale is used to assess the degree of baseline functional disability in patients of respiratory disease due to dyspnea. No minimal important difference is established. A decrease in score of 1 point or greater is considered a positive change.   Pulmonary Function Assessment:  Pulmonary Function Assessment - 02/26/22 1455       Breath   Bilateral Breath Sounds Clear    Shortness of Breath Yes;Limiting activity             Exercise Target Goals: Exercise Program Goal: Individual exercise prescription set using results from initial 6 min walk test and THRR while considering  patient's activity barriers and safety.   Exercise Prescription Goal: Initial exercise prescription builds to 30-45 minutes a day of aerobic activity, 2-3 days per week.  Home exercise guidelines will be given to patient during program as part of exercise prescription that the participant will acknowledge.  Activity Barriers & Risk Stratification:  Activity Barriers & Cardiac Risk Stratification - 02/26/22 1123       Activity Barriers & Cardiac Risk Stratification   Activity Barriers Deconditioning;Muscular Weakness;Shortness of Breath;Balance Concerns;History of Falls             6 Minute Walk:  6 Minute Walk     Row Name 02/26/22 1450         6 Minute Walk   Phase Initial     Distance 960 feet     Walk Time 6 minutes     # of Rest Breaks 0     MPH 1.82     METS 1.93     RPE 13     Perceived Dyspnea  2     VO2 Peak 6.75     Symptoms No     Resting HR 61 bpm     Resting BP 94/59     Resting Oxygen Saturation  98 %     Exercise  Oxygen Saturation  during 6 min walk 94 %     Max Ex. HR 75 bpm  Max Ex. BP 110/62     2 Minute Post BP 107/64       Interval HR   1 Minute HR 57     2 Minute HR 70     3 Minute HR 74     4 Minute HR 75     5 Minute HR 75     6 Minute HR 74     2 Minute Post HR 56     Interval Heart Rate? Yes       Interval Oxygen   Interval Oxygen? Yes     Baseline Oxygen Saturation % 98 %     1 Minute Oxygen Saturation % 100 %     1 Minute Liters of Oxygen 0 L     2 Minute Oxygen Saturation % 100 %     2 Minute Liters of Oxygen 0 L     3 Minute Oxygen Saturation % 97 %     3 Minute Liters of Oxygen 0 L     4 Minute Oxygen Saturation % 95 %     4 Minute Liters of Oxygen 0 L     5 Minute Oxygen Saturation % 94 %     5 Minute Liters of Oxygen 0 L     6 Minute Oxygen Saturation % 98 %     6 Minute Liters of Oxygen 0 L     2 Minute Post Oxygen Saturation % 100 %     2 Minute Post Liters of Oxygen 0 L              Oxygen Initial Assessment:  Oxygen Initial Assessment - 02/26/22 1120       Home Oxygen   Home Oxygen Device None    Sleep Oxygen Prescription None    Home Exercise Oxygen Prescription None    Home Resting Oxygen Prescription None      Initial 6 min Walk   Oxygen Used None      Program Oxygen Prescription   Program Oxygen Prescription None      Intervention   Short Term Goals To learn and understand importance of maintaining oxygen saturations>88%;To learn and demonstrate proper use of respiratory medications;To learn and understand importance of monitoring SPO2 with pulse oximeter and demonstrate accurate use of the pulse oximeter.;To learn and demonstrate proper pursed lip breathing techniques or other breathing techniques.     Long  Term Goals Verbalizes importance of monitoring SPO2 with pulse oximeter and return demonstration;Maintenance of O2 saturations>88%;Exhibits proper breathing techniques, such as pursed lip breathing or other method taught during program  session;Compliance with respiratory medication;Demonstrates proper use of MDI's             Oxygen Re-Evaluation:  Oxygen Re-Evaluation     Row Name 03/01/22 1446 03/30/22 0838           Program Oxygen Prescription   Program Oxygen Prescription None None        Home Oxygen   Home Oxygen Device None None      Sleep Oxygen Prescription None None      Home Exercise Oxygen Prescription None None      Home Resting Oxygen Prescription None None        Goals/Expected Outcomes   Short Term Goals To learn and understand importance of maintaining oxygen saturations>88%;To learn and demonstrate proper use of respiratory medications;To learn and understand importance of monitoring SPO2 with pulse oximeter and demonstrate accurate use of the pulse oximeter.;To learn and  demonstrate proper pursed lip breathing techniques or other breathing techniques.  To learn and understand importance of maintaining oxygen saturations>88%;To learn and demonstrate proper use of respiratory medications;To learn and understand importance of monitoring SPO2 with pulse oximeter and demonstrate accurate use of the pulse oximeter.;To learn and demonstrate proper pursed lip breathing techniques or other breathing techniques.       Long  Term Goals Verbalizes importance of monitoring SPO2 with pulse oximeter and return demonstration;Maintenance of O2 saturations>88%;Exhibits proper breathing techniques, such as pursed lip breathing or other method taught during program session;Compliance with respiratory medication;Demonstrates proper use of MDI's Verbalizes importance of monitoring SPO2 with pulse oximeter and return demonstration;Maintenance of O2 saturations>88%;Exhibits proper breathing techniques, such as pursed lip breathing or other method taught during program session;Compliance with respiratory medication;Demonstrates proper use of MDI's      Comments Will monitor as pt exercises. no change      Goals/Expected  Outcomes Compliance and understanding of oxygen saturation monitoring and breathing techniques to decrease shortness of breath. Compliance and understanding of oxygen saturation monitoring and breathing techniques to decrease shortness of breath. Compliance and understanding of oxygen saturation monitoring and breathing techniques to decrease shortness of breath.               Oxygen Discharge (Final Oxygen Re-Evaluation):  Oxygen Re-Evaluation - 03/30/22 0838       Program Oxygen Prescription   Program Oxygen Prescription None      Home Oxygen   Home Oxygen Device None    Sleep Oxygen Prescription None    Home Exercise Oxygen Prescription None    Home Resting Oxygen Prescription None      Goals/Expected Outcomes   Short Term Goals To learn and understand importance of maintaining oxygen saturations>88%;To learn and demonstrate proper use of respiratory medications;To learn and understand importance of monitoring SPO2 with pulse oximeter and demonstrate accurate use of the pulse oximeter.;To learn and demonstrate proper pursed lip breathing techniques or other breathing techniques.     Long  Term Goals Verbalizes importance of monitoring SPO2 with pulse oximeter and return demonstration;Maintenance of O2 saturations>88%;Exhibits proper breathing techniques, such as pursed lip breathing or other method taught during program session;Compliance with respiratory medication;Demonstrates proper use of MDI's    Comments no change    Goals/Expected Outcomes Compliance and understanding of oxygen saturation monitoring and breathing techniques to decrease shortness of breath.             Initial Exercise Prescription:  Initial Exercise Prescription - 02/26/22 1400       Date of Initial Exercise RX and Referring Provider   Date 02/26/22    Referring Provider Everardo All    Expected Discharge Date 05/06/22      Recumbant Bike   Level 1    Watts 20    Minutes 15      NuStep   Level 1     SPM 60    Minutes 15      Prescription Details   Frequency (times per week) 2    Duration Progress to 30 minutes of continuous aerobic without signs/symptoms of physical distress      Intensity   THRR 40-80% of Max Heartrate 60-119    Ratings of Perceived Exertion 11-13    Perceived Dyspnea 0-4      Progression   Progression Continue to progress workloads to maintain intensity without signs/symptoms of physical distress.      Resistance Training   Training Prescription Yes  Weight red bands    Reps 10-15             Perform Capillary Blood Glucose checks as needed.  Exercise Prescription Changes:   Exercise Prescription Changes     Row Name 03/16/22 1500 03/30/22 1500           Response to Exercise   Blood Pressure (Admit) 118/58 116/60      Blood Pressure (Exercise) 116/56 118/60      Blood Pressure (Exit) 100/62 92/58      Heart Rate (Admit) 65 bpm 71 bpm      Heart Rate (Exercise) 72 bpm 78 bpm      Heart Rate (Exit) 63 bpm 71 bpm      Oxygen Saturation (Admit) 100 % 98 %      Oxygen Saturation (Exercise) 97 % 98 %      Oxygen Saturation (Exit) 99 % 99 %      Rating of Perceived Exertion (Exercise) 11 11      Perceived Dyspnea (Exercise) 2 1      Duration Continue with 30 min of aerobic exercise without signs/symptoms of physical distress. Continue with 30 min of aerobic exercise without signs/symptoms of physical distress.      Intensity THRR unchanged THRR unchanged        Progression   Progression Continue to progress workloads to maintain intensity without signs/symptoms of physical distress. Continue to progress workloads to maintain intensity without signs/symptoms of physical distress.        Resistance Training   Training Prescription Yes Yes      Weight Red Bands Red Bands      Reps 10-15 10-15      Time 10 Minutes 10 Minutes        Recumbant Bike   Level 1 2      Watts 20 --      Minutes 15 15      METs 2.1 2.6        NuStep    Level 1 2      SPM 60 60      Minutes 15 15      METs 1.5 2               Exercise Comments:   Exercise Comments     Row Name 03/16/22 1616           Exercise Comments Pt completed first day of exercise. She exercised for 15 min on the recumbent bike and Nustep. Claude averaged 2.1 METs at level 1 on the recumbent bike and 1.5 METs at level 1 on the Nustep. She performed the warmup and cooldown standing without limitations. Discussed METs and how to increase METs.                Exercise Goals and Review:   Exercise Goals     Row Name 02/26/22 1454 03/01/22 1444 03/30/22 0818         Exercise Goals   Increase Physical Activity Yes Yes Yes     Intervention Provide advice, education, support and counseling about physical activity/exercise needs.;Develop an individualized exercise prescription for aerobic and resistive training based on initial evaluation findings, risk stratification, comorbidities and participant's personal goals. Provide advice, education, support and counseling about physical activity/exercise needs.;Develop an individualized exercise prescription for aerobic and resistive training based on initial evaluation findings, risk stratification, comorbidities and participant's personal goals. Provide advice, education, support and counseling about physical activity/exercise needs.;Develop an individualized exercise  prescription for aerobic and resistive training based on initial evaluation findings, risk stratification, comorbidities and participant's personal goals.     Expected Outcomes Short Term: Attend rehab on a regular basis to increase amount of physical activity.;Long Term: Exercising regularly at least 3-5 days a week.;Long Term: Add in home exercise to make exercise part of routine and to increase amount of physical activity. Short Term: Attend rehab on a regular basis to increase amount of physical activity.;Long Term: Exercising regularly at least 3-5  days a week.;Long Term: Add in home exercise to make exercise part of routine and to increase amount of physical activity. Short Term: Attend rehab on a regular basis to increase amount of physical activity.;Long Term: Exercising regularly at least 3-5 days a week.;Long Term: Add in home exercise to make exercise part of routine and to increase amount of physical activity.     Increase Strength and Stamina Yes Yes Yes     Intervention Provide advice, education, support and counseling about physical activity/exercise needs.;Develop an individualized exercise prescription for aerobic and resistive training based on initial evaluation findings, risk stratification, comorbidities and participant's personal goals. Provide advice, education, support and counseling about physical activity/exercise needs.;Develop an individualized exercise prescription for aerobic and resistive training based on initial evaluation findings, risk stratification, comorbidities and participant's personal goals. Provide advice, education, support and counseling about physical activity/exercise needs.;Develop an individualized exercise prescription for aerobic and resistive training based on initial evaluation findings, risk stratification, comorbidities and participant's personal goals.     Expected Outcomes Short Term: Increase workloads from initial exercise prescription for resistance, speed, and METs.;Short Term: Perform resistance training exercises routinely during rehab and add in resistance training at home;Long Term: Improve cardiorespiratory fitness, muscular endurance and strength as measured by increased METs and functional capacity ( ) Short Term: Increase workloads from initial exercise prescription for resistance, speed, and METs.;Short Term: Perform resistance training exercises routinely during rehab and add in resistance training at home;Long Term: Improve cardiorespiratory fitness, muscular endurance and strength as  measured by increased METs and functional capacity ( ) Short Term: Increase workloads from initial exercise prescription for resistance, speed, and METs.;Short Term: Perform resistance training exercises routinely during rehab and add in resistance training at home;Long Term: Improve cardiorespiratory fitness, muscular endurance and strength as measured by increased METs and functional capacity ( )     Able to understand and use rate of perceived exertion (RPE) scale Yes Yes Yes     Intervention Provide education and explanation on how to use RPE scale Provide education and explanation on how to use RPE scale Provide education and explanation on how to use RPE scale     Expected Outcomes Short Term: Able to use RPE daily in rehab to express subjective intensity level;Long Term:  Able to use RPE to guide intensity level when exercising independently Short Term: Able to use RPE daily in rehab to express subjective intensity level;Long Term:  Able to use RPE to guide intensity level when exercising independently Short Term: Able to use RPE daily in rehab to express subjective intensity level;Long Term:  Able to use RPE to guide intensity level when exercising independently     Able to understand and use Dyspnea scale Yes Yes Yes     Intervention Provide education and explanation on how to use Dyspnea scale Provide education and explanation on how to use Dyspnea scale Provide education and explanation on how to use Dyspnea scale     Expected Outcomes Short Term: Able to use Dyspnea  scale daily in rehab to express subjective sense of shortness of breath during exertion;Long Term: Able to use Dyspnea scale to guide intensity level when exercising independently Short Term: Able to use Dyspnea scale daily in rehab to express subjective sense of shortness of breath during exertion;Long Term: Able to use Dyspnea scale to guide intensity level when exercising independently Short Term: Able to use Dyspnea scale  daily in rehab to express subjective sense of shortness of breath during exertion;Long Term: Able to use Dyspnea scale to guide intensity level when exercising independently     Knowledge and understanding of Target Heart Rate Range (THRR) Yes Yes Yes     Intervention Provide education and explanation of THRR including how the numbers were predicted and where they are located for reference Provide education and explanation of THRR including how the numbers were predicted and where they are located for reference Provide education and explanation of THRR including how the numbers were predicted and where they are located for reference     Expected Outcomes Short Term: Able to state/look up THRR;Long Term: Able to use THRR to govern intensity when exercising independently;Short Term: Able to use daily as guideline for intensity in rehab Short Term: Able to state/look up THRR;Long Term: Able to use THRR to govern intensity when exercising independently;Short Term: Able to use daily as guideline for intensity in rehab Short Term: Able to state/look up THRR;Long Term: Able to use THRR to govern intensity when exercising independently;Short Term: Able to use daily as guideline for intensity in rehab     Understanding of Exercise Prescription Yes Yes Yes     Intervention Provide education, explanation, and written materials on patient's individual exercise prescription Provide education, explanation, and written materials on patient's individual exercise prescription Provide education, explanation, and written materials on patient's individual exercise prescription     Expected Outcomes Short Term: Able to explain program exercise prescription;Long Term: Able to explain home exercise prescription to exercise independently Short Term: Able to explain program exercise prescription;Long Term: Able to explain home exercise prescription to exercise independently Short Term: Able to explain program exercise  prescription;Long Term: Able to explain home exercise prescription to exercise independently              Exercise Goals Re-Evaluation :  Exercise Goals Re-Evaluation     Row Name 03/01/22 1444 03/30/22 0819           Exercise Goal Re-Evaluation   Exercise Goals Review Increase Physical Activity;Able to understand and use Dyspnea scale;Understanding of Exercise Prescription;Increase Strength and Stamina;Knowledge and understanding of Target Heart Rate Range (THRR);Able to understand and use rate of perceived exertion (RPE) scale Increase Physical Activity;Able to understand and use Dyspnea scale;Understanding of Exercise Prescription;Increase Strength and Stamina;Knowledge and understanding of Target Heart Rate Range (THRR);Able to understand and use rate of perceived exertion (RPE) scale      Comments Pt is scheduled to begin exercise 12/21. Will monitor for progression. Pt has completed 3 exercise sessions. She has missed multiple sessions early in her program due to illness. She is exercising for 15 min on the recumbent bike and Nustep. Mariell is averaging 2.4 METs at level 2 on the recumbent bike and 1.9 METs at level 2 on the Nustep. She performed the warmup and cooldown standing without limitations. She is beginning to progress now.      Expected Outcomes Through exercise at rehab and home, the patient will decrease shortness of breath with daily activities and feel confident in  carrying out an exercise regimen at home. Through exercise at rehab and home, the patient will decrease shortness of breath with daily activities and feel confident in carrying out an exercise regimen at home.               Discharge Exercise Prescription (Final Exercise Prescription Changes):  Exercise Prescription Changes - 03/30/22 1500       Response to Exercise   Blood Pressure (Admit) 116/60    Blood Pressure (Exercise) 118/60    Blood Pressure (Exit) 92/58    Heart Rate (Admit) 71 bpm    Heart  Rate (Exercise) 78 bpm    Heart Rate (Exit) 71 bpm    Oxygen Saturation (Admit) 98 %    Oxygen Saturation (Exercise) 98 %    Oxygen Saturation (Exit) 99 %    Rating of Perceived Exertion (Exercise) 11    Perceived Dyspnea (Exercise) 1    Duration Continue with 30 min of aerobic exercise without signs/symptoms of physical distress.    Intensity THRR unchanged      Progression   Progression Continue to progress workloads to maintain intensity without signs/symptoms of physical distress.      Resistance Training   Training Prescription Yes    Weight Red Bands    Reps 10-15    Time 10 Minutes      Recumbant Bike   Level 2    Minutes 15    METs 2.6      NuStep   Level 2    SPM 60    Minutes 15    METs 2             Nutrition:  Target Goals: Understanding of nutrition guidelines, daily intake of sodium 1500mg , cholesterol 200mg , calories 30% from fat and 7% or less from saturated fats, daily to have 5 or more servings of fruits and vegetables.  Biometrics:  Pre Biometrics - 02/26/22 1039       Pre Biometrics   Height 5\' 2"  (1.575 m)    BMI (Calculated) 24.79              Nutrition Therapy Plan and Nutrition Goals:  Nutrition Therapy & Goals - 03/18/22 1547       Nutrition Therapy   Diet Carbohydrate Consistent Die t    Drug/Food Interactions Statins/Certain Fruits      Personal Nutrition Goals   Personal Goal #2 Patient to identify strategies for blood sugar control to aid with A1c <7%    Comments [name] reports A1c ~6.1. She reports variable appetite; weight appears stable over the last 12 months. She does eat 2 meals daily and 1 snack. She has previously seen an outside RD to aid with diabetes support.      Intervention Plan   Intervention Prescribe, educate and counsel regarding individualized specific dietary modifications aiming towards targeted core components such as weight, hypertension, lipid management, diabetes, heart failure and other  comorbidities.;Nutrition handout(s) given to patient.    Expected Outcomes Short Term Goal: Understand basic principles of dietary content, such as calories, fat, sodium, cholesterol and nutrients.;Long Term Goal: Adherence to prescribed nutrition plan.             Nutrition Assessments:  Nutrition Assessments - 03/18/22 1051       Rate Your Plate Scores   Pre Score 69            MEDIFICTS Score Key: ?70 Need to make dietary changes  40-70 Heart Healthy Diet ? 40  Therapeutic Level Cholesterol Diet  Flowsheet Row PULMONARY REHAB CHRONIC OBSTRUCTIVE PULMONARY DISEASE from [facility]16/2022 in [facility], [facility], & [facility]  Picture Your Plate Total Score on Admission 69      Picture Your Plate Scores: <38 Unhealthy dietary pattern with much room for improvement. 41-50 Dietary pattern unlikely to meet recommendations for good health and room for improvement. 51-60 More healthful dietary pattern, with some room for improvement.  >60 Healthy dietary pattern, although there may be some specific behaviors that could be improved.    Nutrition Goals Re-Evaluation:  Nutrition Goals Re-Evaluation     Row Name 03/18/22 1547             Goals   Current Weight 132 lb 11.5 oz (60.2 kg)  weight from 1/2 exercise session       Nutrition Goal Patient to improve diet quality by using the plate method as a guide for meal planning to include lean protein/plant protein, fruits, vegetables, whole grains, nonfat dairy as part of a balanced diet.       Expected Outcome Maram reports A1c ~6.1. She reports variable appetite; weight appears stable over the last 12 months. She does eat 2 meals daily and 1 snack. She has previously seen an outside RD to aid with diabetes support. Ahmyah will benefit from participation in pulmonary rehab for nutrition education, exercise, and lifestyle modification.                Nutrition Goals Discharge (Final Nutrition  Goals Re-Evaluation):  Nutrition Goals Re-Evaluation - 03/18/22 1547       Goals   Current Weight 132 lb 11.5 oz (60.2 kg)   weight from 1/2 exercise session   Nutrition Goal Patient to improve diet quality by using the plate method as a guide for meal planning to include lean protein/plant protein, fruits, vegetables, whole grains, nonfat dairy as part of a balanced diet.    Expected Outcome Jurnei reports A1c ~6.1. She reports variable appetite; weight appears stable over the last 12 months. She does eat 2 meals daily and 1 snack. She has previously seen an outside RD to aid with diabetes support. Eilish will benefit from participation in pulmonary rehab for nutrition education, exercise, and lifestyle modification.             Psychosocial: Target Goals: Acknowledge presence or absence of significant depression and/or stress, maximize coping skills, provide positive support system. Participant is able to verbalize types and ability to use techniques and skills needed for reducing stress and depression.  Initial Review & Psychosocial Screening:  Initial Psych Review & Screening - 02/26/22 1111       Initial Review   Current issues with None Identified      Family Dynamics   Good Support System? Yes    Comments Fiance, Daughter      Barriers   Psychosocial barriers to participate in program There are no identifiable barriers or psychosocial needs.      Screening Interventions   Interventions Encouraged to exercise             Quality of Life Scores:  Scores of 19 and below usually indicate a poorer quality of life in these areas.  A difference of  2-3 points is a clinically meaningful difference.  A difference of 2-3 points in the total score of the Quality of Life Index has been associated with significant improvement in overall quality of life, self-image, physical symptoms, and general  health in studies assessing change in quality of life.  PHQ-9: Review Flowsheet        02/26/2022  Depression screen PHQ 2/9  Decreased Interest 0  Down, Depressed, Hopeless 0  PHQ - 2 Score 0  Altered sleeping 0  Tired, decreased energy 0  Change in appetite 0  Feeling bad or failure about yourself  0  Trouble concentrating 0  Moving slowly or fidgety/restless 0  Suicidal thoughts 0  PHQ-9 Score 0  Difficult doing work/chores Not difficult at all   Interpretation of Total Score  Total Score Depression Severity:  1-4 = Minimal depression, 5-9 = Mild depression, 10-14 = Moderate depression, 15-19 = Moderately severe depression, 20-27 = Severe depression   Psychosocial Evaluation and Intervention:  Psychosocial Evaluation - 02/26/22 1113       Psychosocial Evaluation & Interventions   Interventions Encouraged to exercise with the program and follow exercise prescription    Comments Pt denies psychosocial barriers.    Expected Outcomes For Laylana to participate in PR free of psychosocial barriers    Continue Psychosocial Services  No Follow up required             Psychosocial Re-Evaluation:  Psychosocial Re-Evaluation     Row Name 03/02/22 1610 03/03/22 1155 03/30/22 0901         Psychosocial Re-Evaluation   Current issues with None Identified None Identified None Identified     Comments Tsuyako came in for orientation last Friday, 12/15. At that time, she denied any psychosocial barriers. Jeanetta has attended her orientationto the PR program. She is scheduled to start exercise on 03/04/22. we will continue to monitor for any psychosocial barriers. Arin has attended 3 PR classes. She reports no psychosocial barriers or concerns. She states she has good support from her daughter and her fianc. We will continue to monitor for any psychosocial barriers.     Expected Outcomes Keilly will attend PR without any psychosocial barriers or concerns. For her to participate in the program without any psychosocial issuses or concerns. For Miyani to participate in the  PR program without any psychosocial issuses or concerns.     Interventions Encouraged to attend Pulmonary Rehabilitation for the exercise Encouraged to attend Cardiac Rehabilitation for the exercise Encouraged to attend Cardiac Rehabilitation for the exercise     Continue Psychosocial Services  No Follow up required No Follow up required No Follow up required              Psychosocial Discharge (Final Psychosocial Re-Evaluation):  Psychosocial Re-Evaluation - 03/30/22 0901       Psychosocial Re-Evaluation   Current issues with None Identified    Comments Dianely has attended 3 PR classes. She reports no psychosocial barriers or concerns. She states she has good support from her daughter and her fianc. We will continue to monitor for any psychosocial barriers.    Expected Outcomes For Aissa to participate in the PR program without any psychosocial issuses or concerns.    Interventions Encouraged to attend Cardiac Rehabilitation for the exercise    Continue Psychosocial Services  No Follow up required             Education: Education Goals: Education classes will be provided on a weekly basis, covering required topics. Participant will state understanding/return demonstration of topics presented.  Learning Barriers/Preferences:  Learning Barriers/Preferences - 02/26/22 1117       Learning Barriers/Preferences   Learning Barriers Sight   reading glasses   Learning Preferences Skilled  Demonstration;Pictoral;Video;Written Material             Education Topics: Introduction to Pulmonary Rehab Group instruction provided by PowerPoint, verbal discussion, and written material to support subject matter. Instructor reviews what Pulmonary Rehab is, the purpose of the program, and how patients are referred.     Know Your Numbers Group instruction that is supported by a PowerPoint presentation. Instructor discusses importance of knowing and understanding resting, exercise, and  post-exercise oxygen saturation, heart rate, and blood pressure. Oxygen saturation, heart rate, blood pressure, rating of perceived exertion, and dyspnea are reviewed along with a normal range for these values.    Exercise for the Pulmonary Patient Group instruction that is supported by a PowerPoint presentation. Instructor discusses benefits of exercise, core components of exercise, frequency, duration, and intensity of an exercise routine, importance of utilizing pulse oximetry during exercise, safety while exercising, and options of places to exercise outside of rehab.       MET Level  Group instruction provided by PowerPoint, verbal discussion, and written material to support subject matter. Instructor reviews what METs are and how to increase METs.    Pulmonary Medications Verbally interactive group education provided by instructor with focus on inhaled medications and proper administration. Flowsheet Row PULMONARY REHAB CHRONIC OBSTRUCTIVE PULMONARY DISEASE from 03/25/2022 in [facility] for Heart, Vascular, & Lung Health  Date 03/25/22  Educator EP  Instruction Review Code 1- Verbalizes Understanding       Anatomy and Physiology of the Respiratory System Group instruction provided by PowerPoint, verbal discussion, and written material to support subject matter. Instructor reviews respiratory cycle and anatomical components of the respiratory system and their functions. Instructor also reviews differences in obstructive and restrictive respiratory diseases with examples of each.    Oxygen Safety Group instruction provided by PowerPoint, verbal discussion, and written material to support subject matter. There is an overview of "What is Oxygen" and "Why do we need it".  Instructor also reviews how to create a safe environment for oxygen use, the importance of using oxygen as prescribed, and the risks of noncompliance. There is a brief discussion on traveling  with oxygen and resources the patient may utilize.   Oxygen Use Group instruction provided by PowerPoint, verbal discussion, and written material to discuss how supplemental oxygen is prescribed and different types of oxygen supply systems. Resources for more information are provided.    Breathing Techniques Group instruction that is supported by demonstration and informational handouts. Instructor discusses the benefits of pursed lip and diaphragmatic breathing and detailed demonstration on how to perform both.     Risk Factor Reduction Group instruction that is supported by a PowerPoint presentation. Instructor discusses the definition of a risk factor, different risk factors for pulmonary disease, and how the heart and lungs work together.   MD Day A group question and answer session with a medical doctor that allows participants to ask questions that relate to their pulmonary disease state.   Nutrition for the Pulmonary Patient Group instruction provided by PowerPoint slides, verbal discussion, and written materials to support subject matter. The instructor gives an explanation and review of healthy diet recommendations, which includes a discussion on weight management, recommendations for fruit and vegetable consumption, as well as protein, fluid, caffeine, fiber, sodium, sugar, and alcohol. Tips for eating when patients are short of breath are discussed.    Other Education Group or individual verbal, written, or video instructions that support the educational goals of the pulmonary rehab program.  Knowledge Questionnaire Score:  Knowledge Questionnaire Score - 02/26/22 1456       Knowledge Questionnaire Score   Pre Score 16/18             Core Components/Risk Factors/Patient Goals at Admission:  Personal Goals and Risk Factors at Admission - 02/26/22 1117       Core Components/Risk Factors/Patient Goals on Admission   Improve shortness of breath with ADL's Yes     Intervention Provide education, individualized exercise plan and daily activity instruction to help decrease symptoms of SOB with activities of daily living.    Expected Outcomes Short Term: Improve cardiorespiratory fitness to achieve a reduction of symptoms when performing ADLs    Increase knowledge of respiratory medications and ability to use respiratory devices properly  Yes    Intervention Provide education and demonstration as needed of appropriate use of medications, inhalers, and oxygen therapy.    Expected Outcomes Short Term: Achieves understanding of medications use. Understands that oxygen is a medication prescribed by physician. Demonstrates appropriate use of inhaler and oxygen therapy.;Long Term: Maintain appropriate use of medications, inhalers, and oxygen therapy.             Core Components/Risk Factors/Patient Goals Review:   Goals and Risk Factor Review     Row Name 03/02/22 0936 03/30/22 0906           Core Components/Risk Factors/Patient Goals Review   Personal Goals Review Improve shortness of breath with ADL's;Develop more efficient breathing techniques such as purse lipped breathing and diaphragmatic breathing and practicing self-pacing with activity.;Increase knowledge of respiratory medications and ability to use respiratory devices properly. Improve shortness of breath with ADL's;Develop more efficient breathing techniques such as purse lipped breathing and diaphragmatic breathing and practicing self-pacing with activity.;Increase knowledge of respiratory medications and ability to use respiratory devices properly.      Review Astria came in for pulmonary rehab orientation last Friday, 12/15. She is scheduled to attend her first class on Thursday, 12/21. We look forward to educating her on breathing techniques, increase her knowledge of when and how to use her inhaler, and improve her shortness of breath while exercising and improving her endurance and stamina in  everyday activities. Gisela has completed 3 sessions of PR so far. She is currently exercising on the recumbent bike and the NuStep. She is working on her stamina and endurance. She is practicing pursed lip breathing when she gets short of breath. We have encouraged her to continue practicing at home. Shaleena has attended education about pulmonary medications. Flois enjoys coming to class and exercising.      Expected Outcomes See admission goals See admission goals               Core Components/Risk Factors/Patient Goals at Discharge (Final Review):   Goals and Risk Factor Review - 03/30/22 0906       Core Components/Risk Factors/Patient Goals Review   Personal Goals Review Improve shortness of breath with ADL's;Develop more efficient breathing techniques such as purse lipped breathing and diaphragmatic breathing and practicing self-pacing with activity.;Increase knowledge of respiratory medications and ability to use respiratory devices properly.    Review Darleene has completed 3 sessions of PR so far. She is currently exercising on the recumbent bike and the NuStep. She is working on her stamina and endurance. She is practicing pursed lip breathing when she gets short of breath. We have encouraged her to continue practicing at home. [place] has attended education about pulmonary medications. Rossi enjoys  coming to class and exercising.    Expected Outcomes See admission goals             ITP Comments:   Comments: Pt is making expected progress toward Pulmonary Rehab goals after completing 4 sessions. Recommend continued exercise, life style modification, education, and utilization of breathing techniques to increase stamina and strength, while also decreasing shortness of breath with exertion.  Dr. Mechele Collin is Medical Director for Pulmonary Rehab at [facility].

## 2022-04-01 ENCOUNTER — Encounter (HOSPITAL_COMMUNITY)
Admission: RE | Admit: 2022-04-01 | Discharge: 2022-04-01 | Disposition: A | Discharge status: Home / Self Care | Payer: Medicare PPO | Source: Ambulatory Visit | Attending: Pulmonary Disease | Admitting: Pulmonary Disease

## 2022-04-01 DIAGNOSIS — R0609 Other forms of dyspnea: Secondary | ICD-10-CM | POA: Diagnosis not present

## 2022-04-01 DIAGNOSIS — U099 Post covid-19 condition, unspecified: Secondary | ICD-10-CM

## 2022-04-01 NOTE — Progress Notes (Signed)
Daily Session Note  Patient Details  Name: Jessica Woodard MRN: [id number] Date of Birth: May 31, 1950 Referring Provider:   April Manson Pulmonary Rehab Walk Test from 02/26/2022 in [facility] for Heart, Vascular, & [place] [facility]  Referring Provider Loanne Drilling       Encounter Date: 04/01/2022  Check In:  Session Check In - 04/01/22 1540       Check-In   Supervising physician immediately available to respond to emergencies [facility] MD immediately available    Physician(s) Shearon Stalls, [place]    Location [facility]    Staff Present Janine Ores, RN, Quentin Ore, MS, ACSM-CEP, Exercise Physiologist;Randi Olen Cordial BS, ACSM-CEP, Exercise Physiologist;Other    Virtual Visit No    Medication changes reported     No    Fall or balance concerns reported    No    Tobacco Cessation No Change    Warm-up and Cool-down Performed as group-led instruction    Resistance Training Performed Yes    VAD Patient? No    PAD/SET Patient? No      Pain Assessment   Currently in Pain? No/denies    Multiple Pain Sites No             Capillary Blood Glucose: No results found for this or any previous visit (from the past 24 hour(s)).    Social History   Tobacco Use  Smoking Status Never  Smokeless Tobacco Never    Goals Met:  Proper associated with RPD/PD & O2 Sat Exercise tolerated well No report of concerns or symptoms today Strength training completed today  Goals Unmet:  Not Applicable  Comments: Service time is from 1319 to 1500.    Dr. Rodman Pickle is Medical Director for Pulmonary Rehab at [facility].

## 2022-04-06 ENCOUNTER — Encounter (HOSPITAL_COMMUNITY)
Admission: RE | Admit: 2022-04-06 | Discharge: 2022-04-06 | Disposition: A | Discharge status: Home / Self Care | Payer: Medicare PPO | Source: Ambulatory Visit | Attending: Pulmonary Disease | Admitting: Pulmonary Disease

## 2022-04-06 DIAGNOSIS — U099 Post covid-19 condition, unspecified: Secondary | ICD-10-CM | POA: Diagnosis not present

## 2022-04-06 DIAGNOSIS — R0609 Other forms of dyspnea: Secondary | ICD-10-CM | POA: Diagnosis not present

## 2022-04-06 NOTE — Progress Notes (Signed)
Daily Session Note  Patient Details  Name: LOWANDA CASHAW MRN: [id number] Date of Birth: December 10, 1950 Referring Provider:   April Manson Pulmonary Rehab Walk Test from 02/26/2022 in [facility] for Heart, Vascular, & [place]  Referring Provider Loanne Drilling       Encounter Date: 04/06/2022  Check In:  Session Check In - 04/06/22 1427       Check-In   Supervising physician immediately available to respond to emergencies [facility] MD immediately available    Physician(s) Anselm Lis, NP    Location [facility]    Staff Present Janine Ores, RN, Quentin Ore, MS, ACSM-CEP, Exercise Physiologist;Shayon Trompeter Yevonne Pax, ACSM-CEP, Exercise Physiologist;Other;Samantha [place], [place], LDN    Virtual Visit No    Medication changes reported     No    Fall or balance concerns reported    No    Tobacco Cessation No Change    Warm-up and Cool-down Performed as group-led instruction    Resistance Training Performed Yes    VAD Patient? No    PAD/SET Patient? No      Pain Assessment   Currently in Pain? No/denies    Multiple Pain Sites No             Capillary Blood Glucose: No results found for this or any previous visit (from the past 24 hour(s)).   Exercise Prescription Changes - 04/06/22 1500       Home Exercise Plan   Plans to continue exercise at Home (comment)   walking   Frequency Add 2 additional days to program exercise sessions.    Initial Home Exercises Provided 04/06/22             Social History   Tobacco Use  Smoking Status Never  Smokeless Tobacco Never    Goals Met:  Independence with exercise equipment Exercise tolerated well No report of concerns or symptoms today Strength training completed today  Goals Unmet:  Not Applicable  Comments: Service time is from 1325 to 1445.    Dr. Rodman Pickle is Medical Director for Pulmonary Rehab at [facility].

## 2022-04-06 NOTE — Progress Notes (Signed)
Home Exercise Prescription I have reviewed a Home Exercise Prescription with Jessica Woodard. She is currently walking one day a week 20 min in her home. Encouraged pt to add time to 30 min or more and also try to add 1-2 more days if possible. she agreed and wants to build back up to her pre-covid ability. The patient stated that their goals were to build her stamina up again. We reviewed exercise guidelines, target heart rate during exercise, RPE Scale, weather conditions, endpoints for exercise, warmup and cool down. The patient is encouraged to come to me with any questions. I will continue to follow up with the patient to assist them with progression and safety.    Jessica Woodard, [place], ACSM-CEP 04/06/2022 3:11 PM

## 2022-04-07 DIAGNOSIS — G629 Polyneuropathy, unspecified: Secondary | ICD-10-CM | POA: Diagnosis not present

## 2022-04-07 DIAGNOSIS — E119 Type 2 diabetes mellitus without complications: Secondary | ICD-10-CM | POA: Diagnosis not present

## 2022-04-07 DIAGNOSIS — M2041 Other hammer toe(s) (acquired), right foot: Secondary | ICD-10-CM | POA: Diagnosis not present

## 2022-04-07 DIAGNOSIS — M2042 Other hammer toe(s) (acquired), left foot: Secondary | ICD-10-CM | POA: Diagnosis not present

## 2022-04-07 DIAGNOSIS — I739 Peripheral vascular disease, unspecified: Secondary | ICD-10-CM | POA: Diagnosis not present

## 2022-04-07 DIAGNOSIS — L84 Corns and callosities: Secondary | ICD-10-CM | POA: Diagnosis not present

## 2022-04-08 ENCOUNTER — Encounter (HOSPITAL_COMMUNITY)
Admission: RE | Admit: 2022-04-08 | Discharge: 2022-04-08 | Disposition: A | Discharge status: Home / Self Care | Payer: Medicare PPO | Source: Ambulatory Visit | Attending: Pulmonary Disease | Admitting: Pulmonary Disease

## 2022-04-08 DIAGNOSIS — U099 Post covid-19 condition, unspecified: Secondary | ICD-10-CM | POA: Diagnosis not present

## 2022-04-08 DIAGNOSIS — R0609 Other forms of dyspnea: Secondary | ICD-10-CM | POA: Diagnosis not present

## 2022-04-08 NOTE — Progress Notes (Signed)
Daily Session Note  Patient Details  Name: Jessica Woodard MRN: [id number] Date of Birth: Aug 28, 1950 Referring Provider:   April Manson Pulmonary Rehab Walk Test from 02/26/2022 in [facility] for Heart, Vascular, & [place]  Referring Provider Loanne Drilling       Encounter Date: 04/08/2022  Check In:  Session Check In - 04/08/22 1314       Check-In   Supervising physician immediately available to respond to emergencies [facility] MD immediately available    Physician(s) Dr. Marshell Garfinkel    [facility]    Staff Present Janine Ores, RN, Quentin Ore, MS, ACSM-CEP, Exercise Physiologist;Randi Yevonne Pax, ACSM-CEP, Exercise Physiologist;Other    Virtual Visit No    Medication changes reported     No    Fall or balance concerns reported    No    Tobacco Cessation No Change    Warm-up and Cool-down Performed as group-led instruction    Resistance Training Performed Yes    VAD Patient? No    PAD/SET Patient? No      Pain Assessment   Currently in Pain? No/denies    Multiple Pain Sites No             Capillary Blood Glucose: No results found for this or any previous visit (from the past 24 hour(s)).    Social History   Tobacco Use  Smoking Status Never  Smokeless Tobacco Never    Goals Met:  Independence with exercise equipment Improved SOB with ADL's Exercise tolerated well No report of concerns or symptoms today Strength training completed today  Goals Unmet:  Not Applicable  Comments: Service time is from 1303 to 1445    Dr. Rodman Pickle is Medical Director for Pulmonary Rehab at [facility].

## 2022-04-13 ENCOUNTER — Encounter (HOSPITAL_COMMUNITY)
Admission: RE | Admit: 2022-04-13 | Discharge: 2022-04-13 | Disposition: A | Discharge status: Home / Self Care | Payer: Medicare PPO | Source: Ambulatory Visit | Attending: Pulmonary Disease | Admitting: Pulmonary Disease

## 2022-04-13 VITALS — Wt 135.1 lb

## 2022-04-13 DIAGNOSIS — U099 Post covid-19 condition, unspecified: Secondary | ICD-10-CM | POA: Diagnosis not present

## 2022-04-13 DIAGNOSIS — R0609 Other forms of dyspnea: Secondary | ICD-10-CM

## 2022-04-13 NOTE — Progress Notes (Signed)
Daily Session Note  Patient Details  Name: Jessica Woodard MRN: [id number] Date of Birth: [birth year] Referring Provider:   April Manson Pulmonary Rehab Walk Test from 02/26/2022 in [facility] & [place]  Referring Provider Loanne Drilling       Encounter Date: 04/13/2022  Check In:  Session Check In - 04/13/22 1439       Check-In   Supervising physician immediately available to respond to emergencies [facility] MD immediately available    Physician(s) Dr. Marshell Garfinkel    Location [facility]    Staff Present Janine Ores, RN, Quentin Ore, MS, ACSM-CEP, Exercise Physiologist;Randi Yevonne Pax, ACSM-CEP, Exercise Physiologist;Other;Samantha [place], [place], LDN    Virtual Visit No    Medication changes reported     No    Fall or balance concerns reported    No    Tobacco Cessation No Change    Warm-up and Cool-down Performed as group-led instruction    Resistance Training Performed Yes    VAD Patient? No    PAD/SET Patient? No      Pain Assessment   Currently in Pain? No/denies    Multiple Pain Sites No             Capillary Blood Glucose: No results found for this or any previous visit (from the past 24 hour(s)).   Exercise Prescription Changes - 04/13/22 1500       Response to Exercise   Blood Pressure (Admit) 102/58    Blood Pressure (Exercise) 118/60    Blood Pressure (Exit) 94/62    Heart Rate (Admit) 69 bpm    Heart Rate (Exercise) 83 bpm    Heart Rate (Exit) 69 bpm    Oxygen Saturation (Admit) 100 %    Oxygen Saturation (Exercise) 98 %    Oxygen Saturation (Exit) 100 %    Rating of Perceived Exertion (Exercise) 11    Perceived Dyspnea (Exercise) 0    Duration Continue with 30 min of aerobic exercise without signs/symptoms of physical distress.    Intensity THRR unchanged      Progression   Progression Continue to progress workloads to maintain intensity without signs/symptoms of physical distress.       Resistance Training   Training Prescription Yes    Weight Red Bands    Reps 10-15    Time 10 Minutes      Recumbant Bike   Level 3    Minutes 15    METs 2.6      NuStep   Level 3    SPM 60    Minutes 15    METs 1.9             Social History   Tobacco Use  Smoking Status Never  Smokeless Tobacco Never    Goals Met:  Independence with exercise equipment Exercise tolerated well No report of concerns or symptoms today Strength training completed today  Goals Unmet:  Not Applicable  Comments: Service time is from 1314 to 1446    Dr. Rodman Pickle is Medical Director for Pulmonary Rehab at [facility].

## 2022-04-15 ENCOUNTER — Encounter (HOSPITAL_COMMUNITY)
Admission: RE | Admit: 2022-04-15 | Discharge: 2022-04-15 | Disposition: A | Discharge status: Home / Self Care | Payer: Medicare PPO | Source: Ambulatory Visit | Attending: Pulmonary Disease | Admitting: Pulmonary Disease

## 2022-04-15 DIAGNOSIS — U099 Post covid-19 condition, unspecified: Secondary | ICD-10-CM | POA: Insufficient documentation

## 2022-04-15 DIAGNOSIS — R0609 Other forms of dyspnea: Secondary | ICD-10-CM

## 2022-04-15 NOTE — Progress Notes (Signed)
Daily Session Note  Patient Details  Name: Jessica Woodard MRN: [id number] Date of Birth: 05/10/1950 Referring Provider:   April Manson Pulmonary Rehab Walk Test from 02/26/2022 in [facility]  Referring Provider Loanne Drilling       Encounter Date: 04/15/2022  Check In:  Session Check In - 04/15/22 1337       Check-In   Supervising physician immediately available to respond to emergencies [facility] immediately available    Physician(s) Dr. Marshell Garfinkel    [facility]    Staff Present Janine Ores, RN, Quentin Ore, MS, ACSM-CEP, Exercise Physiologist;Randi Yevonne Pax, ACSM-CEP, Exercise Physiologist;Other;Samantha [place], [place], LDN    Virtual Visit No    Medication changes reported     No    Fall or balance concerns reported    No    Tobacco Cessation No Change    Warm-up and Cool-down Performed as group-led instruction    Resistance Training Performed Yes    VAD Patient? No    PAD/SET Patient? No             Capillary Blood Glucose: No results found for this or any previous visit (from the past 24 hour(s)).    Social History   Tobacco Use  Smoking Status Never  Smokeless Tobacco Never    Goals Met:  Proper associated with RPD/PD & O2 Sat Exercise tolerated well No report of concerns or symptoms today Strength training completed today  Goals Unmet:  Not Applicable  Comments: Service time is from 1310 to 1450.    Dr. Rodman Pickle is Medical Director for Pulmonary Rehab at [facility].

## 2022-04-20 ENCOUNTER — Encounter (HOSPITAL_COMMUNITY)
Admission: RE | Admit: 2022-04-20 | Discharge: 2022-04-20 | Disposition: A | Discharge status: Home / Self Care | Payer: Medicare PPO | Source: Ambulatory Visit | Attending: Pulmonary Disease | Admitting: Pulmonary Disease

## 2022-04-20 DIAGNOSIS — U099 Post covid-19 condition, unspecified: Secondary | ICD-10-CM

## 2022-04-20 DIAGNOSIS — R0609 Other forms of dyspnea: Secondary | ICD-10-CM | POA: Diagnosis not present

## 2022-04-20 NOTE — Progress Notes (Signed)
Daily Session Note  Patient Details  Name: Jessica Woodard MRN: [id number] Date of Birth: 1951/03/15 Referring Provider:   April Manson Pulmonary Rehab Walk Test from 02/26/2022 in [facility] V[facility], [facility]  Referring Provider Loanne Drilling       Encounter Date: 04/20/2022  Check In:  Session Check In - 04/20/22 1430       Check-In   Supervising physician immediately available to respond to emergencies [facility] MD immediately available    Physician(s) Clung    [facility]    Staff Present Janine Ores, RN, Quentin Ore, MS, ACSM-CEP, Exercise Physiologist;Randi Olen Cordial BS, ACSM-CEP, Exercise Physiologist;Other;Samantha [place], RD, LDN    Virtual Visit No    Medication changes reported     No    Fall or balance concerns reported    No    Tobacco Cessation No Change    Warm-up and Cool-down Performed as group-led instruction    Resistance Training Performed Yes    VAD Patient? No    PAD/SET Patient? No      Pain Assessment   Currently in Pain? No/denies    Multiple Pain Sites No             Capillary Blood Glucose: No results found for this or any previous visit (from the past 24 hour(s)).    Social History   Tobacco Use  Smoking Status Never  Smokeless Tobacco Never    Goals Met:  Proper associated with RPD/PD & O2 Sat Independence with exercise equipment Exercise tolerated well No report of concerns or symptoms today Strength training completed today  Goals Unmet:  Not Applicable  Comments: Service time is from 1302 to 1440.    Dr. Rodman Pickle is Medical Director for Pulmonary Rehab at [facility].

## 2022-04-21 DIAGNOSIS — H0102A Squamous blepharitis right eye, upper and lower eyelids: Secondary | ICD-10-CM | POA: Diagnosis not present

## 2022-04-21 DIAGNOSIS — D869 Sarcoidosis, unspecified: Secondary | ICD-10-CM | POA: Diagnosis not present

## 2022-04-21 DIAGNOSIS — H0102B Squamous blepharitis left eye, upper and lower eyelids: Secondary | ICD-10-CM | POA: Diagnosis not present

## 2022-04-21 DIAGNOSIS — H2513 Age-related nuclear cataract, bilateral: Secondary | ICD-10-CM | POA: Diagnosis not present

## 2022-04-21 DIAGNOSIS — E119 Type 2 diabetes mellitus without complications: Secondary | ICD-10-CM | POA: Diagnosis not present

## 2022-04-21 DIAGNOSIS — H02885 Meibomian gland dysfunction left lower eyelid: Secondary | ICD-10-CM | POA: Diagnosis not present

## 2022-04-21 DIAGNOSIS — H40013 Open angle with borderline findings, low risk, bilateral: Secondary | ICD-10-CM | POA: Diagnosis not present

## 2022-04-21 DIAGNOSIS — H1045 Other chronic allergic conjunctivitis: Secondary | ICD-10-CM | POA: Diagnosis not present

## 2022-04-21 DIAGNOSIS — H532 Diplopia: Secondary | ICD-10-CM | POA: Diagnosis not present

## 2022-04-22 ENCOUNTER — Encounter (HOSPITAL_COMMUNITY)
Admission: RE | Admit: 2022-04-22 | Discharge: 2022-04-22 | Disposition: A | Discharge status: Home / Self Care | Payer: Medicare PPO | Source: Ambulatory Visit | Attending: Pulmonary Disease | Admitting: Pulmonary Disease

## 2022-04-22 DIAGNOSIS — R0609 Other forms of dyspnea: Secondary | ICD-10-CM

## 2022-04-22 DIAGNOSIS — U099 Post covid-19 condition, unspecified: Secondary | ICD-10-CM

## 2022-04-22 NOTE — Progress Notes (Signed)
Daily Session Note  Patient Details  Name: Jessica Woodard MRN: [id number] Date of Birth: 04/16/1950 Referring Provider:   April Manson Pulmonary Rehab Walk Test from 02/26/2022 in [facility]  Referring Provider Loanne Drilling       Encounter Date: 04/22/2022  Check In:  Session Check In - 04/22/22 1451       Check-In   Supervising physician immediately available to respond to emergencies [facility] immediately available    Physician(s) Dr Vaughan Browner    [facility]    Staff Present Janine Ores, RN, Quentin Ore, MS, ACSM-CEP, Exercise Physiologist;Samantha [place], RD, LDN;Jetta Walker BS, ACSM-CEP, Exercise Physiologist;Other    Virtual Visit No    Medication changes reported     No    Fall or balance concerns reported    No    Tobacco Cessation No Change    Warm-up and Cool-down Performed as group-led instruction    Resistance Training Performed Yes    VAD Patient? No    PAD/SET Patient? No      Pain Assessment   Currently in Pain? No/denies    Multiple Pain Sites No             Capillary Blood Glucose: No results found for this or any previous visit (from the past 24 hour(s)).    Social History   Tobacco Use  Smoking Status Never  Smokeless Tobacco Never    Goals Met:  Proper associated with RPD/PD & O2 Sat Independence with exercise equipment Exercise tolerated well No report of concerns or symptoms today Strength training completed today  Goals Unmet:  Not Applicable  Comments: Service time is from 1308 to 1440.    Dr. Rodman Pickle is Medical Director for Pulmonary Rehab at [facility].

## 2022-04-27 ENCOUNTER — Encounter (HOSPITAL_COMMUNITY): Payer: Self-pay

## 2022-04-27 ENCOUNTER — Encounter (HOSPITAL_COMMUNITY)
Admission: RE | Admit: 2022-04-27 | Discharge: 2022-04-27 | Disposition: A | Discharge status: Home / Self Care | Payer: Medicare PPO | Source: Ambulatory Visit | Attending: Pulmonary Disease | Admitting: Pulmonary Disease

## 2022-04-27 VITALS — Wt 134.9 lb

## 2022-04-27 DIAGNOSIS — R0609 Other forms of dyspnea: Secondary | ICD-10-CM | POA: Diagnosis not present

## 2022-04-27 DIAGNOSIS — U099 Post covid-19 condition, unspecified: Secondary | ICD-10-CM

## 2022-04-27 NOTE — Progress Notes (Signed)
Daily Session Note  Patient Details  Name: LYBERTY MOSS MRN: [id number] Date of Birth: [birth year] Referring Provider:   April Manson Pulmonary Rehab Walk Test from 02/26/2022 in [facility], V[facility], & [place]  Referring Provider Loanne Drilling       Encounter Date: 04/27/2022  Check In:  Session Check In - 04/27/22 1541       Check-In   Supervising physician immediately available to respond to emergencies [facility] immediately available    Physician(s) Sharolyn Douglas    [facility]    Staff Present Janine Ores, RN, Quentin Ore, MS, ACSM-CEP, Exercise Physiologist;Randi Yevonne Pax, ACSM-CEP, Exercise Physiologist;Carlette Wilber Oliphant, RN, BSN    Virtual Visit No    Medication changes reported     No    Fall or balance concerns reported    No    Tobacco Cessation No Change    Warm-up and Cool-down Performed as group-led instruction    Resistance Training Performed Yes    VAD Patient? No    PAD/SET Patient? No      Pain Assessment   Currently in Pain? No/denies    Multiple Pain Sites No             Capillary Blood Glucose: No results found for this or any previous visit (from the past 24 hour(s)).   Exercise Prescription Changes - 04/27/22 1500       Response to Exercise   Blood Pressure (Admit) 99/63    Blood Pressure (Exercise) 112/62    Blood Pressure (Exit) 90/60    Heart Rate (Admit) 69 bpm    Heart Rate (Exercise) 82 bpm    Heart Rate (Exit) 68 bpm    Oxygen Saturation (Admit) 100 %    Oxygen Saturation (Exercise) 98 %    Oxygen Saturation (Exit) 99 %    Rating of Perceived Exertion (Exercise) 11    Perceived Dyspnea (Exercise) 0    Duration Continue with 30 min of aerobic exercise without signs/symptoms of physical distress.    Intensity THRR unchanged      Progression   Progression Continue to progress workloads to maintain intensity without signs/symptoms of physical distress.      Resistance  Training   Training Prescription Yes    Weight Red Bands    Reps 10-15    Time 10 Minutes      Recumbant Bike   Level 2.5   changed to a different recumant bike (reduced level from 4 to 2.5)   Minutes 15    METs 1.8      NuStep   Level 4    Minutes 15    METs 2             Social History   Tobacco Use  Smoking Status Never  Smokeless Tobacco Never    Goals Met:  Independence with exercise equipment Exercise tolerated well No report of concerns or symptoms today Strength training completed today  Goals Unmet:  Not Applicable  Comments: Service time is from 1321 to 1456    Dr. Rodman Pickle is Medical Director for Pulmonary Rehab at [facility].

## 2022-04-28 NOTE — Progress Notes (Signed)
Pulmonary Individual Treatment Plan  Patient Details  Name: Jessica Woodard MRN: [id number] Date of Birth: 1951-02-03 Referring Provider:   April Manson Pulmonary Rehab Walk Test from 02/26/2022 in [facility] & [place]  Referring Provider Loanne Drilling       Initial Encounter Date:  Flowsheet Row Pulmonary Rehab Walk Test from 02/26/2022 in [facility], & [place]  Date 02/26/22       Visit Diagnosis: Post covid-19 condition, unspecified  DOE (dyspnea on exertion)  Patient's Home Medications on Admission:   Current Outpatient Medications:    albuterol (VENTOLIN HFA) 108 (90 Base) MCG/ACT inhaler, Inhale 2 puffs into the lungs every 6 (six) hours as needed for wheezing or shortness of breath., Disp: 1 each, Rfl: 3   APPLE CIDER VINEGAR PO, See admin instructions., Disp: , Rfl:    Ascorbic Acid (VITAMIN C) 100 MG tablet, Take 100 mg by mouth daily., Disp: , Rfl:    aspirin 81 MG tablet, Take 81 mg by mouth daily., Disp: , Rfl:    Cholecalciferol (VITAMIN D3) 250 MCG (10000 UT) capsule, 2 capsules, Disp: , Rfl:    ciclesonide (ALVESCO) 80 MCG/ACT inhaler, Inhale 1 puff into the lungs 2 (two) times daily., Disp: , Rfl:    cyanocobalamin (VITAMIN B12) 1000 MCG tablet, Take 1,000 mcg by mouth daily., Disp: , Rfl:    lansoprazole (PREVACID) 30 MG capsule, Take 30 mg by mouth 2 (two) times daily before a meal., Disp: , Rfl:    Melatonin 5 MG/15ML LIQD, Take 5 mg by mouth at bedtime., Disp: , Rfl:    metFORMIN (GLUCOPHAGE-XR) 500 MG 24 hr tablet, , Disp: , Rfl:    metoprolol (TOPROL-XL) 50 MG 24 hr tablet, Take 75 mg by mouth daily., Disp: , Rfl:    Misc Natural Products (ELDERBERRY IMMUNE COMPLEX PO), Take 50 mg by mouth daily., Disp: , Rfl:    montelukast (SINGULAIR) 10 MG tablet, Take 10 mg by mouth at bedtime., Disp: , Rfl:    Omega-3 Fatty Acids (FISH OIL) 1200 MG CAPS, Take 1 capsule by mouth  daily., Disp: , Rfl:    OneTouch Delica Lancets [id number] MISC, , Disp: , Rfl:    Propylene Glycol 0.6 % SOLN, Apply to eye., Disp: , Rfl:    rosuvastatin (CRESTOR) 5 MG tablet, , Disp: , Rfl:    spironolactone (ALDACTONE) 25 MG tablet, , Disp: , Rfl:    telmisartan (MICARDIS) 40 MG tablet, TAKE 1 TABLET BY MOUTH EVERY DAY FOR BLOOD PRESSURE, Disp: , Rfl:    valACYclovir (VALTREX) 500 MG tablet, Take 1 tablet by mouth Daily., Disp: , Rfl:   Past Medical History: Past Medical History:  Diagnosis Date   Allergic rhinitis, cause unspecified    Allergy    seasonal    Anxiety    sleep walking/night terrors   Asthma    inhalers   BRONCHITIS, ACUTE 04/29/2009   Qualifier: Diagnosis of  By: Annamaria Boots MD, Clinton D    GERD (gastroesophageal reflux disease)    Hypertension    Sarcoid     Tobacco Use: Social History   Tobacco Use  Smoking Status Never  Smokeless Tobacco Never    Labs: Review Flowsheet        No data to display          Capillary Blood Glucose: Lab Results  Component Value Date   GLUCAP 117 (H) 03/23/2022   GLUCAP 119 (  H) 03/23/2022   GLUCAP 158 (H) 03/16/2022   GLUCAP 113 (H) 03/16/2022   GLUCAP 99 02/26/2022     Pulmonary Assessment Scores:  Pulmonary Assessment Scores     Row Name 02/26/22 1122 04/23/22 1433       ADL UCSD   ADL Phase Entry Exit    SOB Score total 65 46      CAT Score   CAT Score 24 20      mMRC Score   mMRC Score 2 --            UCSD: Self-administered rating of dyspnea associated with activities of daily living (ADLs) 6-point scale (0 = "not at all" to 5 = "maximal or unable to do because of breathlessness")  Scoring Scores range from 0 to 120.  Minimally important difference is 5 units  CAT: CAT can identify the health impairment of COPD patients and is better correlated with disease progression.  CAT has a scoring range of zero to 40. The CAT score is classified into four groups of low (less than 10), medium (10 -  20), high (21-30) and very high (31-40) based on the impact level of disease on health status. A CAT score over 10 suggests significant symptoms.  A worsening CAT score could be explained by an exacerbation, poor medication adherence, poor inhaler technique, or progression of COPD or comorbid conditions.  CAT MCID is 2 points  mMRC: mMRC (Modified Medical Research Council) Dyspnea Scale is used to assess the degree of baseline functional disability in patients of respiratory disease due to dyspnea. No minimal important difference is established. A decrease in score of 1 point or greater is considered a positive change.   Pulmonary Function Assessment:  Pulmonary Function Assessment - 02/26/22 1455       Breath   Bilateral Breath Sounds Clear    Shortness of Breath Yes;Limiting activity             Exercise Target Goals: Exercise Program Goal: Individual exercise prescription set using results from initial 6 min walk test and THRR while considering  patient's activity barriers and safety.   Exercise Prescription Goal: Initial exercise prescription builds to 30-45 minutes a day of aerobic activity, 2-3 days per week.  Home exercise guidelines will be given to patient during program as part of exercise prescription that the participant will acknowledge.  Activity Barriers & Risk Stratification:  Activity Barriers & Cardiac Risk Stratification - 02/26/22 1123       Activity Barriers & Cardiac Risk Stratification   Activity Barriers Deconditioning;Muscular Weakness;Shortness of Breath;Balance Concerns;History of Falls             6 Minute Walk:  6 Minute Walk     Row Name 02/26/22 1450         6 Minute Walk   Phase Initial     Distance 960 feet     Walk Time 6 minutes     # of Rest Breaks 0     MPH 1.82     METS 1.93     RPE 13     Perceived Dyspnea  2     VO2 Peak 6.75     Symptoms No     Resting HR 61 bpm     Resting BP 94/59     Resting Oxygen Saturation  98  %     Exercise Oxygen Saturation  during 6 min walk 94 %     Max Ex. HR 75 bpm  Max Ex. BP 110/62     2 Minute Post BP 107/64       Interval HR   1 Minute HR 57     2 Minute HR 70     3 Minute HR 74     4 Minute HR 75     5 Minute HR 75     6 Minute HR 74     2 Minute Post HR 56     Interval Heart Rate? Yes       Interval Oxygen   Interval Oxygen? Yes     Baseline Oxygen Saturation % 98 %     1 Minute Oxygen Saturation % 100 %     1 Minute Liters of Oxygen 0 L     2 Minute Oxygen Saturation % 100 %     2 Minute Liters of Oxygen 0 L     3 Minute Oxygen Saturation % 97 %     3 Minute Liters of Oxygen 0 L     4 Minute Oxygen Saturation % 95 %     4 Minute Liters of Oxygen 0 L     5 Minute Oxygen Saturation % 94 %     5 Minute Liters of Oxygen 0 L     6 Minute Oxygen Saturation % 98 %     6 Minute Liters of Oxygen 0 L     2 Minute Post Oxygen Saturation % 100 %     2 Minute Post Liters of Oxygen 0 L              Oxygen Initial Assessment:  Oxygen Initial Assessment - 02/26/22 1120       Home Oxygen   Home Oxygen Device None    Sleep Oxygen Prescription None    Home Exercise Oxygen Prescription None    Home Resting Oxygen Prescription None      Initial 6 min Walk   Oxygen Used None      Program Oxygen Prescription   Program Oxygen Prescription None      Intervention   Short Term Goals To learn and understand importance of maintaining oxygen saturations>88%;To learn and demonstrate proper use of respiratory medications;To learn and understand importance of monitoring SPO2 with pulse oximeter and demonstrate accurate use of the pulse oximeter.;To learn and demonstrate proper pursed lip breathing techniques or other breathing techniques.     Long  Term Goals Verbalizes importance of monitoring SPO2 with pulse oximeter and return demonstration;Maintenance of O2 saturations>88%;Exhibits proper breathing techniques, such as pursed lip breathing or other method taught  during program session;Compliance with respiratory medication;Demonstrates proper use of MDI's             Oxygen Re-Evaluation:  Oxygen Re-Evaluation     Row Name 03/01/22 1446 03/30/22 0838 04/19/22 1101         Program Oxygen Prescription   Program Oxygen Prescription None None None       Home Oxygen   Home Oxygen Device None None None     Sleep Oxygen Prescription None None None     Home Exercise Oxygen Prescription None None None     Home Resting Oxygen Prescription None None None       Goals/Expected Outcomes   Short Term Goals To learn and understand importance of maintaining oxygen saturations>88%;To learn and demonstrate proper use of respiratory medications;To learn and understand importance of monitoring SPO2 with pulse oximeter and demonstrate accurate use of the pulse oximeter.;To learn and  demonstrate proper pursed lip breathing techniques or other breathing techniques.  To learn and understand importance of maintaining oxygen saturations>88%;To learn and demonstrate proper use of respiratory medications;To learn and understand importance of monitoring SPO2 with pulse oximeter and demonstrate accurate use of the pulse oximeter.;To learn and demonstrate proper pursed lip breathing techniques or other breathing techniques.  To learn and understand importance of maintaining oxygen saturations>88%;To learn and demonstrate proper use of respiratory medications;To learn and understand importance of monitoring SPO2 with pulse oximeter and demonstrate accurate use of the pulse oximeter.;To learn and demonstrate proper pursed lip breathing techniques or other breathing techniques.      Long  Term Goals Verbalizes importance of monitoring SPO2 with pulse oximeter and return demonstration;Maintenance of O2 saturations>88%;Exhibits proper breathing techniques, such as pursed lip breathing or other method taught during program session;Compliance with respiratory medication;Demonstrates  proper use of MDI's Verbalizes importance of monitoring SPO2 with pulse oximeter and return demonstration;Maintenance of O2 saturations>88%;Exhibits proper breathing techniques, such as pursed lip breathing or other method taught during program session;Compliance with respiratory medication;Demonstrates proper use of MDI's Verbalizes importance of monitoring SPO2 with pulse oximeter and return demonstration;Maintenance of O2 saturations>88%;Exhibits proper breathing techniques, such as pursed lip breathing or other method taught during program session;Compliance with respiratory medication;Demonstrates proper use of MDI's     Comments Will monitor as pt exercises. no change no change     Goals/Expected Outcomes Compliance and understanding of oxygen saturation monitoring and breathing techniques to decrease shortness of breath. Compliance and understanding of oxygen saturation monitoring and breathing techniques to decrease shortness of breath. Compliance and understanding of oxygen saturation monitoring and breathing techniques to decrease shortness of breath. Compliance and understanding of oxygen saturation monitoring and breathing techniques to decrease shortness of breath.              Oxygen Discharge (Final Oxygen Re-Evaluation):  Oxygen Re-Evaluation - 04/19/22 1101       Program Oxygen Prescription   Program Oxygen Prescription None      Home Oxygen   Home Oxygen Device None    Sleep Oxygen Prescription None    Home Exercise Oxygen Prescription None    Home Resting Oxygen Prescription None      Goals/Expected Outcomes   Short Term Goals To learn and understand importance of maintaining oxygen saturations>88%;To learn and demonstrate proper use of respiratory medications;To learn and understand importance of monitoring SPO2 with pulse oximeter and demonstrate accurate use of the pulse oximeter.;To learn and demonstrate proper pursed lip breathing techniques or other breathing  techniques.     Long  Term Goals Verbalizes importance of monitoring SPO2 with pulse oximeter and return demonstration;Maintenance of O2 saturations>88%;Exhibits proper breathing techniques, such as pursed lip breathing or other method taught during program session;Compliance with respiratory medication;Demonstrates proper use of MDI's    Comments no change    Goals/Expected Outcomes Compliance and understanding of oxygen saturation monitoring and breathing techniques to decrease shortness of breath.             Initial Exercise Prescription:  Initial Exercise Prescription - 02/26/22 1400       Date of Initial Exercise RX and Referring Provider   Date 02/26/22    Referring Provider Loanne Drilling    Expected Discharge Date 05/06/22      Recumbant Bike   Level 1    Watts 20    Minutes 15      NuStep   Level 1    SPM 60  Minutes 15      Prescription Details   Frequency (times per week) 2    Duration Progress to 30 minutes of continuous aerobic without signs/symptoms of physical distress      Intensity   THRR 40-80% of Max Heartrate 60-119    Ratings of Perceived Exertion 11-13    Perceived Dyspnea 0-4      Progression   Progression Continue to progress workloads to maintain intensity without signs/symptoms of physical distress.      Resistance Training   Training Prescription Yes    Weight red bands    Reps 10-15             Perform Capillary Blood Glucose checks as needed.  Exercise Prescription Changes:   Exercise Prescription Changes     Row Name 03/16/22 1500 03/30/22 1500 04/06/22 1500 04/13/22 1500 04/27/22 1500     Response to Exercise   Blood Pressure (Admit) 118/58 116/60 -- 102/58 99/63   Blood Pressure (Exercise) 116/56 118/60 -- 118/60 112/62   Blood Pressure (Exit) 100/62 92/58 -- 94/62 90/60   Heart Rate (Admit) 65 bpm 71 bpm -- 69 bpm 69 bpm   Heart Rate (Exercise) 72 bpm 78 bpm -- 83 bpm 82 bpm   Heart Rate (Exit) 63 bpm 71 bpm -- 69 bpm 68  bpm   Oxygen Saturation (Admit) 100 % 98 % -- 100 % 100 %   Oxygen Saturation (Exercise) 97 % 98 % -- 98 % 98 %   Oxygen Saturation (Exit) 99 % 99 % -- 100 % 99 %   Rating of Perceived Exertion (Exercise) 11 11 -- 11 11   Perceived Dyspnea (Exercise) 2 1 -- 0 0   Duration Continue with 30 min of aerobic exercise without signs/symptoms of physical distress. Continue with 30 min of aerobic exercise without signs/symptoms of physical distress. -- Continue with 30 min of aerobic exercise without signs/symptoms of physical distress. Continue with 30 min of aerobic exercise without signs/symptoms of physical distress.   Intensity THRR unchanged THRR unchanged -- THRR unchanged THRR unchanged     Progression   Progression Continue to progress workloads to maintain intensity without signs/symptoms of physical distress. Continue to progress workloads to maintain intensity without signs/symptoms of physical distress. -- Continue to progress workloads to maintain intensity without signs/symptoms of physical distress. Continue to progress workloads to maintain intensity without signs/symptoms of physical distress.     Resistance Training   Training Prescription Yes Yes -- Yes Yes   Weight Red Bands Red Bands -- Red Bands Red Bands   Reps 10-15 10-15 -- 10-15 10-15   Time 10 Minutes 10 Minutes -- 10 Minutes 10 Minutes     Recumbant Bike   Level 1 2 -- 3 2.5  changed to a different recumant bike (reduced level from 4 to 2.5)   Watts 20 -- -- -- --   Minutes 15 15 -- 15 15   METs 2.1 2.6 -- 2.6 1.8     NuStep   Level 1 2 -- 3 4   SPM 60 60 -- 60 --   Minutes 15 15 -- 15 15   METs 1.5 2 -- 1.9 2     Home Exercise Plan   Plans to continue exercise at -- -- Home (comment)  walking -- --   Frequency -- -- Add 2 additional days to program exercise sessions. -- --   Initial Home Exercises Provided -- -- 04/06/22 -- --  Exercise Comments:   Exercise Comments     Row Name 03/16/22  1616 04/06/22 1508         Exercise Comments Pt completed first day of exercise. She exercised for 15 min on the recumbent bike and Nustep. Shalay averaged 2.1 METs at level 1 on the recumbent bike and 1.5 METs at level 1 on the Nustep. She performed the warmup and cooldown standing without limitations. Discussed METs and how to increase METs. Discussed with pt home exercise. She is currently walking one day a week 20 min in her home. Encouraged pt to add time to 30 min or more and also try to add 1-2 more days if possible. she agreed and wants to build back up to her pre-covid ability.               Exercise Goals and Review:   Exercise Goals     Row Name 02/26/22 1454 03/01/22 1444 03/30/22 0818 04/19/22 1058       Exercise Goals   Increase Physical Activity Yes Yes Yes Yes    Intervention Provide advice, education, support and counseling about physical activity/exercise needs.;Develop an individualized exercise prescription for aerobic and resistive training based on initial evaluation findings, risk stratification, comorbidities and participant's personal goals. Provide advice, education, support and counseling about physical activity/exercise needs.;Develop an individualized exercise prescription for aerobic and resistive training based on initial evaluation findings, risk stratification, comorbidities and participant's personal goals. Provide advice, education, support and counseling about physical activity/exercise needs.;Develop an individualized exercise prescription for aerobic and resistive training based on initial evaluation findings, risk stratification, comorbidities and participant's personal goals. Provide advice, education, support and counseling about physical activity/exercise needs.;Develop an individualized exercise prescription for aerobic and resistive training based on initial evaluation findings, risk stratification, comorbidities and participant's personal goals.     Expected Outcomes Short Term: Attend rehab on a regular basis to increase amount of physical activity.;Long Term: Exercising regularly at least 3-5 days a week.;Long Term: Add in home exercise to make exercise part of routine and to increase amount of physical activity. Short Term: Attend rehab on a regular basis to increase amount of physical activity.;Long Term: Exercising regularly at least 3-5 days a week.;Long Term: Add in home exercise to make exercise part of routine and to increase amount of physical activity. Short Term: Attend rehab on a regular basis to increase amount of physical activity.;Long Term: Exercising regularly at least 3-5 days a week.;Long Term: Add in home exercise to make exercise part of routine and to increase amount of physical activity. Short Term: Attend rehab on a regular basis to increase amount of physical activity.;Long Term: Exercising regularly at least 3-5 days a week.;Long Term: Add in home exercise to make exercise part of routine and to increase amount of physical activity.    Increase Strength and Stamina Yes Yes Yes Yes    Intervention Provide advice, education, support and counseling about physical activity/exercise needs.;Develop an individualized exercise prescription for aerobic and resistive training based on initial evaluation findings, risk stratification, comorbidities and participant's personal goals. Provide advice, education, support and counseling about physical activity/exercise needs.;Develop an individualized exercise prescription for aerobic and resistive training based on initial evaluation findings, risk stratification, comorbidities and participant's personal goals. Provide advice, education, support and counseling about physical activity/exercise needs.;Develop an individualized exercise prescription for aerobic and resistive training based on initial evaluation findings, risk stratification, comorbidities and participant's personal goals. Provide  advice, education, support and counseling about physical activity/exercise needs.;Develop  an individualized exercise prescription for aerobic and resistive training based on initial evaluation findings, risk stratification, comorbidities and participant's personal goals.    Expected Outcomes Short Term: Increase workloads from initial exercise prescription for resistance, speed, and METs.;Short Term: Perform resistance training exercises routinely during rehab and add in resistance training at home;Long Term: Improve cardiorespiratory fitness, muscular endurance and strength as measured by increased METs and functional capacity (6MWT) Short Term: Increase workloads from initial exercise prescription for resistance, speed, and METs.;Short Term: Perform resistance training exercises routinely during rehab and add in resistance training at home;Long Term: Improve cardiorespiratory fitness, muscular endurance and strength as measured by increased METs and functional capacity (6MWT) Short Term: Increase workloads from initial exercise prescription for resistance, speed, and METs.;Short Term: Perform resistance training exercises routinely during rehab and add in resistance training at home;Long Term: Improve cardiorespiratory fitness, muscular endurance and strength as measured by increased METs and functional capacity (6MWT) Short Term: Increase workloads from initial exercise prescription for resistance, speed, and METs.;Short Term: Perform resistance training exercises routinely during rehab and add in resistance training at home;Long Term: Improve cardiorespiratory fitness, muscular endurance and strength as measured by increased METs and functional capacity (6MWT)    Able to understand and use rate of perceived exertion (RPE) scale Yes Yes Yes Yes    Intervention Provide education and explanation on how to use RPE scale Provide education and explanation on how to use RPE scale Provide education and explanation on  how to use RPE scale Provide education and explanation on how to use RPE scale    Expected Outcomes Short Term: Able to use RPE daily in rehab to express subjective intensity level;Long Term:  Able to use RPE to guide intensity level when exercising independently Short Term: Able to use RPE daily in rehab to express subjective intensity level;Long Term:  Able to use RPE to guide intensity level when exercising independently Short Term: Able to use RPE daily in rehab to express subjective intensity level;Long Term:  Able to use RPE to guide intensity level when exercising independently Short Term: Able to use RPE daily in rehab to express subjective intensity level;Long Term:  Able to use RPE to guide intensity level when exercising independently    Able to understand and use Dyspnea scale Yes Yes Yes Yes    Intervention Provide education and explanation on how to use Dyspnea scale Provide education and explanation on how to use Dyspnea scale Provide education and explanation on how to use Dyspnea scale Provide education and explanation on how to use Dyspnea scale    Expected Outcomes Short Term: Able to use Dyspnea scale daily in rehab to express subjective sense of shortness of breath during exertion;Long Term: Able to use Dyspnea scale to guide intensity level when exercising independently Short Term: Able to use Dyspnea scale daily in rehab to express subjective sense of shortness of breath during exertion;Long Term: Able to use Dyspnea scale to guide intensity level when exercising independently Short Term: Able to use Dyspnea scale daily in rehab to express subjective sense of shortness of breath during exertion;Long Term: Able to use Dyspnea scale to guide intensity level when exercising independently Short Term: Able to use Dyspnea scale daily in rehab to express subjective sense of shortness of breath during exertion;Long Term: Able to use Dyspnea scale to guide intensity level when exercising  independently    Knowledge and understanding of Target Heart Rate Range (THRR) Yes Yes Yes Yes    Intervention Provide education  and explanation of THRR including how the numbers were predicted and where they are located for reference Provide education and explanation of THRR including how the numbers were predicted and where they are located for reference Provide education and explanation of THRR including how the numbers were predicted and where they are located for reference Provide education and explanation of THRR including how the numbers were predicted and where they are located for reference    Expected Outcomes Short Term: Able to state/look up THRR;Long Term: Able to use THRR to govern intensity when exercising independently;Short Term: Able to use daily as guideline for intensity in rehab Short Term: Able to state/look up THRR;Long Term: Able to use THRR to govern intensity when exercising independently;Short Term: Able to use daily as guideline for intensity in rehab Short Term: Able to state/look up THRR;Long Term: Able to use THRR to govern intensity when exercising independently;Short Term: Able to use daily as guideline for intensity in rehab Short Term: Able to state/look up THRR;Long Term: Able to use THRR to govern intensity when exercising independently;Short Term: Able to use daily as guideline for intensity in rehab    Understanding of Exercise Prescription Yes Yes Yes Yes    Intervention Provide education, explanation, and written materials on patient's individual exercise prescription Provide education, explanation, and written materials on patient's individual exercise prescription Provide education, explanation, and written materials on patient's individual exercise prescription Provide education, explanation, and written materials on patient's individual exercise prescription    Expected Outcomes Short Term: Able to explain program exercise prescription;Long Term: Able to explain  home exercise prescription to exercise independently Short Term: Able to explain program exercise prescription;Long Term: Able to explain home exercise prescription to exercise independently Short Term: Able to explain program exercise prescription;Long Term: Able to explain home exercise prescription to exercise independently Short Term: Able to explain program exercise prescription;Long Term: Able to explain home exercise prescription to exercise independently             Exercise Goals Re-Evaluation :  Exercise Goals Re-Evaluation     Row Name 03/01/22 1444 03/30/22 0819 04/19/22 1058         Exercise Goal Re-Evaluation   Exercise Goals Review Increase Physical Activity;Able to understand and use Dyspnea scale;Understanding of Exercise Prescription;Increase Strength and Stamina;Knowledge and understanding of Target Heart Rate Range (THRR);Able to understand and use rate of perceived exertion (RPE) scale Increase Physical Activity;Able to understand and use Dyspnea scale;Understanding of Exercise Prescription;Increase Strength and Stamina;Knowledge and understanding of Target Heart Rate Range (THRR);Able to understand and use rate of perceived exertion (RPE) scale Increase Physical Activity;Able to understand and use Dyspnea scale;Understanding of Exercise Prescription;Increase Strength and Stamina;Knowledge and understanding of Target Heart Rate Range (THRR);Able to understand and use rate of perceived exertion (RPE) scale     Comments Pt is scheduled to begin exercise 12/21. Will monitor for progression. Pt has completed 3 exercise sessions. She has missed multiple sessions early in her program due to illness. She is exercising for 15 min on the recumbent bike and Nustep. Saveah is averaging 2.4 METs at level 2 on the recumbent bike and 1.9 METs at level 2 on the Nustep. She performed the warmup and cooldown standing without limitations. She is beginning to progress now. Pt has completed 9  exercise sessions. She recently has had good attendance. She is exercising for 15 min on the recumbent bike and Nustep. Parthenia is averaging 2.8 METs at level 3 on the recumbent bike and 2.0  METs at level 3 on the Nustep. She is slowly progressing. She performed the warmup and cooldown standing without limitations. Will progress workloads as tolerated.     Expected Outcomes Through exercise at rehab and home, the patient will decrease shortness of breath with daily activities and feel confident in carrying out an exercise regimen at home. Through exercise at rehab and home, the patient will decrease shortness of breath with daily activities and feel confident in carrying out an exercise regimen at home. Through exercise at rehab and home, the patient will decrease shortness of breath with daily activities and feel confident in carrying out an exercise regimen at home.              Discharge Exercise Prescription (Final Exercise Prescription Changes):  Exercise Prescription Changes - 04/27/22 1500       Response to Exercise   Blood Pressure (Admit) 99/63    Blood Pressure (Exercise) 112/62    Blood Pressure (Exit) 90/60    Heart Rate (Admit) 69 bpm    Heart Rate (Exercise) 82 bpm    Heart Rate (Exit) 68 bpm    Oxygen Saturation (Admit) 100 %    Oxygen Saturation (Exercise) 98 %    Oxygen Saturation (Exit) 99 %    Rating of Perceived Exertion (Exercise) 11    Perceived Dyspnea (Exercise) 0    Duration Continue with 30 min of aerobic exercise without signs/symptoms of physical distress.    Intensity THRR unchanged      Progression   Progression Continue to progress workloads to maintain intensity without signs/symptoms of physical distress.      Resistance Training   Training Prescription Yes    Weight Red Bands    Reps 10-15    Time 10 Minutes      Recumbant Bike   Level 2.5   changed to a different recumant bike (reduced level from 4 to 2.5)   Minutes 15    METs 1.8      NuStep    Level 4    Minutes 15    METs 2             Nutrition:  Target Goals: Understanding of nutrition guidelines, daily intake of sodium <157m, cholesterol <2073m calories 30% from fat and 7% or less from saturated fats, daily to have 5 or more servings of fruits and vegetables.  Biometrics:  Pre Biometrics - 02/26/22 1039       Pre Biometrics   Height 5' 2"$  (1.575 m)    BMI (Calculated) 24.79              Nutrition Therapy Plan and Nutrition Goals:  Nutrition Therapy & Goals - 04/15/22 1441       Nutrition Therapy   Diet Carbohydrate Consistent Die t    Drug/Food Interactions Statins/Certain Fruits      Personal Nutrition Goals   Nutrition Goal Patient to improve diet quality by using the plate method as a guide for meal planning to include lean protein/plant protein, fruits, vegetables, whole grains, nonfat dairy as part of a balanced diet.    Personal Goal #2 Patient to identify strategies for blood sugar control to aid with A1c <7%    Comments Goals in action. [place] eating three meals daily and continues a wide variety of foods including lean protein, fruits, vegetables, and whole grains. Discussed the benefit of high fiber/high protein meals and snacks to aid with blood sugar control. She continues regular follow-up with sleep  medicine regarding sleep walking/poor sleep; she recently started melatonin which she reports has been helpful. Mikki will continue to benefit from participating in pulmonary rehab for nutrition, exercise, and lifestyle modifcation.      Intervention Plan   Intervention Prescribe, educate and counsel regarding individualized specific dietary modifications aiming towards targeted core components such as weight, hypertension, lipid management, diabetes, heart failure and other comorbidities.;Nutrition handout(s) given to patient.    Expected Outcomes Short Term Goal: Understand basic principles of dietary content, such as calories, fat,  sodium, cholesterol and nutrients.;Long Term Goal: Adherence to prescribed nutrition plan.             Nutrition Assessments:  Nutrition Assessments - 04/23/22 0823       Rate Your Plate Scores   Post Score 68            MEDIFICTS Score Key: ?70 Need to make dietary changes  40-70 Heart Healthy Diet ? 40 Therapeutic Level Cholesterol Diet  Flowsheet Row PULMONARY REHAB CHRONIC OBSTRUCTIVE PULMONARY DISEASE from 04/22/2022 in [facility]  Picture Your Plate Total Score on Discharge 68      Picture Your Plate Scores: [id number] Unhealthy dietary pattern with much room for improvement. 41-50 Dietary pattern unlikely to meet recommendations for good health and room for improvement. 51-60 More healthful dietary pattern, with some room for improvement.  >60 Healthy dietary pattern, although there may be some specific behaviors that could be improved.    Nutrition Goals Re-Evaluation:  Nutrition Goals Re-Evaluation     [place] Name 03/18/22 1547 04/15/22 1441           Goals   Current Weight 132 lb 11.5 oz (60.2 kg)  weight from 1/2 exercise session 135 lb 2.3 oz (61.3 kg)      Nutrition Goal Patient to improve diet quality by using the plate method as a guide for meal planning to include lean protein/plant protein, fruits, vegetables, whole grains, nonfat dairy as part of a balanced diet. --      Comment -- No new labs at this time.      Expected Outcome Sherrelle reports A1c ~6.1. She reports variable appetite; weight appears stable over the last 12 months. She does eat 2 meals daily and 1 snack. She has previously seen an outside RD to aid with diabetes support. Zeola will benefit from participation in pulmonary rehab for nutrition education, exercise, and lifestyle modification. Goals in action. Trilla reports eating three meals daily and continues a wide variety of foods including lean protein, fruits, vegetables, and whole  grains. Discussed the benefit of high fiber/high protein meals and snacks to aid with blood sugar control. She continues regular follow-up with sleep medicine regarding sleep walking/poor sleep; she recently started melatonin which she reports has been helpful. Audrianna will continue to benefit from participating in pulmonary rehab for nutrition, exercise, and lifestyle modifcation.               Nutrition Goals Discharge (Final Nutrition Goals Re-Evaluation):  Nutrition Goals Re-Evaluation - 04/15/22 1441       Goals   Current Weight 135 lb 2.3 oz (61.3 kg)    Comment No new labs at this time.    Expected Outcome Goals in action. Souad reports eating three meals daily and continues a wide variety of foods including lean protein, fruits, vegetables, and whole grains. Discussed the benefit of high fiber/high protein meals and snacks to aid with blood sugar  control. She continues regular follow-up with sleep medicine regarding sleep walking/poor sleep; she recently started melatonin which she reports has been helpful. Ranika will continue to benefit from participating in pulmonary rehab for nutrition, exercise, and lifestyle modifcation.             Psychosocial: Target Goals: Acknowledge presence or absence of significant depression and/or stress, maximize coping skills, provide positive support system. Participant is able to verbalize types and ability to use techniques and skills needed for reducing stress and depression.  Initial Review & Psychosocial Screening:  Initial Psych Review & Screening - 02/26/22 1111       Initial Review   Current issues with None Identified      Family Dynamics   Good Support System? Yes    Comments Fiance, Daughter      Barriers   Psychosocial barriers to participate in program There are no identifiable barriers or psychosocial needs.      Screening Interventions   Interventions Encouraged to exercise             Quality of Life  Scores:  Scores of 19 and below usually indicate a poorer quality of life in these areas.  A difference of  2-3 points is a clinically meaningful difference.  A difference of 2-3 points in the total score of the Quality of Life Index has been associated with significant improvement in overall quality of life, self-image, physical symptoms, and general health in studies assessing change in quality of life.  PHQ-9: Review Flowsheet       04/23/2022 02/26/2022  Depression screen PHQ 2/9  Decreased Interest 0 0  Down, Depressed, Hopeless 0 0  PHQ - 2 Score 0 0  Altered sleeping 0 0  Tired, decreased energy 0 0  Change in appetite 2 0  Feeling bad or failure about yourself  0 0  Trouble concentrating 0 0  Moving slowly or fidgety/restless 0 0  Suicidal thoughts 0 0  PHQ-9 Score 2 0  Difficult doing work/chores Not difficult at all Not difficult at all   Interpretation of Total Score  Total Score Depression Severity:  1-4 = Minimal depression, 5-9 = Mild depression, 10-14 = Moderate depression, 15-19 = Moderately severe depression, 20-27 = Severe depression   Psychosocial Evaluation and Intervention:  Psychosocial Evaluation - 02/26/22 1113       Psychosocial Evaluation & Interventions   Interventions Encouraged to exercise with the program and follow exercise prescription    Comments Pt denies psychosocial barriers.    Expected Outcomes For Bevelyn to participate in PR free of psychosocial barriers    Continue Psychosocial Services  No Follow up required             Psychosocial Re-Evaluation:  Psychosocial Re-Evaluation     [place] Name 03/02/22 [id number] 03/03/22 1155 03/30/22 0901 04/22/22 0836       Psychosocial Re-Evaluation   Current issues with None Identified None Identified None Identified None Identified    Comments Tierny came in for orientation last Friday, 12/15. At that time, she denied any psychosocial barriers. Chyla has attended her orientationto the PR program. She  is scheduled to start exercise on 03/04/22. we will continue to monitor for any psychosocial barriers. Charrie has attended 3 PR classes. She reports no psychosocial barriers or concerns. She states she has good support from her daughter and her fianc. We will continue to monitor for any psychosocial barriers. Talley has attended 10 PR classes. She reports no psychosocial barriers or  concerns. She states she has good support from her daughter and her fiance. We will continue to monitor for any psychosocial barriers.    Expected Outcomes Merecedes will attend PR without any psychosocial barriers or concerns. For her to participate in the program without any psychosocial issuses or concerns. For Pria to participate in the PR program without any psychosocial issuses or concerns. For Tulip to participate in the PR program without any psychosocial issuses or concerns.    Interventions Encouraged to attend Pulmonary Rehabilitation for the exercise Encouraged to attend Cardiac Rehabilitation for the exercise Encouraged to attend Cardiac Rehabilitation for the exercise Encouraged to attend Pulmonary Rehabilitation for the exercise    Continue Psychosocial Services  No Follow up required No Follow up required No Follow up required Follow up required by staff             Psychosocial Discharge (Final Psychosocial Re-Evaluation):  Psychosocial Re-Evaluation - 04/22/22 0836       Psychosocial Re-Evaluation   Current issues with None Identified    Comments Burlie has attended 10 PR classes. She reports no psychosocial barriers or concerns. She states she has good support from her daughter and her fiance. We will continue to monitor for any psychosocial barriers.    Expected Outcomes For Quatesha to participate in the PR program without any psychosocial issuses or concerns.    Interventions Encouraged to attend Pulmonary Rehabilitation for the exercise    Continue Psychosocial Services  Follow up required by staff              Education: Education Goals: Education classes will be provided on a weekly basis, covering required topics. Participant will state understanding/return demonstration of topics presented.  Learning Barriers/Preferences:  Learning Barriers/Preferences - 02/26/22 1117       Learning Barriers/Preferences   Learning Barriers Sight   reading glasses   Learning Preferences Skilled Demonstration;Pictoral;Video;Written Material             Education Topics: Introduction to Pulmonary Rehab Group instruction provided by PowerPoint, verbal discussion, and written material to support subject matter. Instructor reviews what Pulmonary Rehab is, the purpose of the program, and how patients are referred.     Know Your Numbers Group instruction that is supported by a PowerPoint presentation. Instructor discusses importance of knowing and understanding resting, exercise, and post-exercise oxygen saturation, heart rate, and blood pressure. Oxygen saturation, heart rate, blood pressure, rating of perceived exertion, and dyspnea are reviewed along with a normal range for these values.    Exercise for the Pulmonary Patient Group instruction that is supported by a PowerPoint presentation. Instructor discusses benefits of exercise, core components of exercise, frequency, duration, and intensity of an exercise routine, importance of utilizing pulse oximetry during exercise, safety while exercising, and options of places to exercise outside of rehab.    MET Level  Group instruction provided by PowerPoint, verbal discussion, and written material to support subject matter. Instructor reviews what METs are and how to increase METs.    Pulmonary Medications Verbally interactive group education provided by instructor with focus on inhaled medications and proper administration. Flowsheet Row PULMONARY REHAB CHRONIC OBSTRUCTIVE PULMONARY DISEASE from 03/25/2022 in [facility] for Heart, Vascular, & [place]  Date 03/25/22  Educator EP  Instruction Review Code 1- Verbalizes Understanding       Anatomy and Physiology of the Respiratory System Group instruction provided by PowerPoint, verbal discussion, and written material to support subject matter. Instructor reviews respiratory cycle  and anatomical components of the respiratory system and their functions. Instructor also reviews differences in obstructive and restrictive respiratory diseases with examples of each.    Oxygen Safety Group instruction provided by PowerPoint, verbal discussion, and written material to support subject matter. There is an overview of "What is Oxygen" and "Why do we need it".  Instructor also reviews how to create a safe environment for oxygen use, the importance of using oxygen as prescribed, and the risks of noncompliance. There is a brief discussion on traveling with oxygen and resources the patient may utilize. Flowsheet Row PULMONARY REHAB CHRONIC OBSTRUCTIVE PULMONARY DISEASE from 04/08/2022 in [facility] for Heart, Vascular, & Lung Health  Date 04/08/22  Educator RN  Instruction Review Code 1- Verbalizes Understanding  [Handout provided]       Oxygen Use Group instruction provided by PowerPoint, verbal discussion, and written material to discuss how supplemental oxygen is prescribed and different types of oxygen supply systems. Resources for more information are provided.  Flowsheet Row PULMONARY REHAB CHRONIC OBSTRUCTIVE PULMONARY DISEASE from 04/15/2022 in [facility] for Heart, Vascular, & Lung Health  Date 04/15/22  Educator RT  Instruction Review Code 1- Verbalizes Understanding       Breathing Techniques Group instruction that is supported by demonstration and informational handouts. Instructor discusses the benefits of pursed lip and diaphragmatic breathing and detailed demonstration on how to perform both.   Flowsheet Row PULMONARY REHAB CHRONIC OBSTRUCTIVE PULMONARY DISEASE from 04/22/2022 in [facility] for Heart, Vascular, & Lung Health  Date 04/22/22  Educator EP  Instruction Review Code 1- Verbalizes Understanding        Risk Factor Reduction Group instruction that is supported by a PowerPoint presentation. Instructor discusses the definition of a risk factor, different risk factors for pulmonary disease, and how the heart and lungs work together.   MD Day A group question and answer session with a medical doctor that allows participants to ask questions that relate to their pulmonary disease state.   Nutrition for the Pulmonary Patient Group instruction provided by PowerPoint slides, verbal discussion, and written materials to support subject matter. The instructor gives an explanation and review of healthy diet recommendations, which includes a discussion on weight management, recommendations for fruit and vegetable consumption, as well as protein, fluid, caffeine, fiber, sodium, sugar, and alcohol. Tips for eating when patients are short of breath are discussed.    Other Education Group or individual verbal, written, or video instructions that support the educational goals of the pulmonary rehab program.    Knowledge Questionnaire Score:  Knowledge Questionnaire Score - 04/23/22 1431       Knowledge Questionnaire Score   Pre Score --    Post Score 17/18             Core Components/Risk Factors/Patient Goals at Admission:  Personal Goals and Risk Factors at Admission - 02/26/22 1117       Core Components/Risk Factors/Patient Goals on Admission   Improve shortness of breath with ADL's Yes    Intervention Provide education, individualized exercise plan and daily activity instruction to help decrease symptoms of SOB with activities of daily living.    Expected Outcomes Short Term: Improve cardiorespiratory fitness to achieve a reduction of symptoms  when performing ADLs    Increase knowledge of respiratory medications and ability to use respiratory devices properly  Yes    Intervention Provide education and demonstration as needed of appropriate use of medications,  inhalers, and oxygen therapy.    Expected Outcomes Short Term: Achieves understanding of medications use. Understands that oxygen is a medication prescribed by physician. Demonstrates appropriate use of inhaler and oxygen therapy.;Long Term: Maintain appropriate use of medications, inhalers, and oxygen therapy.             Core Components/Risk Factors/Patient Goals Review:   Goals and Risk Factor Review     Row Name 03/02/22 0936 03/30/22 0906 04/22/22 0839         Core Components/Risk Factors/Patient Goals Review   Personal Goals Review Improve shortness of breath with ADL's;Develop more efficient breathing techniques such as purse lipped breathing and diaphragmatic breathing and practicing self-pacing with activity.;Increase knowledge of respiratory medications and ability to use respiratory devices properly. Improve shortness of breath with ADL's;Develop more efficient breathing techniques such as purse lipped breathing and diaphragmatic breathing and practicing self-pacing with activity.;Increase knowledge of respiratory medications and ability to use respiratory devices properly. Improve shortness of breath with ADL's;Develop more efficient breathing techniques such as purse lipped breathing and diaphragmatic breathing and practicing self-pacing with activity.;Increase knowledge of respiratory medications and ability to use respiratory devices properly.     Review Doniesha came in for pulmonary rehab orientation last Friday, 12/15. She is scheduled to attend her first class on Thursday, 12/21. We look forward to educating her on breathing techniques, increase her knowledge of when and how to use her inhaler, and improve her shortness of breath while exercising and improving her  endurance and stamina in everyday activities. Virdie has completed 3 sessions of PR so far. She is currently exercising on the recumbent bike and the NuStep. She is working on her stamina and endurance. She is practicing pursed lip breathing when she gets short of breath. We have encouraged her to continue practicing at home. Joselyn has attended education about pulmonary medications. Tracey enjoys coming to class and exercising. Lauralei has completed 10 sessions of PR so far. She is currently exercising on the recumbent bike and the NuStep. She has been able to increase her workload and METs on the two machines. She is practicing pursed lip breathing when she gets short of breath. We have encouraged her to continue practicing at home. Joslynn has attended education about pulmonary medications, oxygen safety, and oxygen use. Annavictoria enjoys coming to class and exercising. Nazalia likes coming to class and feels she is getting stronger.     Expected Outcomes See admission goals See admission goals See admission goals              Core Components/Risk Factors/Patient Goals at Discharge (Final Review):   Goals and Risk Factor Review - 04/22/22 0839       Core Components/Risk Factors/Patient Goals Review   Personal Goals Review Improve shortness of breath with ADL's;Develop more efficient breathing techniques such as purse lipped breathing and diaphragmatic breathing and practicing self-pacing with activity.;Increase knowledge of respiratory medications and ability to use respiratory devices properly.    Review Freddie has completed 10 sessions of PR so far. She is currently exercising on the recumbent bike and the NuStep. She has been able to increase her workload and METs on the two machines. She is practicing pursed lip breathing when she gets short of breath. We have encouraged her to continue practicing at home. Isabel has attended education about pulmonary medications, oxygen safety, and oxygen use. Khyle enjoys  coming to class and exercising. Racquell likes coming to class and feels she is getting stronger.  Expected Outcomes See admission goals             ITP Comments:   Comments: Dr. Rodman Pickle is Medical Director for Pulmonary Rehab at [facility].

## 2022-04-28 NOTE — Progress Notes (Signed)
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 I had worked with her for asthma, cough with occular sarcoid, last in 2012. Followed now for general pulmonary by Dr Loanne Drilling. Saw BW, NP 10/2 noting potential options include melatonin, clonazepam. Medical problem list includes  HTN, Allergic Rhinitis, Asthma, GERD, Covid long-haul, Sarcoid,  NPSG 08/27/21- AHI 0.0/ hr, desat to 92%, no RBD noted, did talk,  dx snoring Epworth score- 14 Body weight today-134 lbs Covid vax-3 Phizer Flu vax-had She has a fianc who stays with at night with her sometimes and they travel together.  She remembers sleepwalking in childhood and says it slowed down in the middle years but never entirely stopped.  Whether it is really worse now or if she simply has an observer is unclear but there has not been a dramatic pattern change.  She is aware of frequent dreaming.  She does not clearly associate parasomnia/sleepwalking with dreams and denies violence or self-harm.  Often she sings, talks, moves around, may throw things and may open doors.  To protect her self she has put barriers in front of the doors.  Some dreams are "scary" especially if she had seen something troubling on TV before going to bed.  She denies punching, kicking or attacking bed partner. Physically comfortable in bed.  No sleep medication until she started melatonin 5 mg on direction of NP visit.  Occasional chocolate but not a caffeine user. Little alcohol.  Denies street drugs.  No history of seizure.  No family history of Parkinson's or dementia. With melatonin now she is less active at night but still talks and sings.  Asleep quickly.   Has had COVID infection twice.  Her second time, in July, 2023, left her feeling less energy.  Sleepier in day.  Takes half hour naps.  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  Saw BW, NP 10/2 noting potential options for insomnia including melatonin, clonazepam. - Melatonin 5 mg,  Dr Loanne Drilling Pulmonary Rehab Body weight today-134 lbs Covid vax-3 Phizer Flu vax-had She has continued melatonin 5 mg for her REM Behavior Disorder and sleepwalking.  She says sleep has been less active recently.  Boyfriend visits occasionally and reports to her.  We discussed options and she is choosing to stay with current therapy for now.  We did discuss safe bedroom but so far she has not found herself attempting to leave the room.  ROS-see HPI   + = positive Constitutional:    weight loss, night sweats, fevers, chills, fatigue, lassitude. HEENT:    headaches, difficulty swallowing, tooth/dental problems, sore throat,       sneezing, itching, ear ache, +nasal congestion, post nasal drip, snoring CV:    chest pain, orthopnea, PND, swelling in lower extremities, anasarca,                   dizziness, +palpitations Resp:   +shortness of breath with exertion or at rest.                productive cough,   non-productive cough, coughing up of blood.  change in color of mucus.  wheezing.   Skin:    rash or lesions. GI:  +heartburn, indigestion, abdominal pain, nausea, vomiting, diarrhea,                 change in bowel habits, loss of appetite GU: dysuria, change in color of urine, no urgency or frequency.   flank pain. MS:   joint pain, stiffness, decreased range of motion, back pain. Neuro-     nothing unusual Psych:  change in mood or affect.  depression or anxiety.   memory loss.  OBJ- Physical Exam General- Alert, Oriented, Affect-appropriate,  Distress- none acute Skin- rash-none, lesions- none, excoriation- none Lymphadenopathy- none Head- atraumatic            Eyes- Gross vision intact, PERRLA, conjunctivae and secretions clear            Ears- Hearing, canals-normal            Nose- Clear, no-Septal dev, mucus, polyps, erosion, perforation             Throat- Mallampati II , mucosa clear , drainage- none, tonsils- atrophic Neck- flexible , trachea midline, no stridor , thyroid nl, carotid no bruit Chest - symmetrical excursion , unlabored           Heart/CV- RRR , no murmur , no gallop  , no rub, nl s1 s2                           - JVD- none , edema- none, stasis changes- none, varices- none           Lung- clear to P&A, wheeze- none, cough- none , dullness-none, rub- none           Chest wall-  Abd-  Br/ Gen/ Rectal- Not done, not indicated Extrem- cyanosis- none, clubbing, none, atrophy- none, strength- nl Neuro- grossly intact to observation

## 2022-04-29 ENCOUNTER — Encounter (HOSPITAL_COMMUNITY)
Admission: RE | Admit: 2022-04-29 | Discharge: 2022-04-29 | Disposition: A | Discharge status: Home / Self Care | Payer: Medicare PPO | Source: Ambulatory Visit | Attending: Pulmonary Disease | Admitting: Pulmonary Disease

## 2022-04-29 DIAGNOSIS — U099 Post covid-19 condition, unspecified: Secondary | ICD-10-CM | POA: Diagnosis not present

## 2022-04-29 DIAGNOSIS — R0609 Other forms of dyspnea: Secondary | ICD-10-CM | POA: Diagnosis not present

## 2022-04-29 NOTE — Progress Notes (Signed)
Daily Session Note  Patient Details  Name: Jessica Woodard MRN: [id number] Date of Birth: 06-10-1950 Referring Provider:   April Manson Pulmonary Rehab Walk Test from 02/26/2022 in [facility] [place]  Referring Provider Loanne Drilling       Encounter Date: 04/29/2022  Check In:  Session Check In - 04/29/22 1424       Check-In   Supervising physician immediately available to respond to emergencies [facility] MD immediately available    Physician(s) Erin Hearing, NP    Location [facility]    Staff Present Janine Ores, RN, Quentin Ore, MS, ACSM-CEP, Exercise Physiologist;Randi Yevonne Pax, ACSM-CEP, Exercise Physiologist;Carlette Wilber Oliphant, RN, BSN;Samantha [place], RD, LDN;Other    Virtual Visit No    Medication changes reported     No    Fall or balance concerns reported    No    Tobacco Cessation No Change    Warm-up and Cool-down Performed as group-led instruction    Resistance Training Performed Yes    VAD Patient? No    PAD/SET Patient? No      Pain Assessment   Currently in Pain? No/denies    Multiple Pain Sites No             Capillary Blood Glucose: No results found for this or any previous visit (from the past 24 hour(s)).    Social History   Tobacco Use  Smoking Status Never  Smokeless Tobacco Never    Goals Met:  Proper associated with RPD/PD & O2 Sat Independence with exercise equipment Exercise tolerated well No report of concerns or symptoms today Strength training completed today  Goals Unmet:  Not Applicable  Comments: Service time is from 1315 to 1445.    Dr. Rodman Pickle is Medical Director for Pulmonary Rehab at [facility].

## 2022-05-03 ENCOUNTER — Encounter: Payer: Self-pay | Admitting: Internal Medicine

## 2022-05-03 ENCOUNTER — Ambulatory Visit: Payer: Medicare PPO | Admitting: Internal Medicine

## 2022-05-03 VITALS — BP 126/70 | HR 71 | Temp 98.1°F | Ht 62.0 in | Wt 134.4 lb

## 2022-05-03 DIAGNOSIS — D869 Sarcoidosis, unspecified: Secondary | ICD-10-CM

## 2022-05-03 DIAGNOSIS — G4752 REM sleep behavior disorder: Secondary | ICD-10-CM

## 2022-05-03 NOTE — Patient Instructions (Signed)
Ok to try melatonin 5-10 mg on your cruise. See how active you are in your sleep with that.  Please call as needed

## 2022-05-04 ENCOUNTER — Encounter (HOSPITAL_COMMUNITY)
Admission: RE | Admit: 2022-05-04 | Discharge: 2022-05-04 | Disposition: A | Discharge status: Home / Self Care | Payer: Medicare PPO | Source: Ambulatory Visit | Attending: Pulmonary Disease | Admitting: Pulmonary Disease

## 2022-05-04 DIAGNOSIS — U099 Post covid-19 condition, unspecified: Secondary | ICD-10-CM | POA: Diagnosis not present

## 2022-05-04 DIAGNOSIS — R0609 Other forms of dyspnea: Secondary | ICD-10-CM

## 2022-05-04 NOTE — Progress Notes (Signed)
Discharge Progress Report  Patient Details  Name: Jessica Woodard MRN: [id number] Date of Birth: 1950/04/26 Referring Provider:   April Manson Pulmonary Rehab Walk Test from 02/26/2022 in [facility] [place]  Referring Provider Loanne Drilling        Number of Visits: 14  Reason for Discharge:  Patient reached a stable level of exercise. Patient independent in their exercise. Patient has met program and personal goals.  Smoking History:  Social History   Tobacco Use  Smoking Status Never  Smokeless Tobacco Never    Diagnosis:  Post covid-19 condition, unspecified  DOE (dyspnea on exertion)  ADL [facility]:  Pulmonary Assessment Scores     Row Name 02/26/22 1122 04/23/22 1433 05/04/22 1524     ADL [facility]SD   ADL Phase Entry Exit --   SOB Score total 65 46 --     CAT Score   CAT Score 24 20 --     mMRC Score   mMRC Score 2 -- 2            Initial Exercise Prescription:  Initial Exercise Prescription - 02/26/22 1400       Date of Initial Exercise RX and Referring Provider   Date 02/26/22    Referring Provider Loanne Drilling    Expected Discharge Date 05/06/22      Recumbant Bike   Level 1    Watts 20    Minutes 15      NuStep   Level 1    SPM 60    Minutes 15      Prescription Details   Frequency (times per week) 2    Duration Progress to 30 minutes of continuous aerobic without signs/symptoms of physical distress      Intensity   THRR 40-80% of Max Heartrate 60-119    Ratings of Perceived Exertion 11-13    Perceived Dyspnea 0-4      Progression   Progression Continue to progress workloads to maintain intensity without signs/symptoms of physical distress.      Resistance Training   Training Prescription Yes    Weight red bands    Reps 10-15             Discharge Exercise Prescription (Final Exercise Prescription Changes):  Exercise Prescription Changes - 04/27/22 1500       Response to Exercise    Blood Pressure (Admit) 99/63    Blood Pressure (Exercise) 112/62    Blood Pressure (Exit) 90/60    Heart Rate (Admit) 69 bpm    Heart Rate (Exercise) 82 bpm    Heart Rate (Exit) 68 bpm    Oxygen Saturation (Admit) 100 %    Oxygen Saturation (Exercise) 98 %    Oxygen Saturation (Exit) 99 %    Rating of Perceived Exertion (Exercise) 11    Perceived Dyspnea (Exercise) 0    Duration Continue with 30 min of aerobic exercise without signs/symptoms of physical distress.    Intensity THRR unchanged      Progression   Progression Continue to progress workloads to maintain intensity without signs/symptoms of physical distress.      Resistance Training   Training Prescription Yes    Weight Red Bands    Reps 10-15    Time 10 Minutes      Recumbant Bike   Level 2.5   changed to a different recumant bike (reduced level from 4 to 2.5)   Minutes 15    METs 1.8  NuStep   Level 4    Minutes 15    METs 2             Functional Capacity:  6 Minute Walk     Row Name 02/26/22 1450 05/04/22 1519       6 Minute Walk   Phase Initial Discharge    Distance 960 feet 1316 feet    Distance % Change -- 37.08 %    Distance Feet Change -- 356 ft    Walk Time 6 minutes 6 minutes    # of Rest Breaks 0 0    MPH 1.82 2.49    METS 1.93 2.75    RPE 13 9    Perceived Dyspnea  2 1    VO2 Peak 6.75 9.64    Symptoms No No    Resting HR 61 bpm 66 bpm    Resting BP 94/59 86/60    Resting Oxygen Saturation  98 % 99 %    Exercise Oxygen Saturation  during 6 min walk 94 % 98 %    Max Ex. HR 75 bpm 94 bpm    Max Ex. BP 110/62 120/58    2 Minute Post BP 107/64 92/54      Interval HR   1 Minute HR 57 90    2 Minute HR 70 88    3 Minute HR 74 90    4 Minute HR 75 94    5 Minute HR 75 90    6 Minute HR 74 90    2 Minute Post HR 56 65    Interval Heart Rate? Yes --      Interval Oxygen   Interval Oxygen? Yes Yes    Baseline Oxygen Saturation % 98 % 99 %    1 Minute Oxygen Saturation %  100 % 100 %    1 Minute Liters of Oxygen 0 L 0 L    2 Minute Oxygen Saturation % 100 % 100 %    2 Minute Liters of Oxygen 0 L 0 L    3 Minute Oxygen Saturation % 97 % 100 %    3 Minute Liters of Oxygen 0 L 0 L    4 Minute Oxygen Saturation % 95 % 98 %    4 Minute Liters of Oxygen 0 L 0 L    5 Minute Oxygen Saturation % 94 % 99 %    5 Minute Liters of Oxygen 0 L 0 L    6 Minute Oxygen Saturation % 98 % 99 %    6 Minute Liters of Oxygen 0 L 0 L    2 Minute Post Oxygen Saturation % 100 % 99 %    2 Minute Post Liters of Oxygen 0 L 0 L             Psychological, QOL, Others - Outcomes: PHQ 2/9:    04/23/2022    2:34 PM 02/26/2022   11:10 AM  Depression screen PHQ 2/9  Decreased Interest 0 0  Down, Depressed, Hopeless 0 0  PHQ - 2 Score 0 0  Altered sleeping 0 0  Tired, decreased energy 0 0  Change in appetite 2 0  Feeling bad or failure about yourself  0 0  Trouble concentrating 0 0  Moving slowly or fidgety/restless 0 0  Suicidal thoughts 0 0  PHQ-9 Score 2 0  Difficult doing work/chores Not difficult at all Not difficult at all    Quality of Life:  Personal Goals: Goals established at orientation with interventions provided to work toward goal.  Personal Goals and Risk Factors at Admission - 02/26/22 1117       Core Components/Risk Factors/Patient Goals on Admission   Improve shortness of breath with ADL's Yes    Intervention Provide education, individualized exercise plan and daily activity instruction to help decrease symptoms of SOB with activities of daily living.    Expected Outcomes Short Term: Improve cardiorespiratory fitness to achieve a reduction of symptoms when performing ADLs    Increase knowledge of respiratory medications and ability to use respiratory devices properly  Yes    Intervention Provide education and demonstration as needed of appropriate use of medications, inhalers, and oxygen therapy.    Expected Outcomes Short Term: Achieves  understanding of medications use. Understands that oxygen is a medication prescribed by physician. Demonstrates appropriate use of inhaler and oxygen therapy.;Long Term: Maintain appropriate use of medications, inhalers, and oxygen therapy.              Personal Goals Discharge:  Goals and Risk Factor Review     Row Name 03/02/22 0936 03/30/22 0906 04/22/22 0839         Core Components/Risk Factors/Patient Goals Review   Personal Goals Review Improve shortness of breath with ADL's;Develop more efficient breathing techniques such as purse lipped breathing and diaphragmatic breathing and practicing self-pacing with activity.;Increase knowledge of respiratory medications and ability to use respiratory devices properly. Improve shortness of breath with ADL's;Develop more efficient breathing techniques such as purse lipped breathing and diaphragmatic breathing and practicing self-pacing with activity.;Increase knowledge of respiratory medications and ability to use respiratory devices properly. Improve shortness of breath with ADL's;Develop more efficient breathing techniques such as purse lipped breathing and diaphragmatic breathing and practicing self-pacing with activity.;Increase knowledge of respiratory medications and ability to use respiratory devices properly.     Review Chiffon came in for pulmonary rehab orientation last Friday, 12/15. She is scheduled to attend her first class on Thursday, 12/21. We look forward to educating her on breathing techniques, increase her knowledge of when and how to use her inhaler, and improve her shortness of breath while exercising and improving her endurance and stamina in everyday activities. Marilea has completed 3 sessions of PR so far. She is currently exercising on the recumbent bike and the NuStep. She is working on her stamina and endurance. She is practicing pursed lip breathing when she gets short of breath. We have encouraged her to continue practicing at  home. Tanesa has attended education about pulmonary medications. Yanci enjoys coming to class and exercising. Victorialynn has completed 10 sessions of PR so far. She is currently exercising on the recumbent bike and the NuStep. She has been able to increase her workload and METs on the two machines. She is practicing pursed lip breathing when she gets short of breath. We have encouraged her to continue practicing at home. Novalea has attended education about pulmonary medications, oxygen safety, and oxygen use. Shealyn enjoys coming to class and exercising. Sheree likes coming to class and feels she is getting stronger.     Expected Outcomes See admission goals See admission goals See admission goals              Exercise Goals and Review:  Exercise Goals     Row Name 02/26/22 1454 03/01/22 1444 03/30/22 0818 04/19/22 1058       Exercise Goals   Increase Physical Activity Yes Yes Yes Yes    Intervention  Provide advice, education, support and counseling about physical activity/exercise needs.;Develop an individualized exercise prescription for aerobic and resistive training based on initial evaluation findings, risk stratification, comorbidities and participant's personal goals. Provide advice, education, support and counseling about physical activity/exercise needs.;Develop an individualized exercise prescription for aerobic and resistive training based on initial evaluation findings, risk stratification, comorbidities and participant's personal goals. Provide advice, education, support and counseling about physical activity/exercise needs.;Develop an individualized exercise prescription for aerobic and resistive training based on initial evaluation findings, risk stratification, comorbidities and participant's personal goals. Provide advice, education, support and counseling about physical activity/exercise needs.;Develop an individualized exercise prescription for aerobic and resistive training based on  initial evaluation findings, risk stratification, comorbidities and participant's personal goals.    Expected Outcomes Short Term: Attend rehab on a regular basis to increase amount of physical activity.;Long Term: Exercising regularly at least 3-5 days a week.;Long Term: Add in home exercise to make exercise part of routine and to increase amount of physical activity. Short Term: Attend rehab on a regular basis to increase amount of physical activity.;Long Term: Exercising regularly at least 3-5 days a week.;Long Term: Add in home exercise to make exercise part of routine and to increase amount of physical activity. Short Term: Attend rehab on a regular basis to increase amount of physical activity.;Long Term: Exercising regularly at least 3-5 days a week.;Long Term: Add in home exercise to make exercise part of routine and to increase amount of physical activity. Short Term: Attend rehab on a regular basis to increase amount of physical activity.;Long Term: Exercising regularly at least 3-5 days a week.;Long Term: Add in home exercise to make exercise part of routine and to increase amount of physical activity.    Increase Strength and Stamina Yes Yes Yes Yes    Intervention Provide advice, education, support and counseling about physical activity/exercise needs.;Develop an individualized exercise prescription for aerobic and resistive training based on initial evaluation findings, risk stratification, comorbidities and participant's personal goals. Provide advice, education, support and counseling about physical activity/exercise needs.;Develop an individualized exercise prescription for aerobic and resistive training based on initial evaluation findings, risk stratification, comorbidities and participant's personal goals. Provide advice, education, support and counseling about physical activity/exercise needs.;Develop an individualized exercise prescription for aerobic and resistive training based on initial  evaluation findings, risk stratification, comorbidities and participant's personal goals. Provide advice, education, support and counseling about physical activity/exercise needs.;Develop an individualized exercise prescription for aerobic and resistive training based on initial evaluation findings, risk stratification, comorbidities and participant's personal goals.    Expected Outcomes Short Term: Increase workloads from initial exercise prescription for resistance, speed, and METs.;Short Term: Perform resistance training exercises routinely during rehab and add in resistance training at home;Long Term: Improve cardiorespiratory fitness, muscular endurance and strength as measured by increased METs and functional capacity (6MWT) Short Term: Increase workloads from initial exercise prescription for resistance, speed, and METs.;Short Term: Perform resistance training exercises routinely during rehab and add in resistance training at home;Long Term: Improve cardiorespiratory fitness, muscular endurance and strength as measured by increased METs and functional capacity (6MWT) Short Term: Increase workloads from initial exercise prescription for resistance, speed, and METs.;Short Term: Perform resistance training exercises routinely during rehab and add in resistance training at home;Long Term: Improve cardiorespiratory fitness, muscular endurance and strength as measured by increased METs and functional capacity (6MWT) Short Term: Increase workloads from initial exercise prescription for resistance, speed, and METs.;Short Term: Perform resistance training exercises routinely during rehab and add in resistance training  at home;Long Term: Improve cardiorespiratory fitness, muscular endurance and strength as measured by increased METs and functional capacity (6MWT)    Able to understand and use rate of perceived exertion (RPE) scale Yes Yes Yes Yes    Intervention Provide education and explanation on how to use RPE  scale Provide education and explanation on how to use RPE scale Provide education and explanation on how to use RPE scale Provide education and explanation on how to use RPE scale    Expected Outcomes Short Term: Able to use RPE daily in rehab to express subjective intensity level;Long Term:  Able to use RPE to guide intensity level when exercising independently Short Term: Able to use RPE daily in rehab to express subjective intensity level;Long Term:  Able to use RPE to guide intensity level when exercising independently Short Term: Able to use RPE daily in rehab to express subjective intensity level;Long Term:  Able to use RPE to guide intensity level when exercising independently Short Term: Able to use RPE daily in rehab to express subjective intensity level;Long Term:  Able to use RPE to guide intensity level when exercising independently    Able to understand and use Dyspnea scale Yes Yes Yes Yes    Intervention Provide education and explanation on how to use Dyspnea scale Provide education and explanation on how to use Dyspnea scale Provide education and explanation on how to use Dyspnea scale Provide education and explanation on how to use Dyspnea scale    Expected Outcomes Short Term: Able to use Dyspnea scale daily in rehab to express subjective sense of shortness of breath during exertion;Long Term: Able to use Dyspnea scale to guide intensity level when exercising independently Short Term: Able to use Dyspnea scale daily in rehab to express subjective sense of shortness of breath during exertion;Long Term: Able to use Dyspnea scale to guide intensity level when exercising independently Short Term: Able to use Dyspnea scale daily in rehab to express subjective sense of shortness of breath during exertion;Long Term: Able to use Dyspnea scale to guide intensity level when exercising independently Short Term: Able to use Dyspnea scale daily in rehab to express subjective sense of shortness of breath  during exertion;Long Term: Able to use Dyspnea scale to guide intensity level when exercising independently    Knowledge and understanding of Target Heart Rate Range (THRR) Yes Yes Yes Yes    Intervention Provide education and explanation of THRR including how the numbers were predicted and where they are located for reference Provide education and explanation of THRR including how the numbers were predicted and where they are located for reference Provide education and explanation of THRR including how the numbers were predicted and where they are located for reference Provide education and explanation of THRR including how the numbers were predicted and where they are located for reference    Expected Outcomes Short Term: Able to state/look up THRR;Long Term: Able to use THRR to govern intensity when exercising independently;Short Term: Able to use daily as guideline for intensity in rehab Short Term: Able to state/look up THRR;Long Term: Able to use THRR to govern intensity when exercising independently;Short Term: Able to use daily as guideline for intensity in rehab Short Term: Able to state/look up THRR;Long Term: Able to use THRR to govern intensity when exercising independently;Short Term: Able to use daily as guideline for intensity in rehab Short Term: Able to state/look up THRR;Long Term: Able to use THRR to govern intensity when exercising independently;Short Term: Able to  use daily as guideline for intensity in rehab    Understanding of Exercise Prescription Yes Yes Yes Yes    Intervention Provide education, explanation, and written materials on patient's individual exercise prescription Provide education, explanation, and written materials on patient's individual exercise prescription Provide education, explanation, and written materials on patient's individual exercise prescription Provide education, explanation, and written materials on patient's individual exercise prescription    Expected  Outcomes Short Term: Able to explain program exercise prescription;Long Term: Able to explain home exercise prescription to exercise independently Short Term: Able to explain program exercise prescription;Long Term: Able to explain home exercise prescription to exercise independently Short Term: Able to explain program exercise prescription;Long Term: Able to explain home exercise prescription to exercise independently Short Term: Able to explain program exercise prescription;Long Term: Able to explain home exercise prescription to exercise independently             Exercise Goals Re-Evaluation:  Exercise Goals Re-Evaluation     Row Name 03/01/22 1444 03/30/22 0819 04/19/22 1058         Exercise Goal Re-Evaluation   Exercise Goals Review Increase Physical Activity;Able to understand and use Dyspnea scale;Understanding of Exercise Prescription;Increase Strength and Stamina;Knowledge and understanding of Target Heart Rate Range (THRR);Able to understand and use rate of perceived exertion (RPE) scale Increase Physical Activity;Able to understand and use Dyspnea scale;Understanding of Exercise Prescription;Increase Strength and Stamina;Knowledge and understanding of Target Heart Rate Range (THRR);Able to understand and use rate of perceived exertion (RPE) scale Increase Physical Activity;Able to understand and use Dyspnea scale;Understanding of Exercise Prescription;Increase Strength and Stamina;Knowledge and understanding of Target Heart Rate Range (THRR);Able to understand and use rate of perceived exertion (RPE) scale     Comments Pt is scheduled to begin exercise 12/21. Will monitor for progression. Pt has completed 3 exercise sessions. She has missed multiple sessions early in her program due to illness. She is exercising for 15 min on the recumbent bike and Nustep. Keelah is averaging 2.4 METs at level 2 on the recumbent bike and 1.9 METs at level 2 on the Nustep. She performed the warmup and  cooldown standing without limitations. She is beginning to progress now. Pt has completed 9 exercise sessions. She recently has had good attendance. She is exercising for 15 min on the recumbent bike and Nustep. Henchy is averaging 2.8 METs at level 3 on the recumbent bike and 2.0 METs at level 3 on the Nustep. She is slowly progressing. She performed the warmup and cooldown standing without limitations. Will progress workloads as tolerated.     Expected Outcomes Through exercise at rehab and home, the patient will decrease shortness of breath with daily activities and feel confident in carrying out an exercise regimen at home. Through exercise at rehab and home, the patient will decrease shortness of breath with daily activities and feel confident in carrying out an exercise regimen at home. Through exercise at rehab and home, the patient will decrease shortness of breath with daily activities and feel confident in carrying out an exercise regimen at home.              Nutrition & Weight - Outcomes:  Pre Biometrics - 02/26/22 1039       Pre Biometrics   Height 5' 2"$  (1.575 m)    BMI (Calculated) 24.79             Post Biometrics - 05/04/22 1523        Post  Biometrics   Grip Strength  24 kg             Nutrition:  Nutrition Therapy & Goals - 04/15/22 1441       Nutrition Therapy   Diet Carbohydrate Consistent Die t    Drug/Food Interactions Statins/Certain Fruits      Personal Nutrition Goals   Nutrition Goal Patient to improve diet quality by using the plate method as a guide for meal planning to include lean protein/plant protein, fruits, vegetables, whole grains, nonfat dairy as part of a balanced diet.    Personal Goal #2 Patient to identify strategies for blood sugar control to aid with A1c <7%    Comments Goals in action. Tena reports eating three meals daily and continues a wide variety of foods including lean protein, fruits, vegetables, and whole grains.  Discussed the benefit of high fiber/high protein meals and snacks to aid with blood sugar control. She continues regular follow-up with sleep medicine regarding sleep walking/poor sleep; she recently started melatonin which she reports has been helpful. Sharnelle will continue to benefit from participating in pulmonary rehab for nutrition, exercise, and lifestyle modifcation.      Intervention Plan   Intervention Prescribe, educate and counsel regarding individualized specific dietary modifications aiming towards targeted core components such as weight, hypertension, lipid management, diabetes, heart failure and other comorbidities.;Nutrition handout(s) given to patient.    Expected Outcomes Short Term Goal: Understand basic principles of dietary content, such as calories, fat, sodium, cholesterol and nutrients.;Long Term Goal: Adherence to prescribed nutrition plan.             Nutrition Discharge:  Nutrition Assessments - 04/23/22 0823       Rate Your Plate Scores   Post Score 68             Education Questionnaire Score:  Knowledge Questionnaire Score - 04/23/22 1431       Knowledge Questionnaire Score   Pre Score --    Post Score 17/18             Goals reviewed with patient; copy given to patient.

## 2022-05-04 NOTE — Progress Notes (Signed)
Daily Session Note  Patient Details  Name: Jessica Woodard MRN: [id number] Date of Birth: 03/12/1951 Referring Provider:   April Manson Pulmonary Rehab Walk Test from 02/26/2022 in [facility] [place]  Referring Provider Loanne Drilling       Encounter Date: 05/04/2022  Check In:  Session Check In - 05/04/22 1438       Check-In   Supervising physician immediately available to respond to emergencies [facility] MD immediately available    Physician(s) Eric Form, NP    Location [facility]    Staff Present Janine Ores, RN, Quentin Ore, MS, ACSM-CEP, Exercise Physiologist;Laquasia Pincus Yevonne Pax, ACSM-CEP, Exercise Physiologist;Samantha [place], RD, LDN;Other    Virtual Visit No    Medication changes reported     No    Fall or balance concerns reported    No    Tobacco Cessation No Change    Warm-up and Cool-down Not performed (comment)    Resistance Training Performed No    VAD Patient? No    PAD/SET Patient? No      Pain Assessment   Currently in Pain? No/denies    Multiple Pain Sites No             Capillary Blood Glucose: No results found for this or any previous visit (from the past 24 hour(s)).    Social History   Tobacco Use  Smoking Status Never  Smokeless Tobacco Never    Goals Met:  Independence with exercise equipment Improved SOB with ADL's Exercise tolerated well Personal goals reviewed No report of concerns or symptoms today  Goals Unmet:  Not Applicable  Comments: Pt completed final 6 min walk test today and graduated. Successful in program. Service time is from 1300 to 1323.    Dr. Rodman Pickle is Medical Director for Pulmonary Rehab at [facility] : [facility].

## 2022-05-05 DIAGNOSIS — L84 Corns and callosities: Secondary | ICD-10-CM | POA: Diagnosis not present

## 2022-05-05 DIAGNOSIS — M2041 Other hammer toe(s) (acquired), right foot: Secondary | ICD-10-CM | POA: Diagnosis not present

## 2022-05-05 DIAGNOSIS — I739 Peripheral vascular disease, unspecified: Secondary | ICD-10-CM | POA: Diagnosis not present

## 2022-05-05 DIAGNOSIS — E119 Type 2 diabetes mellitus without complications: Secondary | ICD-10-CM | POA: Diagnosis not present

## 2022-05-05 DIAGNOSIS — G629 Polyneuropathy, unspecified: Secondary | ICD-10-CM | POA: Diagnosis not present

## 2022-05-05 DIAGNOSIS — M2042 Other hammer toe(s) (acquired), left foot: Secondary | ICD-10-CM | POA: Diagnosis not present

## 2022-05-06 ENCOUNTER — Encounter (HOSPITAL_COMMUNITY): Payer: Medicare PPO

## 2022-05-28 ENCOUNTER — Encounter: Payer: Self-pay | Admitting: Internal Medicine

## 2022-05-28 NOTE — Assessment & Plan Note (Signed)
High-dose melatonin has helped some people.  I am not sure if the 5 mg dose she takes is making much difference but she wants to stick with this for now.

## 2022-05-28 NOTE — Assessment & Plan Note (Signed)
She is following now with Dr. Loanne Drilling and expresses no concerns.

## 2022-06-03 DIAGNOSIS — L905 Scar conditions and fibrosis of skin: Secondary | ICD-10-CM | POA: Diagnosis not present

## 2022-06-03 DIAGNOSIS — L299 Pruritus, unspecified: Secondary | ICD-10-CM | POA: Diagnosis not present

## 2022-06-03 DIAGNOSIS — L819 Disorder of pigmentation, unspecified: Secondary | ICD-10-CM | POA: Diagnosis not present

## 2022-06-03 DIAGNOSIS — D485 Neoplasm of uncertain behavior of skin: Secondary | ICD-10-CM | POA: Diagnosis not present

## 2022-06-03 DIAGNOSIS — L308 Other specified dermatitis: Secondary | ICD-10-CM | POA: Diagnosis not present

## 2022-06-03 DIAGNOSIS — L989 Disorder of the skin and subcutaneous tissue, unspecified: Secondary | ICD-10-CM | POA: Diagnosis not present

## 2022-06-08 DIAGNOSIS — H02051 Trichiasis without entropian right upper eyelid: Secondary | ICD-10-CM | POA: Diagnosis not present

## 2022-06-17 ENCOUNTER — Encounter (HOSPITAL_BASED_OUTPATIENT_CLINIC_OR_DEPARTMENT_OTHER): Payer: Self-pay | Admitting: Pulmonary Disease

## 2022-06-17 ENCOUNTER — Ambulatory Visit (HOSPITAL_BASED_OUTPATIENT_CLINIC_OR_DEPARTMENT_OTHER): Payer: Medicare PPO | Admitting: Pulmonary Disease

## 2022-06-17 VITALS — BP 110/62 | HR 63 | Ht 62.0 in | Wt 133.8 lb

## 2022-06-17 DIAGNOSIS — D869 Sarcoidosis, unspecified: Secondary | ICD-10-CM

## 2022-06-17 NOTE — Patient Instructions (Addendum)
Intermittent asthma - well-controlled Sarcoidosis --CONTINUE Alvesco 80 mcg ONE puff twice a day --CONTINUE montelukast 10 mg daily --Completed pulmonary rehab in 04/2022 --Continue to exercise 3-5 days  Reduced DLCO ORDER pulmonary function test in 6 months with PFTs prior visit If decreasing, will need further work-up including CT chest and echocardiogram

## 2022-06-17 NOTE — Progress Notes (Signed)
Subjective:   PATIENT ID: Jessica Woodard GENDER: female DOB: 1950/09/01, MRN: [id number]   HPI  Chief Complaint  Patient presents with   Follow-up    Breathing somewhat better doing pulm rehab on her own    Reason for Visit: Follow-up  [name] is a 72 year old female never smoker with asthma, allergic rhinitis, sarcoid and GERD who presents for follow-up.  Synopsis: Diagnosed with sarcoid by ophthalmologist >10 years. Currently followed by Ophthalmologist, Dr. Katy Fitch. Previously followed by Dr. Lake Bells with no pulmonary involvement. Lost to follow-up and re-established care with Pulmonary in 2021. Recently she was advised by Dermatology for a skin biopsy last month for a new lesion on her face however she declined.  04/04/20 Overall her asthma symptoms are well-controlled. She reports using Alvesco ONE puff once a day in the morning. She notices shortness of breath with occasional wheezing with heavy exertion like raking leaves etc. Does not use a rescue inhaler. She does notice symptoms occurring at night when laying down.  She reports left facial hyperpigmentation that has been present for the last two years and new right hyperpigmented spot on her right hand. She has previously been advised to have biopsy and is awaiting appointment with [facility] in October. She is concerned what this lesion is and has not had any answers. Does not improve with any OTC creams or lotions.  05/18/21 Our last pulmonary visit was over 1 year ago.  Her exam was noted for sinus bradycardia and cardiac MRI was ordered which was negative for sarcoid involvement. She is also seeing her dermatologist for maturational hyperpigmentation which is improving on Hydroquinone cream. She is scheduled for her eye doctor for sarcoid for which she routinely is seeing every 6 months.   She is compliant with Alvesco. Denies shortness of breath, cough, wheezing and chest pain. She was also seen by [facility]  Neurology for left foot drop however unable to complete EMG.  She reports sleeping 10:30/11pm to 7/8am. She falls asleep easily but is waking up at night. She reports sleep walking including opening doors and moving objects.   07/28/21 She reports unable to walk longer distances due to fatigue and legs giving out. Denies shortness of breath, significant wheezing or coughing. She has reflux and has reduced chocolate, milk or caffeine and this has improved her cough. She is still awaiting her sleep evaluation. She reports chronic left leg pain.   12/14/21 Since her covid dx in July, she has continued to have significant fatigue and exhaustion. She had sleep study completed but she continues to be active at night including singing. Previously getting out of bed and opening doors. Has been taking melatonin 5 mg which has helped some and her fiance notes that she is less active. She reports she could close her eyes and fall asleep very quickly.  06/17/22 She has graduated pulmonary rehab. [facility]cles between those days for 30 minute sessions. She reports her energy is improved. Before she struggled with walking to the kitchen but now she doesn't think twice.  Denies shortness of breath, cough or wheezing. Compliant on Alvesco and does not use albuterol inhaler.  Social History: Never smoker  Past Medical History:  Diagnosis Date   Allergic rhinitis, cause unspecified    Allergy    seasonal    Anxiety    sleep walking/night terrors   Asthma    inhalers   BRONCHITIS, ACUTE  04/29/2009   Qualifier: Diagnosis of  By: Annamaria Boots MD, Clinton D    GERD (gastroesophageal reflux disease)    Hypertension    Sarcoid      Family History  Problem Relation Age of Onset   Asthma Mother    Colon polyps Mother    Diabetes Mother    Heart disease Mother    Stroke Mother    Kidney disease Mother    Asthma Maternal Grandmother    Asthma Maternal Grandfather    Asthma  Paternal Grandfather    Asthma Paternal Grandmother    Asthma Sister    Breast cancer Sister    Diabetes Sister    Asthma Brother    Colon polyps Brother    Heart disease Brother    Colon cancer Neg Hx      Social History   Occupational History   Occupation: Retired Pharmacist, hospital  Tobacco Use   Smoking status: Never   Smokeless tobacco: Never  Vaping Use   Vaping Use: Never used  Substance and Sexual Activity   Alcohol use: No    Alcohol/week: 0.0 standard drinks of alcohol   Drug use: No   Sexual activity: Not on file    Allergies  Allergen Reactions   Sulfa Antibiotics Rash   Cefdinir     REACTION: diarrhea   Other Itching   Roflumilast Diarrhea   Sulfonamide Derivatives Rash     Outpatient Medications Prior to Visit  Medication Sig Dispense Refill   albuterol (VENTOLIN HFA) 108 (90 Base) MCG/ACT inhaler Inhale 2 puffs into the lungs every 6 (six) hours as needed for wheezing or shortness of breath. 1 each 3   APPLE CIDER VINEGAR PO See admin instructions.     Ascorbic Acid (VITAMIN C) 100 MG tablet Take 100 mg by mouth daily.     aspirin 81 MG tablet Take 81 mg by mouth daily.     Cholecalciferol (VITAMIN D3) 250 MCG (10000 UT) capsule 2 capsules     ciclesonide (ALVESCO) 80 MCG/ACT inhaler Inhale 1 puff into the lungs 2 (two) times daily.     cyanocobalamin (VITAMIN B12) 1000 MCG tablet Take 1,000 mcg by mouth daily.     lansoprazole (PREVACID) 30 MG capsule Take 30 mg by mouth 2 (two) times daily before a meal.     Melatonin 5 MG/15ML LIQD Take 5 mg by mouth at bedtime.     metFORMIN (GLUCOPHAGE-XR) 500 MG 24 hr tablet      metoprolol (TOPROL-XL) 50 MG 24 hr tablet Take 75 mg by mouth daily.     Misc Natural Products (ELDERBERRY IMMUNE COMPLEX PO) Take 50 mg by mouth daily.     montelukast (SINGULAIR) 10 MG tablet Take 10 mg by mouth at bedtime.     Omega-3 Fatty Acids (FISH OIL) 1200 MG CAPS Take 1 capsule by mouth daily.     OneTouch Delica Lancets [id number] MISC       Propylene Glycol 0.6 % SOLN Apply to eye.     rosuvastatin (CRESTOR) 5 MG tablet      spironolactone (ALDACTONE) 25 MG tablet      telmisartan (MICARDIS) 40 MG tablet TAKE 1 TABLET BY MOUTH EVERY DAY FOR BLOOD PRESSURE     valACYclovir (VALTREX) 500 MG tablet Take 1 tablet by mouth Daily.     No facility-administered medications prior to visit.    Review of Systems  Constitutional:  Negative for chills, diaphoresis, fever, malaise/fatigue and weight loss.  HENT:  Negative for  congestion.   Respiratory:  Negative for cough, hemoptysis, sputum production, shortness of breath and wheezing.   Cardiovascular:  Negative for chest pain, palpitations and leg swelling.    Objective:   Vitals:   06/17/22 1037  BP: 110/62  Pulse: 63  SpO2: 97%  Weight: 133 lb 12.8 oz (60.7 kg)  Height: 5\' 2"  (1.575 m)   SpO2: 97 % O2 Device: None (Room air)  Physical Exam: General: Well-appearing, no acute distress HENT: [place], AT Eyes: EOMI, no scleral icterus Respiratory: Clear to auscultation bilaterally.  No crackles, wheezing or rales Cardiovascular: RRR, -M/R/G, no JVD Extremities:-Edema,-tenderness Neuro: AAO x4, CNII-XII grossly intact Psych: Normal mood, normal affect  Data Reviewed:  Imaging: CXR 12/28/19 - Normal CXR. No infiltrates, effusion or edema. No hilar adenopathy   PFT: 01/22/20 FVC 2.12 (97%) FEV1 1.86 (110%) Ratio 86  TLC 76% DLCO 86% Interpretation: Mild restrictive defect with normal gas exchange.  07/28/21 FVC 1.97 (91%) FEV1 1.76 (106%) Ratio 86  TLC 75% DLCO 67% Interpretation: Mild restrictive defect with reduction in DLCO compared to 01/22/20. Clinically correlate   Labs: CBC    Component Value Date/Time   WBC 6.5 05/14/2010 0934   RBC 4.31 05/14/2010 0934   HGB 14.0 09/03/2010 1253   HCT 39.8 05/14/2010 0934   PLT 213.0 05/14/2010 0934   MCV 92.3 05/14/2010 0934   MCHC 34.5 05/14/2010 0934   RDW 12.7 05/14/2010 0934   LYMPHSABS 2.7 05/14/2010 0934    MONOABS 0.5 05/14/2010 0934   EOSABS 0.5 05/14/2010 0934   BASOSABS 0.0 05/14/2010 0934   BMET    Component Value Date/Time   NA 142 09/02/2010 1330   K 4.3 09/02/2010 1330   CL 105 09/02/2010 1330   CO2 31 09/02/2010 1330   GLUCOSE 87 09/02/2010 1330   BUN 11 09/02/2010 1330   CREATININE 0.81 09/02/2010 1330   CALCIUM 9.7 09/02/2010 1330   GFRNONAA >60 09/02/2010 1330   GFRAA >60 09/02/2010 1330       No data to display         [facility] 01/28/22 Glu 99 BUN/Cr 19/1.16 GFR 50 Na 140 K 5 Cl 105 CO2 29 AG 10.9 Ca 10.1 Albumin 4.5 ALP 62 AST 19 ALT 22   Assessment & Plan:   Discussion: 72 year old female with remote hx of extrapulmonary sarcoid involving eye, asthma and laryngeal pharyngeal reflux who presents for follow-up. Fatigue has improved after rehab and regular exercise. Sleep also is better controlled. Less suspicious for sarcoid relapse at this point. Deconditioning was likely contributing previously.  We reviewed clinical course of COVID-19 including long-term complications including post-inflammatory lung disease and long hauler symptoms.  We discussed the clinical course of sarcoid and management including serial PFTs, labs, eye exam, and EKG and chest imaging if indicated. If symptoms suggest sarcoid flare in the future, we would manage with steroids +/- biologics.  COVID long hauler - dx in July 2023 Intermittent asthma - well-controlled --CONTINUE Alvesco 80 mcg ONE puff twice a day. Patient has own supply --CONTINUE montelukast 10 mg daily --Completed pulmonary rehab in 04/2022 --Continue to exercise 3-5 days  Reduced DLCO ORDER pulmonary function test in 6 months with PFTs prior visit If decreasing, will need further work-up including CT chest and echocardiogram  REM sleep behavior disorder --On melatonin --Followed by Dr. Annamaria Boots  Fatigue - improved --Hold on PET/CT sarcoid  History of sarcoid with eye involvement Mild restrictive  defect Mild reduction DLCO reduction --Diagnosed  by her ophthalmologist >10 years ago --No indication for prednisone therapy --Annual PFTs. Last 07/28/21 --Recommend annual ophthalmology exam. Last exam: [facility] 05/11/22 - No sarcoid findings --EKG showed sinus bradycardia.   Sinus bradycardia --EKG reviewed and confirmed SB  Laryngopharyngeal Reflux --Previously followed by ENT at [facility] with Dr. Redmond Baseman --Continue Lansoprazole 30 mg BID   Health Maintenance Immunization History  Administered Date(s) Administered   Influenza Split 02/24/2009, 01/14/2014, 12/14/2014, 01/15/2015, 12/11/2015, 11/30/2016, 11/19/2017, 01/01/2019, 12/06/2019   Influenza Whole 01/13/2009, 12/22/2009   Influenza, High Dose Seasonal PF 12/31/2020   Influenza-Unspecified 01/21/2011   PFIZER(Purple Top)SARS-COV-2 Vaccination 04/19/2019, 05/10/2019, 12/24/2019   Pneumococcal Conjugate-13 04/17/2014   Pneumococcal Polysaccharide-23 04/17/2015   Td 06/15/2005   Tdap 09/11/2013   Zoster, Live 10/14/2014, 12/31/2020, 04/02/2021   CT Lung Screen - not qualified. Never smoker  Orders Placed This Encounter  Procedures   Pulmonary function test    Standing Status:   Future    Standing Expiration Date:   06/17/2023    Order Specific Question:   Where should this test be performed?    Answer:   [facility]    Order Specific Question:   Full PFT: includes the following: basic spirometry, spirometry pre & post bronchodilator, diffusion capacity (DLCO), lung volumes    Answer:   Full PFT   No orders of the defined types were placed in this encounter.   Return in about 3 months (around 09/16/2022) for after PFT, with Dr. Loanne Drilling.  I have spent a total time of 35-minutes on the day of the appointment including chart review, data review, collecting history, coordinating care and discussing medical diagnosis and plan with the patient/family. Past medical history, allergies, medications were reviewed.  Pertinent imaging, labs and tests included in this note have been reviewed and interpreted independently by me.  [place], MD [facility] 06/17/2022 12:00 PM  Office Number [phone]

## 2022-08-04 DIAGNOSIS — J45909 Unspecified asthma, uncomplicated: Secondary | ICD-10-CM | POA: Diagnosis not present

## 2022-08-04 DIAGNOSIS — J309 Allergic rhinitis, unspecified: Secondary | ICD-10-CM | POA: Diagnosis not present

## 2022-08-04 DIAGNOSIS — M79675 Pain in left toe(s): Secondary | ICD-10-CM | POA: Diagnosis not present

## 2022-08-04 DIAGNOSIS — I10 Essential (primary) hypertension: Secondary | ICD-10-CM | POA: Diagnosis not present

## 2022-08-04 DIAGNOSIS — E7800 Pure hypercholesterolemia, unspecified: Secondary | ICD-10-CM | POA: Diagnosis not present

## 2022-08-04 DIAGNOSIS — D869 Sarcoidosis, unspecified: Secondary | ICD-10-CM | POA: Diagnosis not present

## 2022-08-04 DIAGNOSIS — E1169 Type 2 diabetes mellitus with other specified complication: Secondary | ICD-10-CM | POA: Diagnosis not present

## 2022-08-04 DIAGNOSIS — K219 Gastro-esophageal reflux disease without esophagitis: Secondary | ICD-10-CM | POA: Diagnosis not present

## 2022-08-04 DIAGNOSIS — E1122 Type 2 diabetes mellitus with diabetic chronic kidney disease: Secondary | ICD-10-CM | POA: Diagnosis not present

## 2022-08-04 DIAGNOSIS — N183 Chronic kidney disease, stage 3 unspecified: Secondary | ICD-10-CM | POA: Diagnosis not present

## 2022-11-02 DIAGNOSIS — Z1231 Encounter for screening mammogram for malignant neoplasm of breast: Secondary | ICD-10-CM | POA: Diagnosis not present

## 2022-12-23 ENCOUNTER — Encounter (HOSPITAL_BASED_OUTPATIENT_CLINIC_OR_DEPARTMENT_OTHER): Payer: Self-pay | Admitting: Pulmonary Disease

## 2022-12-23 ENCOUNTER — Ambulatory Visit (HOSPITAL_BASED_OUTPATIENT_CLINIC_OR_DEPARTMENT_OTHER): Payer: Medicare PPO | Admitting: Pulmonary Disease

## 2022-12-23 ENCOUNTER — Other Ambulatory Visit (HOSPITAL_COMMUNITY): Payer: Self-pay

## 2022-12-23 VITALS — BP 104/64 | HR 69 | Resp 16 | Ht 62.0 in | Wt 135.4 lb

## 2022-12-23 DIAGNOSIS — D869 Sarcoidosis, unspecified: Secondary | ICD-10-CM

## 2022-12-23 DIAGNOSIS — J4530 Mild persistent asthma, uncomplicated: Secondary | ICD-10-CM

## 2022-12-23 DIAGNOSIS — Z23 Encounter for immunization: Secondary | ICD-10-CM | POA: Diagnosis not present

## 2022-12-23 LAB — PULMONARY FUNCTION TEST
DL/VA % pred: 83 %
DL/VA: 3.51 ml/min/mmHg/L
DLCO cor % pred: 65 %
DLCO cor: 11.79 ml/min/mmHg
DLCO unc % pred: 65 %
DLCO unc: 11.79 ml/min/mmHg
FEF 25-75 Post: 2.75 L/s
FEF 25-75 Pre: 1.87 L/s
FEF2575-%Change-Post: 46 %
FEF2575-%Pred-Post: 162 %
FEF2575-%Pred-Pre: 110 %
FEV1-%Change-Post: 7 %
FEV1-%Pred-Post: 88 %
FEV1-%Pred-Pre: 81 %
FEV1-Post: 1.78 L
FEV1-Pre: 1.65 L
FEV1FVC-%Change-Post: 5 %
FEV1FVC-%Pred-Pre: 109 %
FEV6-%Change-Post: 1 %
FEV6-%Pred-Post: 79 %
FEV6-%Pred-Pre: 78 %
FEV6-Post: 2.04 L
FEV6-Pre: 2.01 L
FEV6FVC-%Pred-Post: 105 %
FEV6FVC-%Pred-Pre: 105 %
FVC-%Change-Post: 1 %
FVC-%Pred-Post: 75 %
FVC-%Pred-Pre: 74 %
FVC-Post: 2.04 L
FVC-Pre: 2.01 L
Post FEV1/FVC ratio: 87 %
Post FEV6/FVC ratio: 100 %
Pre FEV1/FVC ratio: 82 %
Pre FEV6/FVC Ratio: 100 %
RV % pred: 74 %
RV: 1.58 L
TLC % pred: 83 %
TLC: 3.95 L

## 2022-12-23 MED ORDER — ALBUTEROL SULFATE HFA 108 (90 BASE) MCG/ACT IN AERS: 2.0000 | INHALATION_SPRAY | Freq: Four times a day (QID) | RESPIRATORY_TRACT | 3 refills | Status: AC | PRN

## 2022-12-23 MED ORDER — FLUTICASONE-SALMETEROL 100-50 MCG/ACT IN AEPB: 1.0000 | INHALATION_SPRAY | Freq: Two times a day (BID) | RESPIRATORY_TRACT | 5 refills | Status: DC

## 2022-12-23 NOTE — Progress Notes (Signed)
Subjective:   PATIENT ID: [name] GENDER: female DOB: Jun 19, 1950, MRN: [id number]   HPI  Chief Complaint  Patient presents with   Follow-up    PFT review, Breathing on occasion she has trouble when being active, used to be able to walk before covid but can only walk when cool/cold now not in heat. Is getting flu shot downstairs today    Reason for Visit: Follow-up  [name] is a 72 year old female never smoker with asthma, allergic rhinitis, sarcoid and GERD who presents for follow-up.  Synopsis: Diagnosed with sarcoid by ophthalmologist >10 years. Currently followed by Ophthalmologist, Dr. Dione Booze. Previously followed by Dr. Kendrick Fries with no pulmonary involvement. Lost to follow-up and re-established care with Pulmonary in 2021. Recently she was advised by Dermatology for a skin biopsy last month for a new lesion on her face however she declined.  04/04/20 Overall her asthma symptoms are well-controlled. She reports using Alvesco ONE puff once a day in the morning. She notices shortness of breath with occasional wheezing with heavy exertion like raking leaves etc. Does not use a rescue inhaler. She does notice symptoms occurring at night when laying down.  She reports left facial hyperpigmentation that has been present for the last two years and new right hyperpigmented spot on her right hand. She has previously been advised to have biopsy and is awaiting appointment with [facility] in October. She is concerned what this lesion is and has not had any answers. Does not improve with any OTC creams or lotions.  05/18/21 Our last pulmonary visit was over 1 year ago.  Her exam was noted for sinus bradycardia and cardiac MRI was ordered which was negative for sarcoid involvement. She is also seeing her dermatologist for maturational hyperpigmentation which is improving on Hydroquinone cream. She is scheduled for her eye doctor for sarcoid for which she routinely is seeing every 6  months.   She is compliant with Alvesco. Denies shortness of breath, cough, wheezing and chest pain. She was also seen by [facility] for left foot drop however unable to complete EMG.  She reports sleeping 10:30/11pm to 7/8am. She falls asleep easily but is waking up at night. She reports sleep walking including opening doors and moving objects.   07/28/21 She reports unable to walk longer distances due to fatigue and legs giving out. Denies shortness of breath, significant wheezing or coughing. She has reflux and has reduced chocolate, milk or caffeine and this has improved her cough. She is still awaiting her sleep evaluation. She reports chronic left leg pain.   12/14/21 Since her covid dx in July, she has continued to have significant fatigue and exhaustion. She had sleep study completed but she continues to be active at night including singing. Previously getting out of bed and opening doors. Has been taking melatonin 5 mg which has helped some and her fiance notes that she is less active. She reports she could close her eyes and fall asleep very quickly.  06/17/22 She has [facility]cles between those days for 30 minute sessions. She reports her energy is improved. Before she struggled with walking to the kitchen but now she doesn't think twice.  Denies shortness of breath, cough or wheezing. Compliant on Alvesco and does not use albuterol inhaler.  12/19/22 Since our last visit she reports good and bad days. [facility] and working  in the yard. Shortness of breath with exertion. Has had daily cough with some mucous production that usually worsens in fall/winter. Wheezing seems to occur with exertion. Compliant with Alvesco. Uses albuterol inhaler twice a month. Uses a cane when she travels at the airport due to leg issues.   Social History: Never smoker  Past Medical History:  Diagnosis Date   Allergic  rhinitis, cause unspecified    Allergy    seasonal    Anxiety    sleep walking/night terrors   Asthma    inhalers   BRONCHITIS, ACUTE 04/29/2009   Qualifier: Diagnosis of  By: Maple Hudson MD, Clinton D    GERD (gastroesophageal reflux disease)    Hypertension    Sarcoid      Family History  Problem Relation Age of Onset   Asthma Mother    Colon polyps Mother    Diabetes Mother    Heart disease Mother    Stroke Mother    Kidney disease Mother    Asthma Maternal Grandmother    Asthma Maternal Grandfather    Asthma Paternal Grandfather    Asthma Paternal Grandmother    Asthma Sister    Breast cancer Sister    Diabetes Sister    Asthma Brother    Colon polyps Brother    Heart disease Brother    Colon cancer Neg Hx      Social History   Occupational History   Occupation: Retired Runner, broadcasting/film/video  Tobacco Use   Smoking status: Never   Smokeless tobacco: Never  Vaping Use   Vaping status: Never Used  Substance and Sexual Activity   Alcohol use: No    Alcohol/week: 0.0 standard drinks of alcohol   Drug use: No   Sexual activity: Not on file    Allergies  Allergen Reactions   Sulfa Antibiotics Rash   Cefdinir     REACTION: diarrhea   Other Itching   Roflumilast Diarrhea   Sulfonamide Derivatives Rash     Outpatient Medications Prior to Visit  Medication Sig Dispense Refill   APPLE CIDER VINEGAR PO See admin instructions.     Ascorbic Acid (VITAMIN C) 100 MG tablet Take 100 mg by mouth daily.     aspirin 81 MG tablet Take 81 mg by mouth daily.     Cholecalciferol (VITAMIN D3) 250 MCG (10000 UT) capsule 2 capsules     cyanocobalamin (VITAMIN B12) 1000 MCG tablet Take 1,000 mcg by mouth daily.     Melatonin 5 MG/15ML LIQD Take 5 mg by mouth at bedtime.     metFORMIN (GLUCOPHAGE-XR) 500 MG 24 hr tablet      metoprolol (TOPROL-XL) 50 MG 24 hr tablet Take 75 mg by mouth daily.     Misc Natural Products (ELDERBERRY IMMUNE COMPLEX PO) Take 50 mg by mouth daily.     montelukast  (SINGULAIR) 10 MG tablet Take 10 mg by mouth at bedtime.     Omega-3 Fatty Acids (FISH OIL) 1200 MG CAPS Take 1 capsule by mouth daily.     OneTouch Delica Lancets 33G MISC      Propylene Glycol 0.6 % SOLN Apply to eye.     rosuvastatin (CRESTOR) 5 MG tablet      spironolactone (ALDACTONE) 25 MG tablet      telmisartan (MICARDIS) 40 MG tablet TAKE 1 TABLET BY MOUTH EVERY DAY FOR BLOOD PRESSURE     valACYclovir (VALTREX) 500 MG tablet Take 1 tablet by mouth Daily.     albuterol (VENTOLIN HFA)  108 (90 Base) MCG/ACT inhaler Inhale 2 puffs into the lungs every 6 (six) hours as needed for wheezing or shortness of breath. 1 each 3   ciclesonide (ALVESCO) 80 MCG/ACT inhaler Inhale 1 puff into the lungs 2 (two) times daily.     lansoprazole (PREVACID) 30 MG capsule Take 30 mg by mouth 2 (two) times daily before a meal.     No facility-administered medications prior to visit.    Review of Systems  Constitutional:  Negative for chills, diaphoresis, fever, malaise/fatigue and weight loss.  HENT:  Negative for congestion.   Respiratory:  Positive for cough, sputum production and shortness of breath. Negative for hemoptysis and wheezing.   Cardiovascular:  Negative for chest pain, palpitations and leg swelling.    Objective:   Vitals:   12/23/22 1019  BP: 104/64  Pulse: 69  Resp: 16  SpO2: 98%  Weight: 135 lb 6.4 oz (61.4 kg)  Height: 5\' 2"  (1.575 m)   SpO2: 98 %  Physical Exam: General: Well-appearing, no acute distress HENT: [place], AT Eyes: EOMI, no scleral icterus Respiratory: Clear to auscultation bilaterally.  No crackles, wheezing or rales Cardiovascular: RRR, -M/R/G, no JVD Extremities:-Edema,-tenderness Neuro: AAO x4, CNII-XII grossly intact Psych: Normal mood, normal affect  Data Reviewed:  Imaging: CXR 12/28/19 - Normal CXR. No infiltrates, effusion or edema. No hilar adenopathy   PFT: 01/22/20 FVC 2.12 (97%) FEV1 1.86 (110%) Ratio 86  TLC 76% DLCO 86% Interpretation:  Mild restrictive defect with normal gas exchange.  07/28/21 FVC 1.97 (91%) FEV1 1.76 (106%) Ratio 86  TLC 75% DLCO 67% Interpretation: Mild restrictive defect with reduction in DLCO compared to 01/22/20. Clinically correlate  12/23/22 FVC 2.04 (75%) FEV1 1.78 (88%) Ratio 82  TLC 83% DLCO 65% Interpretation: No obstructive or restrictive defect. Stable DLCO compared to 07/28/21. Clinically correlate  Labs: CBC    Component Value Date/Time   WBC 6.5 05/14/2010 0934   RBC 4.31 05/14/2010 0934   HGB 14.0 09/03/2010 1253   HCT 39.8 05/14/2010 0934   PLT 213.0 05/14/2010 0934   MCV 92.3 05/14/2010 0934   MCHC 34.5 05/14/2010 0934   RDW 12.7 05/14/2010 0934   LYMPHSABS 2.7 05/14/2010 0934   MONOABS 0.5 05/14/2010 0934   EOSABS 0.5 05/14/2010 0934   BASOSABS 0.0 05/14/2010 0934   BMET    Component Value Date/Time   NA 142 09/02/2010 1330   K 4.3 09/02/2010 1330   CL 105 09/02/2010 1330   CO2 31 09/02/2010 1330   GLUCOSE 87 09/02/2010 1330   BUN 11 09/02/2010 1330   CREATININE 0.81 09/02/2010 1330   CALCIUM 9.7 09/02/2010 1330   GFRNONAA >60 09/02/2010 1330   GFRAA >60 09/02/2010 1330       No data to display         [facility] CMET 01/28/22 Glu 99 BUN/Cr 19/1.16 GFR 50 Na 140 K 5 Cl 105 CO2 29 AG 10.9 Ca 10.1 Albumin 4.5 ALP 62 AST 19 ALT 22   Assessment & Plan:   Discussion: 72 year old female with remote hx of extrapulmonary sarcoid involving eye, asthma and laryngeal pharyngeal reflux who presents for follow-up. We reviewed clinical course of COVID-19 including long-term complications including post-inflammatory lung disease and long hauler symptoms. PFTs with resolved restrictive defect and stable DLCO which is mildly reduced. Encouraged regular aerobic exercise. Worsening asthma symptoms with room to step up therapy. If bronchodilators are maximized and still symptomatic, may need to rule out sarcoid relapse.  COVID  long hauler - dx in July 2023 Intermittent  asthma - symptomatic. Will step up --STOP Alvesco 80 mcg  --START Wixela 100-50 mcg ONE puff in the morning and evening. Rinse mouth out after use --CONTINUE montelukast 10 mg daily --Completed pulmonary rehab in 04/2022 --Continue to exercise 3-5 days  Reduced DLCO - stable Mild restrictive defect - resolved Reviewed PFTs. Resolved restrictive defect. Stable DLCO Plan for repeat PFTs in 1 year If decreasing in the future, will need further work-up including CT chest and echocardiogram  REM sleep behavior disorder --On melatonin --Followed by Dr. Maple Hudson  Fatigue - improved --Hold on PET/CT sarcoid  History of sarcoid with eye involvement --Diagnosed by her ophthalmologist >10 years ago --No indication for prednisone therapy --Annual PFTs. Last 07/28/21 --Recommend annual ophthalmology exam. Last exam: [facility] 05/11/22 - No sarcoid findings --EKG showed sinus bradycardia.   Sinus bradycardia --EKG reviewed and confirmed SB  Laryngopharyngeal Reflux --Previously [facility] with Dr. Jenne Pane --Continue Lansoprazole 30 mg BID   Health Maintenance Immunization History  Administered Date(s) Administered   Fluad Trivalent(High Dose 65+) 12/23/2022   Influenza Split 02/24/2009, 01/14/2014, 12/14/2014, 01/15/2015, 12/11/2015, 11/30/2016, 11/19/2017, 01/01/2019, 12/06/2019   Influenza Whole 01/13/2009, 12/22/2009   Influenza, High Dose Seasonal PF 12/31/2020   Influenza-Unspecified 01/21/2011   PFIZER(Purple Top)SARS-COV-2 Vaccination 04/19/2019, 05/10/2019, 12/24/2019, 12/15/2022   Pneumococcal Conjugate-13 04/17/2014   Pneumococcal Polysaccharide-23 04/17/2015   Td 06/15/2005   Tdap 09/11/2013   Zoster, Live 10/14/2014, 12/31/2020, 04/02/2021   CT Lung Screen - not qualified. Never smoker  Orders Placed This Encounter  Procedures   Flu Vaccine Trivalent High Dose (Fluad)   Meds ordered this encounter  Medications   fluticasone-salmeterol (WIXELA  INHUB) 100-50 MCG/ACT AEPB    Sig: Inhale 1 puff into the lungs 2 (two) times daily.    Dispense:  60 each    Refill:  5   albuterol (VENTOLIN HFA) 108 (90 Base) MCG/ACT inhaler    Sig: Inhale 2 puffs into the lungs every 6 (six) hours as needed for wheezing or shortness of breath.    Dispense:  1 each    Refill:  3    Return for January 2025.  I have spent a total time of 32-minutes on the day of the appointment including chart review, data review, collecting history, coordinating care and discussing medical diagnosis and plan with the patient/family. Past medical history, allergies, medications were reviewed. Pertinent imaging, labs and tests included in this note have been reviewed and interpreted independently by me.  Eliga Arvie Mechele Collin, MD [facility] 12/23/2022 1:01 PM  Office Number [phone]

## 2022-12-23 NOTE — Patient Instructions (Signed)
Full PFT Performed Today  

## 2022-12-23 NOTE — Patient Instructions (Addendum)
COVID long hauler - dx in July 2023 Intermittent asthma - symptomatic. Will step up --STOP Alvesco 80 mcg  --START Wixela 100-50 mcg ONE puff in the morning and evening. Rinse mouth out after use --CONTINUE montelukast 10 mg daily --Completed pulmonary rehab in 04/2022 --Continue to exercise 3-5 days  Reduced DLCO - stable Reviewed PFTs. Stable DLCO Plan for repeat PFTs in 1 year If decreasing in the future, will need further work-up including CT chest and echocardiogram

## 2022-12-23 NOTE — Progress Notes (Signed)
Full PFT Performed Today  

## 2022-12-24 ENCOUNTER — Telehealth: Payer: Self-pay | Admitting: Pulmonary Disease

## 2022-12-24 NOTE — Telephone Encounter (Signed)
Spoke with patient regarding prior message   AVS States: START Wixela 100-50 mcg ONE puff in the morning and evening. Rinse mouth out after [place]    PT was seen yesterday by Dr. Everardo All. She is calling saying she is lactose intolerant and wants a different inhaler because this product may contain lactose. Pls call @ [phone]   Pharm is : [facility] on [address]  Dr.Ellison can you please advise.  Thank you

## 2022-12-24 NOTE — Telephone Encounter (Signed)
AVS States: START Wixela 100-50 mcg ONE puff in the morning and evening. Rinse mouth out after [place]   PT was seen yesterday by Dr. Everardo All. She is calling saying she is lactose intolerant and wants a different inhaler because this product may contain lactose. Pls call @ [phone]  Pharm is : [facility] on [address]

## 2022-12-27 ENCOUNTER — Other Ambulatory Visit (HOSPITAL_COMMUNITY): Payer: Self-pay

## 2022-12-27 DIAGNOSIS — Z124 Encounter for screening for malignant neoplasm of cervix: Secondary | ICD-10-CM | POA: Diagnosis not present

## 2022-12-27 DIAGNOSIS — Z6824 Body mass index (BMI) 24.0-24.9, adult: Secondary | ICD-10-CM | POA: Diagnosis not present

## 2022-12-27 MED ORDER — ADVAIR HFA 115-21 MCG/ACT IN AERO: 2.0000 | INHALATION_SPRAY | Freq: Two times a day (BID) | RESPIRATORY_TRACT | 5 refills | Status: DC

## 2022-12-27 NOTE — Telephone Encounter (Addendum)
Due to lactose intolerance, switched from Wixela to Advair HFA 115-21 mcg TWO puffs in the morning and evening.  Discussed with patient who is willing to try. Prefers lower cost ($40) but will see how effective this medication is.

## 2023-02-03 DIAGNOSIS — E118 Type 2 diabetes mellitus with unspecified complications: Secondary | ICD-10-CM | POA: Diagnosis not present

## 2023-02-03 DIAGNOSIS — M25512 Pain in left shoulder: Secondary | ICD-10-CM | POA: Diagnosis not present

## 2023-02-03 DIAGNOSIS — E782 Mixed hyperlipidemia: Secondary | ICD-10-CM | POA: Diagnosis not present

## 2023-02-03 DIAGNOSIS — Z0000 Encounter for general adult medical examination without abnormal findings: Secondary | ICD-10-CM | POA: Diagnosis not present

## 2023-02-03 DIAGNOSIS — I10 Essential (primary) hypertension: Secondary | ICD-10-CM | POA: Diagnosis not present

## 2023-02-03 DIAGNOSIS — R768 Other specified abnormal immunological findings in serum: Secondary | ICD-10-CM | POA: Diagnosis not present

## 2023-02-03 DIAGNOSIS — J45909 Unspecified asthma, uncomplicated: Secondary | ICD-10-CM | POA: Diagnosis not present

## 2023-02-03 DIAGNOSIS — N183 Chronic kidney disease, stage 3 unspecified: Secondary | ICD-10-CM | POA: Diagnosis not present

## 2023-02-03 DIAGNOSIS — E1122 Type 2 diabetes mellitus with diabetic chronic kidney disease: Secondary | ICD-10-CM | POA: Diagnosis not present

## 2023-03-08 DIAGNOSIS — M25511 Pain in right shoulder: Secondary | ICD-10-CM | POA: Diagnosis not present

## 2023-03-08 DIAGNOSIS — M25512 Pain in left shoulder: Secondary | ICD-10-CM | POA: Diagnosis not present

## 2023-03-17 ENCOUNTER — Other Ambulatory Visit (HOSPITAL_BASED_OUTPATIENT_CLINIC_OR_DEPARTMENT_OTHER): Payer: Self-pay

## 2023-03-17 ENCOUNTER — Encounter (HOSPITAL_BASED_OUTPATIENT_CLINIC_OR_DEPARTMENT_OTHER): Payer: Self-pay | Admitting: Pulmonary Disease

## 2023-03-17 ENCOUNTER — Ambulatory Visit (HOSPITAL_BASED_OUTPATIENT_CLINIC_OR_DEPARTMENT_OTHER): Payer: Medicare PPO | Admitting: Pulmonary Disease

## 2023-03-17 VITALS — BP 126/70 | HR 61 | Resp 14 | Ht 62.0 in | Wt 133.8 lb

## 2023-03-17 DIAGNOSIS — Z8616 Personal history of COVID-19: Secondary | ICD-10-CM | POA: Diagnosis not present

## 2023-03-17 DIAGNOSIS — D869 Sarcoidosis, unspecified: Secondary | ICD-10-CM | POA: Diagnosis not present

## 2023-03-17 DIAGNOSIS — J4530 Mild persistent asthma, uncomplicated: Secondary | ICD-10-CM

## 2023-03-17 MED ORDER — LANSOPRAZOLE 30 MG PO CPDR: 30.0000 mg | DELAYED_RELEASE_CAPSULE | Freq: Every day | ORAL | 5 refills | Status: DC

## 2023-03-17 MED ORDER — RSVPREF3 VAC RECOMB ADJUVANTED 120 MCG/0.5ML IM SUSR
0.5000 mL | Freq: Once | INTRAMUSCULAR | 0 refills | Status: AC
  Filled 2023-03-17: qty 0.5, 1d supply, fill #0

## 2023-03-17 MED ORDER — ADVAIR HFA 115-21 MCG/ACT IN AERO: 2.0000 | INHALATION_SPRAY | Freq: Two times a day (BID) | RESPIRATORY_TRACT | 11 refills | Status: AC

## 2023-03-17 NOTE — Patient Instructions (Signed)
 COVID long hauler - dx in July 2023 Intermittent asthma - symptomatic. Will step up --STOP Alvesco  80 mcg  --STOP Wixela due to lactose allergy --CONTINUE Advair  HFA 115-21 mcg TWO puffs in the morning and evening. REFILLED --CONTINUE montelukast 10 mg daily --CONTINUE prevacid  30 mg daily --Completed pulmonary rehab in 04/2022 --RESTART 3-5 days exercise per week

## 2023-03-17 NOTE — Progress Notes (Signed)
 Subjective:   PATIENT ID: Jessica Woodard GENDER: female DOB: 21-Sep-1950, MRN: [id number]   HPI  Chief Complaint  Patient presents with   Follow-up    Interested in RSV Vaccine.     Reason for Visit: Follow-up  Jessica Woodard is a 73 year old female never smoker with asthma, allergic rhinitis, sarcoid and GERD who presents for follow-up.  Synopsis: Diagnosed with sarcoid by ophthalmologist >10 years. Currently followed by Ophthalmologist, Dr. Octavia. Previously followed by Dr. McQuaid with no pulmonary involvement. Lost to follow-up and re-established care with Pulmonary in 2021. Recently she was advised by Dermatology for a skin biopsy last month for a new lesion on her face however she declined.  04/04/20 Overall her asthma symptoms are well-controlled. She reports using Alvesco  ONE puff once a day in the morning. She notices shortness of breath with occasional wheezing with heavy exertion like raking leaves etc. Does not use a rescue inhaler. She does notice symptoms occurring at night when laying down.  She reports left facial hyperpigmentation that has been present for the last two years and new right hyperpigmented spot on her right hand. She has previously been advised to have biopsy and is awaiting appointment with [facility] in October. She is concerned what this lesion is and has not had any answers. Does not improve with any OTC creams or lotions.  05/18/21 Our last pulmonary visit was over 1 year ago.  Her exam was noted for sinus bradycardia and cardiac MRI was ordered which was negative for sarcoid involvement. She is also seeing her dermatologist for maturational hyperpigmentation which is improving on Hydroquinone cream. She is scheduled for her eye doctor for sarcoid for which she routinely is seeing every 6 months.   She is compliant with Alvesco . Denies shortness of breath, cough, wheezing and chest pain. She was also seen by [facility] for left foot drop  however unable to complete EMG.  She reports sleeping 10:30/11pm to 7/8am. She falls asleep easily but is waking up at night. She reports sleep walking including opening doors and moving objects.   07/28/21 She reports unable to walk longer distances due to fatigue and legs giving out. Denies shortness of breath, significant wheezing or coughing. She has reflux and has reduced chocolate, milk or caffeine and this has improved her cough. She is still awaiting her sleep evaluation. She reports chronic left leg pain.   12/14/21 Since her covid dx in July, she has continued to have significant fatigue and exhaustion. She had sleep study completed but she continues to be active at night including singing. Previously getting out of bed and opening doors. Has been taking melatonin 5 mg which has helped some and her fiance notes that she is less active. She reports she could close her eyes and fall asleep very quickly.  06/17/22 She has [facility] between those days for 30 minute sessions. She reports her energy is improved. Before she struggled with walking to the kitchen but now she doesn't think twice.  Denies shortness of breath, cough or wheezing. Compliant on Alvesco  and does not use albuterol  inhaler.  12/19/22 Since our last visit she reports good and bad days. [facility] and working in the yard. Shortness of breath with exertion. Has had daily cough with some mucous production that usually worsens in fall/winter. Wheezing seems to occur with exertion. Compliant with Alvesco .  Uses albuterol  inhaler twice a month. Uses a cane when she travels at the airport due to leg issues.   03/17/23 Since our last visit she has been using Advair  she reports cough has improved but still have breakthrough. Mucous production improved by 50%. No wheezing. Limited activity but did work in [employer].  Social History: Never  smoker  Past Medical History:  Diagnosis Date   Allergic rhinitis, cause unspecified    Allergy    seasonal    Anxiety    sleep walking/night terrors   Asthma    inhalers   BRONCHITIS, ACUTE 04/29/2009   Qualifier: Diagnosis of  By: Neysa MD, Clinton D    GERD (gastroesophageal reflux disease)    Hypertension    Sarcoid      Family History  Problem Relation Age of Onset   Asthma Mother    Colon polyps Mother    Diabetes Mother    Heart disease Mother    Stroke Mother    Kidney disease Mother    Asthma Maternal Grandmother    Asthma Maternal Grandfather    Asthma Paternal Grandfather    Asthma Paternal Grandmother    Asthma Sister    Breast cancer Sister    Diabetes Sister    Asthma Brother    Colon polyps Brother    Heart disease Brother    Colon cancer Neg Hx      Social History   Occupational History   Occupation: Retired runner, broadcasting/film/video  Tobacco Use   Smoking status: Never   Smokeless tobacco: Never  Vaping Use   Vaping status: Never Used  Substance and Sexual Activity   Alcohol use: No    Alcohol/week: 0.0 standard drinks of alcohol   Drug use: No   Sexual activity: Not on file    Allergies  Allergen Reactions   Sulfa Antibiotics Rash   Cefdinir     REACTION: diarrhea   Other Itching   Roflumilast Diarrhea   Sulfonamide Derivatives Rash     Outpatient Medications Prior to Visit  Medication Sig Dispense Refill   ADVAIR  HFA 115-21 MCG/ACT inhaler Inhale 2 puffs into the lungs 2 (two) times daily. 1 each 5   albuterol  (VENTOLIN  HFA) 108 (90 Base) MCG/ACT inhaler Inhale 2 puffs into the lungs every 6 (six) hours as needed for wheezing or shortness of breath. 1 each 3   APPLE CIDER VINEGAR PO See admin instructions.     Ascorbic Acid (VITAMIN C) 100 MG tablet Take 100 mg by mouth daily.     aspirin 81 MG tablet Take 81 mg by mouth daily.     Cholecalciferol (VITAMIN D3) 250 MCG (10000 UT) capsule 2 capsules     cyanocobalamin (VITAMIN B12) 1000 MCG  tablet Take 1,000 mcg by mouth daily.     lansoprazole  (PREVACID ) 30 MG capsule Take 30 mg by mouth daily at 12 noon.     Melatonin 5 MG/15ML LIQD Take 5 mg by mouth at bedtime.     metFORMIN (GLUCOPHAGE-XR) 500 MG 24 hr tablet      metoprolol (TOPROL-XL) 50 MG 24 hr tablet Take 75 mg by mouth daily.     Misc Natural Products (ELDERBERRY IMMUNE COMPLEX PO) Take 50 mg by mouth daily.     montelukast (SINGULAIR) 10 MG tablet Take 10 mg by mouth at bedtime.     Omega-3 Fatty Acids (FISH OIL) 1200 MG CAPS Take 1 capsule by mouth daily.     OneTouch Delica Lancets 33G MISC  Propylene Glycol 0.6 % SOLN Apply to eye.     rosuvastatin (CRESTOR) 5 MG tablet      spironolactone (ALDACTONE) 25 MG tablet      telmisartan (MICARDIS) 40 MG tablet TAKE 1 TABLET BY MOUTH EVERY DAY FOR BLOOD PRESSURE     valACYclovir (VALTREX) 500 MG tablet Take 1 tablet by mouth Daily.     No facility-administered medications prior to visit.    Review of Systems  Constitutional:  Negative for chills, diaphoresis, fever, malaise/fatigue and weight loss.  HENT:  Negative for congestion.   Respiratory:  Positive for cough and sputum production. Negative for hemoptysis, shortness of breath and wheezing.   Cardiovascular:  Negative for chest pain, palpitations and leg swelling.    Objective:   Vitals:   03/17/23 1004  BP: 126/70  Pulse: 61  Resp: 14  SpO2: 99%  Weight: 133 lb 12.8 oz (60.7 kg)  Height: 5' 2 (1.575 m)   SpO2: 99 %  Physical Exam: General: Well-appearing, no acute distress HENT: [place], AT Eyes: EOMI, no scleral icterus Respiratory: Clear to auscultation bilaterally.  No crackles, wheezing or rales Cardiovascular: RRR, -M/R/G, no JVD Extremities:-Edema,-tenderness Neuro: AAO x4, CNII-XII grossly intact Psych: Normal mood, normal affect  Data Reviewed:  Imaging: CXR 12/28/19 - Normal CXR. No infiltrates, effusion or edema. No hilar adenopathy   PFT: 01/22/20 FVC 2.12 (97%) FEV1 1.86  (110%) Ratio 86  TLC 76% DLCO 86% Interpretation: Mild restrictive defect with normal gas exchange.  07/28/21 FVC 1.97 (91%) FEV1 1.76 (106%) Ratio 86  TLC 75% DLCO 67% Interpretation: Mild restrictive defect with reduction in DLCO compared to 01/22/20. Clinically correlate  12/23/22 FVC 2.04 (75%) FEV1 1.78 (88%) Ratio 82  TLC 83% DLCO 65% Interpretation: No obstructive or restrictive defect. Stable DLCO compared to 07/28/21. Clinically correlate  Labs: CBC    Component Value Date/Time   WBC 6.5 05/14/2010 0934   RBC 4.31 05/14/2010 0934   HGB 14.0 09/03/2010 1253   HCT 39.8 05/14/2010 0934   PLT 213.0 05/14/2010 0934   MCV 92.3 05/14/2010 0934   MCHC 34.5 05/14/2010 0934   RDW 12.7 05/14/2010 0934   LYMPHSABS 2.7 05/14/2010 0934   MONOABS 0.5 05/14/2010 0934   EOSABS 0.5 05/14/2010 0934   BASOSABS 0.0 05/14/2010 0934   BMET    Component Value Date/Time   NA 142 09/02/2010 1330   K 4.3 09/02/2010 1330   CL 105 09/02/2010 1330   CO2 31 09/02/2010 1330   GLUCOSE 87 09/02/2010 1330   BUN 11 09/02/2010 1330   CREATININE 0.81 09/02/2010 1330   CALCIUM 9.7 09/02/2010 1330   GFRNONAA >60 09/02/2010 1330   GFRAA >60 09/02/2010 1330       No data to display         [facility] CMET 01/28/22 Glu 99 BUN/Cr 19/1.16 GFR 50 Na 140 K 5 Cl 105 CO2 29 AG 10.9 Ca 10.1 Albumin 4.5 ALP 62 AST 19 ALT 22   Assessment & Plan:   Discussion: 73 year old female with remote hx of extrapulmonary sarcoid involving eye, asthma and laryngeal pharyngeal reflux who presents for follow-up. We reviewed clinical course of COVID-19 including long-term complications including post-inflammatory lung disease and long hauler symptoms. PFTs with resolved restrictive defect and stable DLCO which is mildly reduced.   Persistent symptoms. Encouraged to take two puffs in morning and 1-2 puffs in evening for symptom controlled. This may affect your singing voice. If bronchodilators are maximized and  still symptomatic, may need to rule out sarcoid relapse.  COVID long hauler - dx in July 2023 Intermittent asthma - symptomatic. Will step up --STOP Alvesco  80 mcg  --STOP Wixela due to lactose allergy --CONTINUE Advair  HFA 115-21 mcg TWO puffs in the morning and evening. REFILLED --CONTINUE montelukast 10 mg daily --CONTINUE prevacid  30 mg daily --Completed pulmonary rehab in 04/2022 --RESTART 3-5 days exercise per week  Reduced DLCO - stable Mild restrictive defect - resolved Reviewed PFTs. Resolved restrictive defect. Stable DLCO Plan for repeat PFTs in 1 year. ORDER If decreasing in the future, will need further work-up including CT chest and echocardiogram  REM sleep behavior disorder --On melatonin --Followed by Dr. Neysa  Fatigue - improved --Hold on PET/CT sarcoid  History of sarcoid with eye involvement --Diagnosed by her ophthalmologist >10 years ago --No indication for prednisone  therapy --Annual PFTs. Last 07/28/21 --Recommend annual ophthalmology exam. Last exam: [facility] 05/11/22 - No sarcoid findings --EKG showed sinus bradycardia.   Sinus bradycardia --EKG reviewed and confirmed SB  Laryngopharyngeal Reflux --Previously [facility] with Dr. Carlie --Continue Lansoprazole    Health Maintenance Immunization History  Administered Date(s) Administered   Fluad Trivalent(High Dose 65+) 12/23/2022   Influenza Split 02/24/2009, 01/14/2014, 12/14/2014, 01/15/2015, 12/11/2015, 11/30/2016, 11/19/2017, 01/01/2019, 12/06/2019   Influenza Whole 01/13/2009, 12/22/2009   Influenza, High Dose Seasonal PF 12/31/2020   Influenza-Unspecified 01/21/2011   PFIZER(Purple Top)SARS-COV-2 Vaccination 04/19/2019, 05/10/2019, 12/24/2019, 12/15/2022   Pneumococcal Conjugate-13 04/17/2014   Pneumococcal Polysaccharide-23 04/17/2015   Td 06/15/2005   Tdap 09/11/2013   Zoster, Live 10/14/2014, 12/31/2020, 04/02/2021   CT Lung Screen - not qualified. Never  smoker  Orders Placed This Encounter  Procedures   Pulmonary function test    Standing Status:   Future    Expiration Date:   03/16/2024    Where should this test be performed?:   [facility]    Full PFT: includes the following: basic spirometry, spirometry pre & post bronchodilator, diffusion capacity (DLCO), lung volumes:   Full PFT   Meds ordered this encounter  Medications   ADVAIR  HFA 115-21 MCG/ACT inhaler    Sig: Inhale 2 puffs into the lungs 2 (two) times daily.    Dispense:  1 each    Refill:  11    Brand name only   lansoprazole  (PREVACID ) 30 MG capsule    Sig: Take 1 capsule (30 mg total) by mouth daily at 12 noon.    Dispense:  30 capsule    Refill:  5    Return in about 9 months (around 12/15/2023) for after PFT.  I have spent a total time of 32-minutes on the day of the appointment including chart review, data review, collecting history, coordinating care and discussing medical diagnosis and plan with the patient/family. Past medical history, allergies, medications were reviewed. Pertinent imaging, labs and tests included in this note have been reviewed and interpreted independently by me.  Alyissa Whidbee Slater Staff, MD [facility] 03/17/2023 10:18 AM  Office Number [phone]

## 2023-10-03 ENCOUNTER — Other Ambulatory Visit (HOSPITAL_BASED_OUTPATIENT_CLINIC_OR_DEPARTMENT_OTHER): Payer: Self-pay | Admitting: Pulmonary Disease

## 2024-01-02 ENCOUNTER — Other Ambulatory Visit: Payer: Self-pay | Admitting: Obstetrics and Gynecology

## 2024-01-02 ENCOUNTER — Encounter: Payer: Self-pay | Admitting: Obstetrics and Gynecology

## 2024-01-02 DIAGNOSIS — Z01419 Encounter for gynecological examination (general) (routine) without abnormal findings: Secondary | ICD-10-CM

## 2024-01-12 ENCOUNTER — Other Ambulatory Visit

## 2024-03-31 ENCOUNTER — Other Ambulatory Visit (HOSPITAL_BASED_OUTPATIENT_CLINIC_OR_DEPARTMENT_OTHER): Payer: Self-pay | Admitting: Pulmonary Disease
